# Patient Record
Sex: Female | Born: 1995 | Race: Black or African American | Hispanic: No | Marital: Single | Smoking: Never smoker
Health system: Southern US, Community
[De-identification: ages and names within clinical notes are randomized; demographics above are authoritative.]

## PROBLEM LIST (undated history)

## (undated) ENCOUNTER — Inpatient Hospital Stay (HOSPITAL_COMMUNITY): Payer: Self-pay

## (undated) DIAGNOSIS — J0391 Acute recurrent tonsillitis, unspecified: Secondary | ICD-10-CM

## (undated) DIAGNOSIS — Z309 Encounter for contraceptive management, unspecified: Secondary | ICD-10-CM

## (undated) DIAGNOSIS — O039 Complete or unspecified spontaneous abortion without complication: Secondary | ICD-10-CM

## (undated) DIAGNOSIS — Z3490 Encounter for supervision of normal pregnancy, unspecified, unspecified trimester: Principal | ICD-10-CM

## (undated) DIAGNOSIS — Z3491 Encounter for supervision of normal pregnancy, unspecified, first trimester: Principal | ICD-10-CM

## (undated) DIAGNOSIS — J40 Bronchitis, not specified as acute or chronic: Secondary | ICD-10-CM

## (undated) DIAGNOSIS — R569 Unspecified convulsions: Secondary | ICD-10-CM

## (undated) HISTORY — DX: Encounter for supervision of normal pregnancy, unspecified, first trimester: Z34.91

## (undated) HISTORY — DX: Encounter for contraceptive management, unspecified: Z30.9

## (undated) HISTORY — DX: Encounter for supervision of normal pregnancy, unspecified, unspecified trimester: Z34.90

## (undated) HISTORY — PX: HERNIA REPAIR: SHX51

---
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
## 2013-01-13 ENCOUNTER — Emergency Department (HOSPITAL_COMMUNITY)
Admission: EM | Admit: 2013-01-13 | Discharge: 2013-01-13 | Disposition: A | Discharge status: Home / Self Care | Payer: Medicaid Other | Attending: Emergency Medicine | Admitting: Emergency Medicine

## 2013-01-13 ENCOUNTER — Encounter (HOSPITAL_COMMUNITY): Payer: Self-pay | Admitting: *Deleted

## 2013-01-13 DIAGNOSIS — R000 Tachycardia, unspecified: Secondary | ICD-10-CM | POA: Insufficient documentation

## 2013-01-13 DIAGNOSIS — J039 Acute tonsillitis, unspecified: Secondary | ICD-10-CM | POA: Insufficient documentation

## 2013-01-13 DIAGNOSIS — R509 Fever, unspecified: Secondary | ICD-10-CM | POA: Insufficient documentation

## 2013-01-13 DIAGNOSIS — R599 Enlarged lymph nodes, unspecified: Secondary | ICD-10-CM | POA: Insufficient documentation

## 2013-01-13 LAB — RAPID STREP SCREEN (MED CTR MEBANE ONLY): Streptococcus, Group A Screen (Direct): NEGATIVE

## 2013-01-13 MED ORDER — PENICILLIN V POTASSIUM 250 MG/5ML PO SOLR: 500.0000 mg | Freq: Three times a day (TID) | ORAL | Status: AC

## 2013-01-13 NOTE — ED Notes (Signed)
Sore throat for 2-3 days. Alert.

## 2013-01-13 NOTE — ED Provider Notes (Signed)
History     CSN: [id number]  Arrival date & time 01/13/13  1734   First MD Initiated Contact with Patient 01/13/13 1920      Chief Complaint  Patient presents with  . Sore Throat    (Consider location/radiation/quality/duration/timing/severity/associated sxs/prior treatment) Patient is a 17 y.o. female presenting with pharyngitis. The history is provided by the patient.  Sore Throat This is a new problem. The current episode started in the past 7 days. The problem occurs constantly. The problem has been gradually worsening. Associated symptoms include chills, a fever, a sore throat and swollen glands. Pertinent negatives include no abdominal pain, anorexia, change in bowel habit, chest pain, congestion, coughing, headaches, nausea, neck pain, rash or vomiting. The symptoms are aggravated by eating.    History reviewed. No pertinent past medical history.  Past Surgical History  Procedure Laterality Date  . Hernia repair      History reviewed. No pertinent family history.  History  Substance Use Topics  . Smoking status: Never Smoker   . Smokeless tobacco: Not on file  . Alcohol Use: No    OB History   Grav Para Term Preterm Abortions TAB SAB Ect Mult Living                  Review of Systems  Constitutional: Positive for fever and chills.  HENT: Positive for sore throat. Negative for congestion, neck pain and neck stiffness.   Respiratory: Negative for cough.   Cardiovascular: Negative for chest pain.  Gastrointestinal: Negative for nausea, vomiting, abdominal pain, anorexia and change in bowel habit.  Genitourinary: Negative for urgency and frequency.  Musculoskeletal: Negative for back pain.  Skin: Negative for rash.  Neurological: Negative for headaches.  Psychiatric/Behavioral: The patient is not nervous/anxious.     Allergies  Review of patient's allergies indicates no known allergies.  Home Medications  No current outpatient prescriptions on file.  BP  122/73  Pulse 129  Temp(Src) 100.7 F (38.2 C) (Oral)  Resp 20  Ht 5\' 5"  (1.651 m)  Wt 155 lb (70.308 kg)  BMI 25.79 kg/m2  SpO2 100%  LMP 12/27/2012  Physical Exam  Nursing note and vitals reviewed. Constitutional: She is oriented to person, place, and time. She appears well-developed and well-nourished. No distress.  HENT:  Head: Normocephalic and atraumatic.  Right Ear: Tympanic membrane normal.  Left Ear: Tympanic membrane normal.  Nose: Nose normal.  Mouth/Throat: Uvula is midline and mucous membranes are normal. Oropharyngeal exudate and posterior oropharyngeal erythema present.  Tonsils enlarged with exudate  Eyes: EOM are normal.  Neck: Neck supple.  Cardiovascular: Tachycardia present.   Pulmonary/Chest: Effort normal and breath sounds normal.  Abdominal: Soft. There is no tenderness.  Musculoskeletal: Normal range of motion.  Neurological: She is alert and oriented to person, place, and time. No cranial nerve deficit.  Skin: Skin is warm and dry.  Psychiatric: She has a normal mood and affect. Her behavior is normal.    ED Course  Procedures (including critical care time) Results for orders placed during the hospital encounter of 01/13/13 (from the past 24 hour(s))  RAPID STREP SCREEN     Status: None   Collection Time    01/13/13  5:57 PM      Result Value Range   Streptococcus, Group A Screen (Direct) NEGATIVE  NEGATIVE     MDM  17 y.o. female with sore throat x 2 days with fever. I have reviewed this patient's vital signs, nurses notes, labs and  discussed findings with the patient and her mother. Discussed with the patient plan of care and all questioned fully answered. I discussed that the rapid strep was negative but it would be sent for culture. Will start antibiotics since patient has exudate and fever. I discussed with the patient that if symptoms persist she may need to be tested for Mono.  She will return if any problems arise.  She request liquid  antibiotics. She will take liquid motrin for pain and fever.   Medication List    TAKE these medications       penicillin v potassium 250 MG/5ML solution  Commonly known as:  VEETID  Take 10 mLs (500 mg total) by mouth 3 (three) times daily.            [facility] Orlene Och, [place] 01/13/13 1941

## 2013-01-14 NOTE — ED Provider Notes (Signed)
Medical screening examination/treatment/procedure(s) were performed by non-physician practitioner and as supervising physician I was immediately available for consultation/collaboration.  Donnetta Hutching, MD 01/14/13 [phone]

## 2013-01-16 LAB — CULTURE, GROUP A STREP

## 2013-01-17 NOTE — ED Notes (Signed)
Post ED Visit - Positive Culture Follow-up  Culture report reviewed by antimicrobial stewardship pharmacist: []  Wes Dulaney, Pharm.D., BCPS []  Celedonio Miyamoto, Pharm.D., BCPS [x]  Georgina Pillion, Pharm.D., BCPS []  [place], [address].D., BCPS, AAHIVP []  Estella Husk, Pharm.D., BCPS, AAHIVP  Positive Group A strep culture Treated with PCN V Potassium organism sensitive to the same and no further patient follow-up is required at this time.  Larena Sox 01/17/2013, 1:44 PM

## 2013-09-21 ENCOUNTER — Emergency Department (HOSPITAL_COMMUNITY)
Admission: EM | Admit: 2013-09-21 | Discharge: 2013-09-21 | Disposition: A | Discharge status: Home / Self Care | Payer: Medicaid Other | Attending: Emergency Medicine | Admitting: Emergency Medicine

## 2013-09-21 ENCOUNTER — Encounter (HOSPITAL_COMMUNITY): Payer: Self-pay | Admitting: Emergency Medicine

## 2013-09-21 DIAGNOSIS — J039 Acute tonsillitis, unspecified: Secondary | ICD-10-CM

## 2013-09-21 MED ORDER — AMOXICILLIN 250 MG/5ML PO SUSR
500.0000 mg | Freq: Once | ORAL | Status: AC
  Administered 2013-09-21: 500 mg via ORAL
  Filled 2013-09-21: qty 10

## 2013-09-21 MED ORDER — AMOXICILLIN 250 MG/5ML PO SUSR: 500.0000 mg | Freq: Three times a day (TID) | ORAL | Status: AC

## 2013-09-21 NOTE — ED Notes (Signed)
Pt c/o sore throat that started a few days ago, denies any fever,

## 2013-09-21 NOTE — Discharge Instructions (Signed)
Tonsillitis °Tonsillitis is an infection of the throat. This infection causes the tonsils to become red, tender, and puffy (swollen). Tonsils are groups of tissue at the back of your throat. If bacteria caused your infection, antibiotic medicine will be given to you. Sometimes symptoms of tonsillitis can be relieved with the use of steroid medicine. If your tonsillitis is severe and happens often, you may need to get your tonsils removed (tonsillectomy). °HOME CARE  °· Rest and sleep often. °· Drink enough fluids to keep your pee (urine) clear or pale yellow. °· While your throat is sore, eat soft or liquid foods like: °· Soup. °· Ice cream. °· Instant breakfast drinks. °· Eat frozen ice pops. °· Gargle with a warm or cold liquid to help soothe the throat. Gargle with a water and salt mix. Mix 1/4 teaspoon of salt and 1/4 teaspoon of baking soda in 1 cup of water. °· Only take medicines as told by your doctor. °· If you are given medicines (antibiotics), take them as told. Finish them even if you start to feel better. °GET HELP RIGHT AWAY IF:  °· You throw up (vomit). °· You have a very bad headache. °· You have a stiff neck. °· You have chest pain. °· You have trouble breathing or swallowing. °· You have bad throat pain, drooling, or your voice changes. °· You have bad pain not helped by medicine. °· You cannot fully open your mouth. °· You have redness, puffiness, or bad pain in the neck. °· You have a fever. °· You have a rash. °· You cough up green, yellow-brown, or bloody fluid. °· You cannot swallow liquids or food for 24 hours. °· You notice that only one of your tonsils is swollen. °MAKE SURE YOU:  °· Understand these instructions. °· Will watch your condition. °· Will get help right away if you are not doing well or get worse. °Document Released: 01/17/2008 Document Revised: 04/02/2013 Document Reviewed: 01/17/2013 °Exit[employer]® Patient Information ©2014 [employer]. ° °

## 2013-09-21 NOTE — ED Provider Notes (Signed)
CSN: [id number]     Arrival date & time 09/21/13  1613 History  This chart was scribed for non-physician practitioner, Evalee Jefferson, PA-C,working with Ezequiel Essex, MD, by Marlowe Kays, ED Scribe.  This patient was seen in room APFT22/APFT[facility] and the patient's care was started at 4:49 PM.  Chief Complaint  Patient presents with  . Sore Throat   The history is provided by the patient. No language interpreter was used.   HPI Comments:  Joan Andrews is a 18 y.o. female who presents to the Emergency Department complaining of scratchy sore throat that start approximately two days ago. Pt denies any sick contacts. Pt states she has taken some liquid OTC Tylenol with moderate relief. Pt states she has been eating and drinking normally. She denies coughing,shortness of breath, chest pain, headache, neck pain or stiffness,  fever, ear pain, or dental pain. She denies any chronic medical issues and is generally healthy.   History reviewed. No pertinent past medical history. Past Surgical History  Procedure Laterality Date  . Hernia repair     No family history on file. History  Substance Use Topics  . Smoking status: Never Smoker   . Smokeless tobacco: Not on file  . Alcohol Use: No   OB History   Grav Para Term Preterm Abortions TAB SAB Ect Mult Living                 Review of Systems  Constitutional: Negative for fever and appetite change.  HENT: Positive for sore throat. Negative for congestion.   Eyes: Negative.   Respiratory: Negative for chest tightness and shortness of breath.   Cardiovascular: Negative for chest pain.  Gastrointestinal: Negative for nausea and abdominal pain.  Genitourinary: Negative.   Musculoskeletal: Negative for arthralgias, joint swelling and neck pain.  Skin: Negative.  Negative for rash and wound.  Neurological: Negative for dizziness, weakness, light-headedness, numbness and headaches.  Psychiatric/Behavioral: Negative.   All other systems  reviewed and are negative.    Allergies  Review of patient's allergies indicates no known allergies.  Home Medications   Current Outpatient Rx  Name  Route  Sig  Dispense  Refill  . amoxicillin (AMOXIL) 250 MG/5ML suspension   Oral   Take 10 mLs (500 mg total) by mouth 3 (three) times daily.   300 mL   0    Triage Vitals: BP 135/93  Pulse 110  Temp(Src) 98.1 F (36.7 C) (Oral)  Resp 20  Ht 5\' 5"  (1.651 m)  Wt 167 lb (75.751 kg)  BMI 27.79 kg/m2  SpO2 99%  LMP 09/15/2013 Physical Exam  Nursing note and vitals reviewed. Constitutional: She appears well-developed and well-nourished.  HENT:  Head: Normocephalic and atraumatic.  Right Ear: Hearing, tympanic membrane, external ear and ear canal normal.  Left Ear: Hearing, tympanic membrane, external ear and ear canal normal.  Nose: Nose normal. No mucosal edema or rhinorrhea.  Mouth/Throat: Uvula is midline and mucous membranes are normal. Oropharyngeal exudate and posterior oropharyngeal erythema present. No posterior oropharyngeal edema or tonsillar abscesses.  Tonsils equally edematous and erythematous bilaterally with exudate. Uvula midline, no peritonsillar abscess appreciated.    Eyes: Conjunctivae are normal.  Neck: Normal range of motion.  Cardiovascular: Normal rate, regular rhythm, normal heart sounds and intact distal pulses.   Pulmonary/Chest: Effort normal and breath sounds normal. She has no wheezes.  Abdominal: Soft. Bowel sounds are normal. There is no tenderness.  Musculoskeletal: Normal range of motion.  Lymphadenopathy:  Head (right side): Tonsillar adenopathy present.       Head (left side): Tonsillar adenopathy present.  Neurological: She is alert.  Skin: Skin is warm and dry.  Psychiatric: She has a normal mood and affect.    ED Course  Procedures (including critical care time) DIAGNOSTIC STUDIES: Oxygen Saturation is 99% on RA, normal by my interpretation.   COORDINATION OF CARE: 4:56  PM- Will prescribe Amoxicillin. Pt verbalizes understanding and agrees to plan.  Medications  amoxicillin (AMOXIL) 250 MG/5ML suspension 500 mg (500 mg Oral Given 09/21/13 1700)    Labs Review Labs Reviewed - No data to display Imaging Review No results found.  EKG Interpretation   None       MDM   1. Tonsillitis with exudate    Pt reports unable to tolerate swallowing pills (chronically).  She was given liquid amoxil , prescription for same.  Encouraged rest, increased fluid intake,  Tylenol or motrin for pain reduction.  Recheck for any worsened sx.  Pt understands plan as do family at bedside.  I personally performed the services described in this documentation, which was scribed in my presence. The recorded information has been reviewed and is accurate.    Evalee Jefferson, PA-C 09/23/13 1500

## 2013-09-24 NOTE — ED Provider Notes (Signed)
Medical screening examination/treatment/procedure(s) were performed by non-physician practitioner and as supervising physician I was immediately available for consultation/collaboration.  EKG Interpretation   None        Ezequiel Essex, MD 09/24/13 1028

## 2013-12-08 ENCOUNTER — Encounter: Payer: Self-pay | Admitting: Adult Health

## 2013-12-26 ENCOUNTER — Encounter: Payer: Self-pay | Admitting: Adult Health

## 2013-12-26 ENCOUNTER — Ambulatory Visit (INDEPENDENT_AMBULATORY_CARE_PROVIDER_SITE_OTHER): Payer: Medicaid Other | Admitting: Adult Health

## 2013-12-26 ENCOUNTER — Other Ambulatory Visit (HOSPITAL_COMMUNITY)
Admission: RE | Admit: 2013-12-26 | Discharge: 2013-12-26 | Disposition: A | Discharge status: Home / Self Care | Payer: Medicaid Other | Source: Ambulatory Visit | Attending: Adult Health | Admitting: Adult Health

## 2013-12-26 VITALS — BP 112/70 | HR 82 | Ht 64.5 in | Wt 159.5 lb

## 2013-12-26 DIAGNOSIS — Z3049 Encounter for surveillance of other contraceptives: Secondary | ICD-10-CM

## 2013-12-26 DIAGNOSIS — Z1151 Encounter for screening for human papillomavirus (HPV): Secondary | ICD-10-CM | POA: Insufficient documentation

## 2013-12-26 DIAGNOSIS — R87810 Cervical high risk human papillomavirus (HPV) DNA test positive: Secondary | ICD-10-CM | POA: Insufficient documentation

## 2013-12-26 DIAGNOSIS — Z01419 Encounter for gynecological examination (general) (routine) without abnormal findings: Secondary | ICD-10-CM

## 2013-12-26 DIAGNOSIS — Z124 Encounter for screening for malignant neoplasm of cervix: Secondary | ICD-10-CM | POA: Insufficient documentation

## 2013-12-26 DIAGNOSIS — Z309 Encounter for contraceptive management, unspecified: Secondary | ICD-10-CM

## 2013-12-26 DIAGNOSIS — Z113 Encounter for screening for infections with a predominantly sexual mode of transmission: Secondary | ICD-10-CM | POA: Insufficient documentation

## 2013-12-26 HISTORY — DX: Encounter for contraceptive management, unspecified: Z30.9

## 2013-12-26 MED ORDER — NORELGESTROMIN-ETH ESTRADIOL 150-35 MCG/24HR TD PTWK: 1.0000 | MEDICATED_PATCH | TRANSDERMAL | Status: DC

## 2013-12-26 NOTE — Patient Instructions (Signed)

## 2013-12-26 NOTE — Progress Notes (Signed)
Patient ID: Joan Andrews, female   DOB: 29-Jan-1996, 18 y.o.   MRN: [id number] History of Present Illness:  Joan Andrews is a 18 year old black female, single in for a pap and physical with family planning medicaid and she wants to get on the patch for birth control.She is new to this practice.  Current Medications, Allergies, Past Medical History, Past Surgical History, Family History and Social History were reviewed in [employer] record.     Review of Systems: Patient denies any headaches, blurred vision, shortness of breath, chest pain, abdominal pain, problems with bowel movements, urination, or intercourse. No joint pain or mood swings.    Physical Exam:BP 112/70  Pulse 82  Ht 5' 4.5" (1.638 m)  Wt 159 lb 8 oz (72.349 kg)  BMI 26.97 kg/m2  LMP 12/05/2013 General:  Well developed, well nourished, no acute distress Skin:  Warm and dry Neck:  Midline trachea, normal thyroid Lungs; Clear to auscultation bilaterally Breast:  No dominant palpable mass, retraction, or nipple discharge Cardiovascular: Regular rate and rhythm Abdomen:  Soft, non tender, no hepatosplenomegaly, has scar at navel where had hernia repair. Pelvic:  External genitalia is normal in appearance.  The vagina is normal in appearance. The cervix is smooth and tiny, pap with G/CHL performed.Marland Kitchen  Uterus is felt to be normal size, shape, and contour.  No  adnexal masses or tenderness noted. Extremities:  No swelling or varicosities noted Psych:  No mood changes, alert and cooperative, seems happy Discussed the patch and other Birth control options and she wants the patch, she is aware of risk and benefits.   Impression: Yearly gyn exam family planning medicaid Contraceptive mangement STD testing    Plan: Check HIV,RPR,HSV 2 Rx ortho evra patch x 1 year Follow up in 3 months Physical in 1 year Review handout on patch

## 2013-12-27 LAB — HIV ANTIBODY (ROUTINE TESTING W REFLEX): HIV 1&2 Ab, 4th Generation: NONREACTIVE

## 2013-12-27 LAB — RPR

## 2013-12-29 LAB — HSV 2 ANTIBODY, IGG

## 2013-12-31 ENCOUNTER — Telehealth: Payer: Self-pay

## 2013-12-31 NOTE — Telephone Encounter (Signed)
Spoke with pt. All labs were negative. Pt wants to come by and get a copy for work. Advised can get that at the front desk. [place]

## 2014-01-06 ENCOUNTER — Telehealth: Payer: Self-pay | Admitting: Adult Health

## 2014-01-06 NOTE — Telephone Encounter (Signed)
Left message to call about.

## 2014-01-12 ENCOUNTER — Telehealth: Payer: Self-pay

## 2014-01-12 NOTE — Telephone Encounter (Signed)
Pt states started new patch for birth control 12/26/2013 now having vaginal bleeding. Informed pt can have breakthrough bleeding when starting a new birth control continue to monitor if no improvement call our office back. Pt verbalized understanding.

## 2014-01-19 ENCOUNTER — Telehealth: Payer: Self-pay | Admitting: *Deleted

## 2014-01-19 NOTE — Telephone Encounter (Signed)
Pt states that this is her first time on [facility] and she is on the patch. Pt states that she has been bleeding for a little over 2 weeks. Pt saw JAG. PT advised that I would speak to [place] and then call her back. Pt verblaized understanding.

## 2014-01-19 NOTE — Telephone Encounter (Signed)
LMOM x1 AMT

## 2014-01-19 NOTE — Telephone Encounter (Signed)
I spoke with JAG and she advised that the pt should change the patch every week for 3 weeks and ten have no patch for a week, place the new patch on at the end of her period and then repeat the process. JAG also advised that the pt should use the patches for at least 3 months and then we would make a decision. I will call and inform the pt of this.   Pt advised of above and she verbalized understanding.

## 2014-03-31 ENCOUNTER — Ambulatory Visit: Payer: Medicaid Other | Admitting: Adult Health

## 2014-04-14 ENCOUNTER — Emergency Department (HOSPITAL_COMMUNITY)
Admission: EM | Admit: 2014-04-14 | Discharge: 2014-04-14 | Disposition: A | Discharge status: Home / Self Care | Payer: Medicaid Other | Attending: Emergency Medicine | Admitting: Emergency Medicine

## 2014-04-14 ENCOUNTER — Encounter (HOSPITAL_COMMUNITY): Payer: Self-pay | Admitting: Emergency Medicine

## 2014-04-14 DIAGNOSIS — J029 Acute pharyngitis, unspecified: Secondary | ICD-10-CM | POA: Insufficient documentation

## 2014-04-14 DIAGNOSIS — J039 Acute tonsillitis, unspecified: Secondary | ICD-10-CM

## 2014-04-14 LAB — CBC WITH DIFFERENTIAL/PLATELET
Basophils Absolute: 0 10*3/uL (ref 0.0–0.1)
Basophils Relative: 0 % (ref 0–1)
Eosinophils Absolute: 0 10*3/uL (ref 0.0–0.7)
Eosinophils Relative: 0 % (ref 0–5)
HEMATOCRIT: 31.5 % — AB (ref 36.0–46.0)
HEMOGLOBIN: 10.7 g/dL — AB (ref 12.0–15.0)
LYMPHS ABS: 1.4 10*3/uL (ref 0.7–4.0)
LYMPHS PCT: 11 % — AB (ref 12–46)
MCH: 27.6 pg (ref 26.0–34.0)
MCHC: 34 g/dL (ref 30.0–36.0)
MCV: 81.2 fL (ref 78.0–100.0)
MONO ABS: 1.1 10*3/uL — AB (ref 0.1–1.0)
MONOS PCT: 8 % (ref 3–12)
NEUTROS ABS: 10.5 10*3/uL — AB (ref 1.7–7.7)
NEUTROS PCT: 81 % — AB (ref 43–77)
Platelets: 189 10*3/uL (ref 150–400)
RBC: 3.88 MIL/uL (ref 3.87–5.11)
RDW: 13.7 % (ref 11.5–15.5)
WBC: 13 10*3/uL — AB (ref 4.0–10.5)

## 2014-04-14 LAB — RAPID STREP SCREEN (MED CTR MEBANE ONLY): Streptococcus, Group A Screen (Direct): NEGATIVE

## 2014-04-14 LAB — MONONUCLEOSIS SCREEN: Mono Screen: NEGATIVE

## 2014-04-14 MED ORDER — CEPHALEXIN 500 MG PO CAPS: 500.0000 mg | ORAL_CAPSULE | Freq: Four times a day (QID) | ORAL | Status: DC

## 2014-04-14 MED ORDER — ACETAMINOPHEN 160 MG/5ML PO SOLN
650.0000 mg | Freq: Once | ORAL | Status: AC
  Administered 2014-04-14: 650 mg via ORAL
  Filled 2014-04-14: qty 20.3

## 2014-04-14 MED ORDER — PREDNISONE 10 MG PO TABS: 20.0000 mg | ORAL_TABLET | Freq: Two times a day (BID) | ORAL | Status: DC

## 2014-04-14 NOTE — ED Notes (Signed)
sorethroat for 3 days, seen @ [facility] yesterday and dx/d with pharyngitis, now has yellow spots on throat

## 2014-04-14 NOTE — ED Notes (Signed)
Vital signs remove put in under wrong patient.

## 2014-04-14 NOTE — Discharge Instructions (Signed)
Keflex as prescribed. Prednisone as prescribed.  Ibuprofen 600 mg every 6 hours as needed for pain.  Return to the emergency department if you develop severe pain, difficulty breathing, or an inability to swallow.   Tonsillitis Tonsillitis is an infection of the throat that causes the tonsils to become red, tender, and swollen. Tonsils are collections of lymphoid tissue at the back of the throat. Each tonsil has crevices (crypts). Tonsils help fight nose and throat infections and keep infection from spreading to other parts of the body for the first 18 months of life.  CAUSES Sudden (acute) tonsillitis is usually caused by infection with streptococcal bacteria. Long-lasting (chronic) tonsillitis occurs when the crypts of the tonsils become filled with pieces of food and bacteria, which makes it easy for the tonsils to become repeatedly infected. SYMPTOMS  Symptoms of tonsillitis include:  A sore throat, with possible difficulty swallowing.  White patches on the tonsils.  Fever.  Tiredness.  New episodes of snoring during sleep, when you did not snore before.  Small, foul-smelling, yellowish-white pieces of material (tonsilloliths) that you occasionally cough up or spit out. The tonsilloliths can also cause you to have bad breath. DIAGNOSIS Tonsillitis can be diagnosed through a physical exam. Diagnosis can be confirmed with the results of lab tests, including a throat culture. TREATMENT  The goals of tonsillitis treatment include the reduction of the severity and duration of symptoms and prevention of associated conditions. Symptoms of tonsillitis can be improved with the use of steroids to reduce the swelling. Tonsillitis caused by bacteria can be treated with antibiotic medicines. Usually, treatment with antibiotic medicines is started before the cause of the tonsillitis is known. However, if it is determined that the cause is not bacterial, antibiotic medicines will not treat the  tonsillitis. If attacks of tonsillitis are severe and frequent, your health care provider may recommend surgery to remove the tonsils (tonsillectomy). HOME CARE INSTRUCTIONS   Rest as much as possible and get plenty of sleep.  Drink plenty of fluids. While the throat is very sore, eat soft foods or liquids, such as sherbet, soups, or instant breakfast drinks.  Eat frozen ice pops.  Gargle with a warm or cold liquid to help soothe the throat. Mix 1/4 teaspoon of salt and 1/4 teaspoon of baking soda in 8 oz of water. SEEK MEDICAL CARE IF:   Large, tender lumps develop in your neck.  A rash develops.  A green, yellow-brown, or bloody substance is coughed up.  You are unable to swallow liquids or food for 24 hours.  You notice that only one of the tonsils is swollen. SEEK IMMEDIATE MEDICAL CARE IF:   You develop any new symptoms such as vomiting, severe headache, stiff neck, chest pain, or trouble breathing or swallowing.  You have severe throat pain along with drooling or voice changes.  You have severe pain, unrelieved with recommended medications.  You are unable to fully open the mouth.  You develop redness, swelling, or severe pain anywhere in the neck.  You have a fever. MAKE SURE YOU:   Understand these instructions.  Will watch your condition.  Will get help right away if you are not doing well or get worse. Document Released: 05/10/2005 Document Revised: 12/15/2013 Document Reviewed: 01/17/2013 [facility] Patient Information 2015 [place], [place]. This information is not intended to replace advice given to you by your health care provider. Make sure you discuss any questions you have with your health care provider.

## 2014-04-14 NOTE — ED Provider Notes (Signed)
CSN: [id number]     Arrival date & time 04/14/14  0444 History   First MD Initiated Contact with Patient 04/14/14 [phone]     Chief Complaint  Patient presents with  . Sore Throat     (Consider location/radiation/quality/duration/timing/severity/associated sxs/prior Treatment) HPI Comments: ST for the past 3 days.  Was seen at [facility] yesterday and had a negative strep test.  Since then, she is now having white spots on the tonsils.  Patient is a 18 y.o. female presenting with pharyngitis. The history is provided by the patient.  Sore Throat This is a new problem. Episode onset: 3 days ago. The problem occurs constantly. The problem has been gradually worsening. Pertinent negatives include no chest pain and no abdominal pain. The symptoms are aggravated by swallowing, drinking and eating. Nothing relieves the symptoms. She has tried nothing for the symptoms. The treatment provided no relief.    Past Medical History  Diagnosis Date  . Contraceptive management 12/26/2013   Past Surgical History  Procedure Laterality Date  . Hernia repair     Family History  Problem Relation Age of Onset  . Hypertension Father   . Alzheimer's disease Maternal Grandmother    History  Substance Use Topics  . Smoking status: Never Smoker   . Smokeless tobacco: Never Used  . Alcohol Use: No   OB History   Grav Para Term Preterm Abortions TAB SAB Ect Mult Living   0              Review of Systems  Cardiovascular: Negative for chest pain.  Gastrointestinal: Negative for abdominal pain.  All other systems reviewed and are negative.     Allergies  Review of patient's allergies indicates no known allergies.  Home Medications   Prior to Admission medications   Medication Sig Start Date End Date Taking? Authorizing Provider  norelgestromin-ethinyl estradiol (ORTHO EVRA) 150-35 MCG/24HR transdermal patch Place 1 patch onto the skin once a week. 12/26/13   Estill Dooms, NP   BP 171/105   Pulse 103  Temp(Src) 98.4 F (36.9 C) (Oral)  Resp 20  Ht 5\' 9"  (1.753 m)  Wt 139 lb (63.05 kg)  BMI 20.52 kg/m2  SpO2 93%  LMP 04/09/2014 Physical Exam  Nursing note and vitals reviewed. Constitutional: She is oriented to person, place, and time. She appears well-developed and well-nourished. No distress.  HENT:  Head: Normocephalic and atraumatic.  The PO is erythematous with exudates present to both tonsils.  Neck: Normal range of motion. Neck supple.  Cardiovascular: Normal rate and regular rhythm.  Exam reveals no gallop and no friction rub.   No murmur heard. Pulmonary/Chest: Effort normal and breath sounds normal. No respiratory distress. She has no wheezes.  Abdominal: Soft. Bowel sounds are normal. She exhibits no distension. There is no tenderness.  Musculoskeletal: Normal range of motion.  Lymphadenopathy:    She has cervical adenopathy.  Neurological: She is alert and oriented to person, place, and time.  Skin: Skin is warm and dry. She is not diaphoretic.    ED Course  Procedures (including critical care time) Labs Review Labs Reviewed  RAPID STREP SCREEN  CBC WITH DIFFERENTIAL  MONONUCLEOSIS SCREEN    Imaging Review No results found.   EKG Interpretation None      MDM   Final diagnoses:  None    Patient presents with complaints of sore throat. She was seen at [facility] yesterday and strep test was negative. She returns today with exudates in  her throat. Repeat strep test was negative and mono test was negative as well. She appears to have tonsillitis, likely viral but possibly bacterial. She will be treated with Keflex and when necessary return.    Veryl Speak, MD 04/14/14 [phone]

## 2014-04-16 LAB — CULTURE, GROUP A STREP

## 2014-07-14 ENCOUNTER — Ambulatory Visit (INDEPENDENT_AMBULATORY_CARE_PROVIDER_SITE_OTHER): Payer: Self-pay | Admitting: Adult Health

## 2014-07-14 ENCOUNTER — Encounter: Payer: Self-pay | Admitting: Adult Health

## 2014-07-14 VITALS — BP 110/60 | Ht 65.5 in | Wt 160.0 lb

## 2014-07-14 DIAGNOSIS — Z3490 Encounter for supervision of normal pregnancy, unspecified, unspecified trimester: Secondary | ICD-10-CM

## 2014-07-14 DIAGNOSIS — Z3201 Encounter for pregnancy test, result positive: Secondary | ICD-10-CM

## 2014-07-14 LAB — POCT URINE PREGNANCY: PREG TEST UR: POSITIVE

## 2014-07-14 NOTE — Progress Notes (Signed)
Subjective:     Patient ID: Joan Andrews, female   DOB: 04-04-1996, 18 y.o.   MRN: [id number]  HPI Joan Andrews is a 18 year old black female, engaged in for UPT.  Review of Systems See HPI Reviewed past medical,surgical, social and family history. Reviewed medications and allergies.     Objective:   Physical Exam BP 110/60 mmHg  Ht 5' 5.5" (1.664 m)  Wt 160 lb (72.576 kg)  BMI 26.21 kg/m2  LMP 06/07/2014   UPT+, about 5+2 weeks by LMP with EDD 03/15/15, medicaid form given, no complaints  Assessment:     Pregnant +UPT    Plan:     Take Flintstones complete with iron 1 bid Follow up in 2 weeks for dating [place] Get pregnancy medicaid Review handout on first trimester

## 2014-07-14 NOTE — Patient Instructions (Signed)
First Trimester of Pregnancy The first trimester of pregnancy is from week 1 until the end of week 12 (months 1 through 3). A week after a sperm fertilizes an egg, the egg will implant on the wall of the uterus. This embryo will begin to develop into a baby. Genes from you and your partner are forming the baby. The female genes determine whether the baby is a boy or a girl. At 6-8 weeks, the eyes and face are formed, and the heartbeat can be seen on ultrasound. At the end of 12 weeks, all the baby's organs are formed.  Now that you are pregnant, you will want to do everything you can to have a healthy baby. Two of the most important things are to get good prenatal care and to follow your health care provider's instructions. Prenatal care is all the medical care you receive before the baby's birth. This care will help prevent, find, and treat any problems during the pregnancy and childbirth. BODY CHANGES Your body goes through many changes during pregnancy. The changes vary from woman to woman.   You may gain or lose a couple of pounds at first.  You may feel sick to your stomach (nauseous) and throw up (vomit). If the vomiting is uncontrollable, call your health care provider.  You may tire easily.  You may develop headaches that can be relieved by medicines approved by your health care provider.  You may urinate more often. Painful urination may mean you have a bladder infection.  You may develop heartburn as a result of your pregnancy.  You may develop constipation because certain hormones are causing the muscles that push waste through your intestines to slow down.  You may develop hemorrhoids or swollen, bulging veins (varicose veins).  Your breasts may begin to grow larger and become tender. Your nipples may stick out more, and the tissue that surrounds them (areola) may become darker.  Your gums may bleed and may be sensitive to brushing and flossing.  Dark spots or blotches (chloasma,  mask of pregnancy) may develop on your face. This will likely fade after the baby is born.  Your menstrual periods will stop.  You may have a loss of appetite.  You may develop cravings for certain kinds of food.  You may have changes in your emotions from day to day, such as being excited to be pregnant or being concerned that something may go wrong with the pregnancy and baby.  You may have more vivid and strange dreams.  You may have changes in your hair. These can include thickening of your hair, rapid growth, and changes in texture. Some women also have hair loss during or after pregnancy, or hair that feels dry or thin. Your hair will most likely return to normal after your baby is born. WHAT TO EXPECT AT YOUR PRENATAL VISITS During a routine prenatal visit:  You will be weighed to make sure you and the baby are growing normally.  Your blood pressure will be taken.  Your abdomen will be measured to track your baby's growth.  The fetal heartbeat will be listened to starting around week 10 or 12 of your pregnancy.  Test results from any previous visits will be discussed. Your health care provider may ask you:  How you are feeling.  If you are feeling the baby move.  If you have had any abnormal symptoms, such as leaking fluid, bleeding, severe headaches, or abdominal cramping.  If you have any questions. Other tests   that may be performed during your first trimester include:  Blood tests to find your blood type and to check for the presence of any previous infections. They will also be used to check for low iron levels (anemia) and Rh antibodies. Later in the pregnancy, blood tests for diabetes will be done along with other tests if problems develop.  Urine tests to check for infections, diabetes, or protein in the urine.  An ultrasound to confirm the proper growth and development of the baby.  An amniocentesis to check for possible genetic problems.  Fetal screens for  spina bifida and Down syndrome.  You may need other tests to make sure you and the baby are doing well. HOME CARE INSTRUCTIONS  Medicines  Follow your health care provider's instructions regarding medicine use. Specific medicines may be either safe or unsafe to take during pregnancy.  Take your prenatal vitamins as directed.  If you develop constipation, try taking a stool softener if your health care provider approves. Diet  Eat regular, well-balanced meals. Choose a variety of foods, such as meat or vegetable-based protein, fish, milk and low-fat dairy products, vegetables, fruits, and whole grain breads and cereals. Your health care provider will help you determine the amount of weight gain that is right for you.  Avoid raw meat and uncooked cheese. These carry germs that can cause birth defects in the baby.  Eating four or five small meals rather than three large meals a day may help relieve nausea and vomiting. If you start to feel nauseous, eating a few soda crackers can be helpful. Drinking liquids between meals instead of during meals also seems to help nausea and vomiting.  If you develop constipation, eat more high-fiber foods, such as fresh vegetables or fruit and whole grains. Drink enough fluids to keep your urine clear or pale yellow. Activity and Exercise  Exercise only as directed by your health care provider. Exercising will help you:  Control your weight.  Stay in shape.  Be prepared for labor and delivery.  Experiencing pain or cramping in the lower abdomen or low back is a good sign that you should stop exercising. Check with your health care provider before continuing normal exercises.  Try to avoid standing for long periods of time. Move your legs often if you must stand in one place for a long time.  Avoid heavy lifting.  Wear low-heeled shoes, and practice good posture.  You may continue to have sex unless your health care provider directs you  otherwise. Relief of Pain or Discomfort  Wear a good support bra for breast tenderness.   Take warm sitz baths to soothe any pain or discomfort caused by hemorrhoids. Use hemorrhoid cream if your health care provider approves.   Rest with your legs elevated if you have leg cramps or low back pain.  If you develop varicose veins in your legs, wear support hose. Elevate your feet for 15 minutes, 3-4 times a day. Limit salt in your diet. Prenatal Care  Schedule your prenatal visits by the twelfth week of pregnancy. They are usually scheduled monthly at first, then more often in the last 2 months before delivery.  Write down your questions. Take them to your prenatal visits.  Keep all your prenatal visits as directed by your health care provider. Safety  Wear your seat belt at all times when driving.  Make a list of emergency phone numbers, including numbers for family, friends, the hospital, and police and fire departments. General Tips    Ask your health care provider for a referral to a local prenatal education class. Begin classes no later than at the beginning of month 6 of your pregnancy.  Ask for help if you have counseling or nutritional needs during pregnancy. Your health care provider can offer advice or refer you to specialists for help with various needs.  Do not use hot tubs, steam rooms, or saunas.  Do not douche or use tampons or scented sanitary pads.  Do not cross your legs for long periods of time.  Avoid cat litter boxes and soil used by cats. These carry germs that can cause birth defects in the baby and possibly loss of the fetus by miscarriage or stillbirth.  Avoid all smoking, herbs, alcohol, and medicines not prescribed by your health care provider. Chemicals in these affect the formation and growth of the baby.  Schedule a dentist appointment. At home, brush your teeth with a soft toothbrush and be gentle when you floss. SEEK MEDICAL CARE IF:   You have  dizziness.  You have mild pelvic cramps, pelvic pressure, or nagging pain in the abdominal area.  You have persistent nausea, vomiting, or diarrhea.  You have a bad smelling vaginal discharge.  You have pain with urination.  You notice increased swelling in your face, hands, legs, or ankles. SEEK IMMEDIATE MEDICAL CARE IF:   You have a fever.  You are leaking fluid from your vagina.  You have spotting or bleeding from your vagina.  You have severe abdominal cramping or pain.  You have rapid weight gain or loss.  You vomit blood or material that looks like coffee grounds.  You are exposed to German measles and have never had them.  You are exposed to fifth disease or chickenpox.  You develop a severe headache.  You have shortness of breath.  You have any kind of trauma, such as from a fall or a car accident. Document Released: 07/25/2001 Document Revised: 12/15/2013 Document Reviewed: 06/10/2013 Exit[employer] Patient Information 2015 [employer]. This information is not intended to replace advice given to you by your health care provider. Make sure you discuss any questions you have with your health care provider. Return in 2 weeks for dating US 

## 2014-07-20 ENCOUNTER — Encounter: Payer: Self-pay | Admitting: *Deleted

## 2014-07-20 ENCOUNTER — Telehealth: Payer: Self-pay | Admitting: Women's Health

## 2014-07-20 NOTE — Telephone Encounter (Signed)
Note written and printed, pt in our office to pick up note now.

## 2014-07-21 ENCOUNTER — Encounter (HOSPITAL_COMMUNITY): Payer: Self-pay | Admitting: Emergency Medicine

## 2014-07-21 ENCOUNTER — Emergency Department (HOSPITAL_COMMUNITY)
Admission: EM | Admit: 2014-07-21 | Discharge: 2014-07-21 | Disposition: A | Discharge status: Home / Self Care | Payer: Medicaid Other | Attending: Emergency Medicine | Admitting: Emergency Medicine

## 2014-07-21 DIAGNOSIS — J029 Acute pharyngitis, unspecified: Secondary | ICD-10-CM | POA: Insufficient documentation

## 2014-07-21 LAB — RAPID STREP SCREEN (MED CTR MEBANE ONLY): Streptococcus, Group A Screen (Direct): NEGATIVE

## 2014-07-21 MED ORDER — DEXAMETHASONE 1 MG/ML PO CONC: 10.0000 mg | Freq: Once | ORAL | Status: DC

## 2014-07-21 MED ORDER — CHLORHEXIDINE GLUCONATE 0.12 % MT SOLN: 15.0000 mL | Freq: Two times a day (BID) | OROMUCOSAL | Status: DC

## 2014-07-21 MED ORDER — ACETAMINOPHEN 160 MG/5ML PO SOLN
650.0000 mg | Freq: Once | ORAL | Status: AC
  Administered 2014-07-21: 650 mg via ORAL
  Filled 2014-07-21: qty 20.3

## 2014-07-21 MED ORDER — ACETAMINOPHEN 325 MG PO TABS: 650.0000 mg | ORAL_TABLET | Freq: Once | ORAL | Status: DC

## 2014-07-21 MED ORDER — DEXAMETHASONE 10 MG/ML FOR PEDIATRIC ORAL USE
10.0000 mg | Freq: Once | INTRAMUSCULAR | Status: AC
  Administered 2014-07-21: 10 mg via ORAL
  Filled 2014-07-21: qty 1

## 2014-07-21 NOTE — ED Provider Notes (Signed)
CSN: [id number]     Arrival date & time 07/21/14  5277 History  This chart was scribed for Carmin Muskrat, MD by Tula Nakayama, ED Scribe. This patient was seen in room APA05/APA05 and the patient's care was started at 10:21 AM.    Chief Complaint  Patient presents with  . Sore Throat   The history is provided by the patient. No language interpreter was used.   HPI Comments: Joan Andrews is a 18 y.o. female who is currently [redacted] weeks pregnant and presents to the Emergency Department complaining of constant fever and sore throat that started last night. Pt notes exudate, mild swelling, throat discomfort, and difficulty swallowing as associated symptoms. She has tried Tylenol with no relief to symptoms. Pt has a history of tonsillitis and notes that last re-occurence was 3 months ago, but reports that she has not followed up with ENT. She denies chest pain, abdominal pain and difficulty breathing as associated symptoms. No pregnancy concerns, this is her first pregnancy. She is scheduled to see obstetrics in one week.   Past Medical History  Diagnosis Date  . Contraceptive management 12/26/2013  . Pregnant 07/14/2014   Past Surgical History  Procedure Laterality Date  . Hernia repair     Family History  Problem Relation Age of Onset  . Hypertension Father   . Alzheimer's disease Maternal Grandmother    History  Substance Use Topics  . Smoking status: Never Smoker   . Smokeless tobacco: Never Used  . Alcohol Use: No   OB History    Gravida Para Term Preterm AB TAB SAB Ectopic Multiple Living   1              Review of Systems  Constitutional: Positive for fever.       Per HPI, otherwise negative  HENT: Positive for sore throat.        Per HPI, otherwise negative  Respiratory: Negative for shortness of breath.        Per HPI, otherwise negative  Cardiovascular:       Per HPI, otherwise negative  Gastrointestinal: Negative for vomiting and abdominal pain.   Endocrine:       Negative aside from HPI  Genitourinary:       Neg aside from HPI   Musculoskeletal:       Per HPI, otherwise negative  Skin: Negative.   Neurological: Negative for syncope.   Allergies  Review of patient's allergies indicates no known allergies.  Home Medications   Prior to Admission medications   Not on File   Pulse 150  Temp(Src) 102.5 F (39.2 C) (Oral)  Ht 5\' 4"  (1.626 m)  Wt 160 lb (72.576 kg)  BMI 27.45 kg/m2  SpO2 100%  LMP 06/07/2014 Physical Exam  Constitutional: She is oriented to person, place, and time. She appears well-developed and well-nourished. No distress.  HENT:  Head: Normocephalic and atraumatic.  Mouth/Throat: Oropharyngeal exudate present.  Tonsillar exudate; no asymmetry; no uvular swelling  Eyes: Conjunctivae and EOM are normal.  Neck:  Mild lymphadenopathy  Cardiovascular: Regular rhythm.   Pulmonary/Chest: Effort normal and breath sounds normal. No stridor. No respiratory distress.  Abdominal: She exhibits no distension.  Musculoskeletal: She exhibits no edema.  Lymphadenopathy:    She has no cervical adenopathy.  Neurological: She is alert and oriented to person, place, and time. No cranial nerve deficit.  Skin: Skin is warm and dry.  Psychiatric: She has a normal mood and affect.  Nursing note and  vitals reviewed.   ED Course  Procedures (including critical care time) DIAGNOSTIC STUDIES: Oxygen Saturation is 100% on RA, normal by my interpretation.    COORDINATION OF CARE: 10:23 AM Discussed treatment plan with pt at bedside and pt agreed to plan.  Labs Review Labs Reviewed  RAPID STREP SCREEN  CULTURE, GROUP A STREP   culture sent   On repeat exam the patient is well-appearing, sitting upright, states that she feels substantially better. We discussed her recurrent pharyngitis, the need to follow-up with primary care. Given the patient's discomfort, she will receive Decadron. She will also follow up with  obstetrics, and has a appointment in one week.   MDM   Patient presents with sore throat, white discharge on the posterior oropharynx, but no evidence for abscess, impending respiratory compromise. Patient improved substantially here with fluids, Decadron, Tylenol. Initial test was negative, but culture will be sent for strep pharyngitis. She was discharged in stable condition with oral medication, primary care and obstetrics follow-up.   I personally performed the services described in this documentation, which was scribed in my presence. The recorded information has been reviewed and is accurate.      Carmin Muskrat, MD 07/21/14 1308

## 2014-07-21 NOTE — Care Management Note (Signed)
ED/CM noted patient did not have health insurance and/or PCP listed in the computer. Pt sees Dr Glo Herring for her pregnancy.  Patient was given the [facility] with information on the clinics, food pantries, and the handout for new health insurance sign-up. Patient expressed appreciation for information received.

## 2014-07-21 NOTE — ED Notes (Signed)
Pt c/o sore throat and fever since yesterday.  Denies any other symptoms.  Reports took tylenol for fever last night some time before midnight.

## 2014-07-21 NOTE — ED Notes (Signed)
Notice white patches to back of throat 2 days ago. Notice foul oral smell.  Took tylenol on last night.  Pt is [redacted] weeks pregnant.

## 2014-07-21 NOTE — Discharge Instructions (Signed)
As discussed, today's evaluation has been largely reassuring.  A secondary test is performed on your throat culture, to confirm the absence of strep throat.  As discussed, it is very important that you follow up with our specialist to ascertain why you have recurrent sore throat.  In addition, it is important to follow up with her obstetrics team, as scheduled.  Return here for any concerning changes in your condition.

## 2014-07-22 ENCOUNTER — Telehealth: Payer: Self-pay | Admitting: *Deleted

## 2014-07-22 LAB — US OB COMP LESS 14 WKS

## 2014-07-22 NOTE — Telephone Encounter (Signed)
Pt c/o of cough, sore throat, runny nose, no fever, [redacted] weeks pregnant. Per Mylo Red, CNM pt can use nasal spray and gargle with warm salt water. Pt verbalized understanding.

## 2014-07-23 LAB — CULTURE, GROUP A STREP

## 2014-07-29 ENCOUNTER — Other Ambulatory Visit: Payer: Self-pay

## 2014-07-29 ENCOUNTER — Other Ambulatory Visit: Payer: Self-pay | Admitting: Adult Health

## 2014-07-29 ENCOUNTER — Telehealth: Payer: Self-pay | Admitting: Advanced Practice Midwife

## 2014-07-29 ENCOUNTER — Ambulatory Visit (INDEPENDENT_AMBULATORY_CARE_PROVIDER_SITE_OTHER): Payer: Medicaid Other

## 2014-07-29 DIAGNOSIS — O200 Threatened abortion: Secondary | ICD-10-CM

## 2014-07-29 DIAGNOSIS — Z3401 Encounter for supervision of normal first pregnancy, first trimester: Secondary | ICD-10-CM

## 2014-07-29 DIAGNOSIS — O209 Hemorrhage in early pregnancy, unspecified: Secondary | ICD-10-CM

## 2014-07-29 DIAGNOSIS — O4691 Antepartum hemorrhage, unspecified, first trimester: Secondary | ICD-10-CM

## 2014-07-29 DIAGNOSIS — Z3490 Encounter for supervision of normal pregnancy, unspecified, unspecified trimester: Secondary | ICD-10-CM

## 2014-07-29 NOTE — Addendum Note (Signed)
Addended by: Traci Sermon A on: 07/29/2014 11:17 AM   Modules accepted: Orders

## 2014-07-29 NOTE — Telephone Encounter (Signed)
Pt informed that too early to see anything on ultrasound, we are waiting for blood work to come back but to keep taking PNV.  Pt verbalized understanding.

## 2014-07-29 NOTE — Progress Notes (Signed)
U/S-No gestational Sac noted on trans-abdominal or trans-vaginal ultrasound performed, Pt states Vaginal Bleeding X 1 week which stopped yesterday, Pt had went TO [facility] when bleeding began (not sure of date)-had an ultrasound and labs performed, no results given to patient by ED per PT, We have requested the records from [place], [place] with [place] pertaining this patient, [facility] ordered [place] and ABO/RH on patient and will call patient with the results. Today's ultrasound revealed-thickened endometrium=10.90mm with debris noted within the cavity, No GS noted, cx appears closed, bilateral adnexa appears WNL, no free fluid or adnexal masses noted within the pelvis

## 2014-07-30 ENCOUNTER — Telehealth: Payer: Self-pay | Admitting: Adult Health

## 2014-07-30 LAB — ABO AND RH: RH TYPE: POSITIVE

## 2014-07-30 LAB — HCG, QUANTITATIVE, PREGNANCY: hCG, Beta Chain, Quant, S: 126 m[IU]/mL

## 2014-07-30 NOTE — Telephone Encounter (Signed)
Pt aware that [facility] has dropped from 14,197 on 12/9 to 126 yesterday and blood type O+, make appt for 2 weeks to check [facility], use condoms,she is aware this was a miscarriage

## 2014-08-13 ENCOUNTER — Other Ambulatory Visit: Payer: Medicaid Other

## 2014-11-23 ENCOUNTER — Encounter (HOSPITAL_COMMUNITY): Payer: Self-pay | Admitting: Cardiology

## 2014-11-23 ENCOUNTER — Emergency Department (HOSPITAL_COMMUNITY)
Admission: EM | Admit: 2014-11-23 | Discharge: 2014-11-23 | Disposition: A | Discharge status: Home / Self Care | Payer: Medicaid Other | Attending: Emergency Medicine | Admitting: Emergency Medicine

## 2014-11-23 DIAGNOSIS — J039 Acute tonsillitis, unspecified: Secondary | ICD-10-CM | POA: Insufficient documentation

## 2014-11-23 LAB — RAPID STREP SCREEN (MED CTR MEBANE ONLY): Streptococcus, Group A Screen (Direct): NEGATIVE

## 2014-11-23 MED ORDER — AMOXICILLIN 400 MG/5ML PO SUSR: 500.0000 mg | Freq: Two times a day (BID) | ORAL | Status: AC

## 2014-11-23 NOTE — Discharge Instructions (Signed)

## 2014-11-23 NOTE — ED Provider Notes (Signed)
CSN: [id number]     Arrival date & time 11/23/14  5852 History   First MD Initiated Contact with Patient 11/23/14 [phone]     Chief Complaint  Patient presents with  . Sore Throat     (Consider location/radiation/quality/duration/timing/severity/associated sxs/prior Treatment) HPI  This is a 19 year old female who presents with sore throat and "yellow spots on my tonsils." Patient reports recurrent tonsillitis. She states that she has had congestion and chills over the last several days. No documented fevers at home. She developed sore throat and noted spots on her tonsils. She denies any cough, shortness of breath, or chest pain. She denies any vomiting or diarrhea. She denies any difficulty swallowing or neck pain.  Past Medical History  Diagnosis Date  . Contraceptive management 12/26/2013  . Pregnant 07/14/2014   Past Surgical History  Procedure Laterality Date  . Hernia repair     Family History  Problem Relation Age of Onset  . Hypertension Father   . Alzheimer's disease Maternal Grandmother    History  Substance Use Topics  . Smoking status: Never Smoker   . Smokeless tobacco: Never Used  . Alcohol Use: No   OB History    Gravida Para Term Preterm AB TAB SAB Ectopic Multiple Living   1              Review of Systems  Constitutional: Positive for chills. Negative for fever.  HENT: Positive for sore throat. Negative for trouble swallowing and voice change.   Respiratory: Negative for cough, chest tightness and shortness of breath.   Cardiovascular: Negative for chest pain.  Gastrointestinal: Negative for nausea and vomiting.  Genitourinary: Negative for dysuria.  Neurological: Negative for headaches.  All other systems reviewed and are negative.     Allergies  Review of patient's allergies indicates no known allergies.  Home Medications   Prior to Admission medications   Medication Sig Start Date End Date Taking? Authorizing Provider  amoxicillin (AMOXIL)  400 MG/5ML suspension Take 6.3 mLs (500 mg total) by mouth 2 (two) times daily. 11/23/14 11/30/14  Merryl Hacker, MD  chlorhexidine (PERIDEX) 0.12 % solution Use as directed 15 mLs in the mouth or throat 2 (two) times daily. Patient not taking: Reported on 11/23/2014 07/21/14   Carmin Muskrat, MD   BP 110/76 mmHg  Pulse 102  Temp(Src) 98.6 F (37 C) (Oral)  Resp 18  Ht 5\' 4"  (1.626 m)  Wt 167 lb (75.751 kg)  BMI 28.65 kg/m2  SpO2 99%  LMP 11/05/2014  Breastfeeding? Unknown Physical Exam  Constitutional: She is oriented to person, place, and time. She appears well-developed and well-nourished. No distress.  HENT:  Head: Normocephalic and atraumatic.  Tonsillar exudate noted on the right tonsil, no asymmetric tonsillar enlargement, uvula midline  Eyes: Pupils are equal, round, and reactive to light.  Cardiovascular: Normal rate, regular rhythm and normal heart sounds.   No murmur heard. Pulmonary/Chest: Effort normal and breath sounds normal. No respiratory distress. She has no wheezes.  Abdominal: Soft. Bowel sounds are normal. There is no tenderness. There is no rebound.  Lymphadenopathy:    She has cervical adenopathy.  Neurological: She is alert and oriented to person, place, and time.  Skin: Skin is warm and dry.  Psychiatric: She has a normal mood and affect.  Nursing note and vitals reviewed.   ED Course  Procedures (including critical care time) Labs Review Labs Reviewed  RAPID STREP SCREEN  CULTURE, GROUP A STREP    Imaging Review  No results found.   EKG Interpretation None      MDM   Final diagnoses:  Tonsillitis    Patient presents with sore throat and tonsillar exudate. Subjective fevers and adenopathy.  Nontoxic on exam. No evidence of deep space infection. Will send a strep screen. Will elect to treat given she is Centor criteria 3/4 (no documented fevers).  Patient would like oral medication and can only tolerate liquid. Patient will be given 500  mg amoxicillin twice a day. Strep screen negative. Culture pending.  After history, exam, and medical workup I feel the patient has been appropriately medically screened and is safe for discharge home. Pertinent diagnoses were discussed with the patient. Patient was given return precautions.     Merryl Hacker, MD 11/23/14 1016

## 2014-11-23 NOTE — ED Notes (Signed)
Noticing yellow spots on her tonsils.  States she has not felt good for a couple of days.

## 2014-11-25 LAB — CULTURE, GROUP A STREP: STREP A CULTURE: NEGATIVE

## 2014-12-10 ENCOUNTER — Other Ambulatory Visit (HOSPITAL_COMMUNITY): Payer: Self-pay | Admitting: Otolaryngology

## 2014-12-22 ENCOUNTER — Encounter (HOSPITAL_COMMUNITY): Payer: Self-pay

## 2014-12-22 ENCOUNTER — Encounter (HOSPITAL_COMMUNITY)
Admission: RE | Admit: 2014-12-22 | Discharge: 2014-12-22 | Disposition: A | Discharge status: Home / Self Care | Payer: Self-pay | Source: Ambulatory Visit | Attending: Otolaryngology | Admitting: Otolaryngology

## 2014-12-22 DIAGNOSIS — Z01812 Encounter for preprocedural laboratory examination: Secondary | ICD-10-CM | POA: Insufficient documentation

## 2014-12-22 DIAGNOSIS — Z539 Procedure and treatment not carried out, unspecified reason: Secondary | ICD-10-CM | POA: Insufficient documentation

## 2014-12-22 HISTORY — DX: Complete or unspecified spontaneous abortion without complication: O03.9

## 2014-12-22 HISTORY — DX: Bronchitis, not specified as acute or chronic: J40

## 2014-12-22 HISTORY — DX: Acute recurrent tonsillitis, unspecified: J03.91

## 2014-12-22 LAB — CBC
HCT: 36.2 % (ref 36.0–46.0)
Hemoglobin: 11.9 g/dL — ABNORMAL LOW (ref 12.0–15.0)
MCH: 27 pg (ref 26.0–34.0)
MCHC: 32.9 g/dL (ref 30.0–36.0)
MCV: 82.1 fL (ref 78.0–100.0)
PLATELETS: 214 10*3/uL (ref 150–400)
RBC: 4.41 MIL/uL (ref 3.87–5.11)
RDW: 13.9 % (ref 11.5–15.5)
WBC: 7 10*3/uL (ref 4.0–10.5)

## 2014-12-22 LAB — HCG, SERUM, QUALITATIVE: Preg, Serum: NEGATIVE

## 2014-12-22 NOTE — Pre-Procedure Instructions (Signed)
Oria K Malesky  12/22/2014   Your procedure is scheduled on:  Friday, Dec 25, 2014  Report to [facility] Admitting at 9:00 AM.  Call this number if you have problems the morning of surgery: [phone]   Remember:   Do not eat food or drink liquids after midnight Thursday, Dec 24, 2014   Take these medicines the morning of surgery with A SIP OF WATER: None  Stop taking Aspirin, vitamins and herbal medications. Do not take any NSAIDs ie: Ibuprofen, Advil, Naproxen or any medication containing Aspirin; stop now.   Do not wear jewelry, make-up or nail polish.  Do not wear lotions, powders, or perfumes. You may not  wear deodorant.  Do not shave 48 hours prior to surgery.   Do not bring valuables to the hospital.  [facility] is not responsible for any belongings or valuables.               Contacts, dentures or bridgework may not be worn into surgery.  Leave suitcase in the car. After surgery it may be brought to your room.  For patients admitted to the hospital, discharge time is determined by your treatment team.               Patients discharged the day of surgery will not be allowed to drive home.  Name and phone number of your driver:   Special Instructions:  Special Instructions:Special Instructions: [facility] - Preparing for Surgery  Before surgery, you can play an important role.  Because skin is not sterile, your skin needs to be as free of germs as possible.  You can reduce the number of germs on you skin by washing with CHG (chlorahexidine gluconate) soap before surgery.  CHG is an antiseptic cleaner which kills germs and bonds with the skin to continue killing germs even after washing.  Please DO NOT use if you have an allergy to CHG or antibacterial soaps.  If your skin becomes reddened/irritated stop using the CHG and inform your nurse when you arrive at Short Stay.  Do not shave (including legs and underarms) for at least 48 hours prior to the first  CHG shower.  You may shave your face.  Please follow these instructions carefully:   1.  Shower with CHG Soap the night before surgery and the morning of Surgery.  2.  If you choose to wash your hair, wash your hair first as usual with your normal shampoo.  3.  After you shampoo, rinse your hair and body thoroughly to remove the Shampoo.  4.  Use CHG as you would any other liquid soap.  You can apply chg directly  to the skin and wash gently with scrungie or a clean washcloth.  5.  Apply the CHG Soap to your body ONLY FROM THE NECK DOWN.  Do not use on open wounds or open sores.  Avoid contact with your eyes, ears, mouth and genitals (private parts).  Wash genitals (private parts) with your normal soap.  6.  Wash thoroughly, paying special attention to the area where your surgery will be performed.  7.  Thoroughly rinse your body with warm water from the neck down.  8.  DO NOT shower/wash with your normal soap after using and rinsing off the CHG Soap.  9.  Pat yourself dry with a clean towel.            10.  Wear clean pajamas.  11.  Place clean sheets on your bed the night of your first shower and do not sleep with pets.  Day of Surgery  Do not apply any lotions/deodorants the morning of surgery.  Please wear clean clothes to the hospital/surgery center.   Please read over the following fact sheets that you were given: Pain Booklet, Coughing and Deep Breathing and Surgical Site Infection Prevention

## 2014-12-22 NOTE — Progress Notes (Signed)
Pt denies SOB, chest pain, and being under the care of a cardiologist. Pt denies having an EKG and chest x ray within the last year. Pt denies having a stress test, echo and cardiac cath.

## 2014-12-25 ENCOUNTER — Encounter (HOSPITAL_COMMUNITY): Admission: RE | Payer: Self-pay | Source: Ambulatory Visit

## 2014-12-25 ENCOUNTER — Encounter (HOSPITAL_COMMUNITY): Payer: Self-pay | Admitting: Certified Registered Nurse Anesthetist

## 2014-12-25 ENCOUNTER — Ambulatory Visit (HOSPITAL_COMMUNITY): Admission: RE | Admit: 2014-12-25 | Payer: Self-pay | Source: Ambulatory Visit | Admitting: Otolaryngology

## 2014-12-25 SURGERY — TONSILLECTOMY
Anesthesia: General | Laterality: Bilateral

## 2014-12-25 MED ORDER — PROPOFOL 10 MG/ML IV BOLUS
INTRAVENOUS | Status: AC
  Filled 2014-12-25: qty 20

## 2014-12-25 MED ORDER — ROCURONIUM BROMIDE 50 MG/5ML IV SOLN
INTRAVENOUS | Status: AC
  Filled 2014-12-25: qty 1

## 2014-12-25 MED ORDER — NEOSTIGMINE METHYLSULFATE 10 MG/10ML IV SOLN
INTRAVENOUS | Status: AC
  Filled 2014-12-25: qty 1

## 2014-12-25 MED ORDER — FENTANYL CITRATE (PF) 250 MCG/5ML IJ SOLN
INTRAMUSCULAR | Status: AC
  Filled 2014-12-25: qty 5

## 2014-12-25 MED ORDER — MIDAZOLAM HCL 2 MG/2ML IJ SOLN
INTRAMUSCULAR | Status: AC
  Filled 2014-12-25: qty 2

## 2014-12-25 MED ORDER — GLYCOPYRROLATE 0.2 MG/ML IJ SOLN
INTRAMUSCULAR | Status: AC
  Filled 2014-12-25: qty 3

## 2015-03-03 ENCOUNTER — Emergency Department (HOSPITAL_COMMUNITY)
Admission: EM | Admit: 2015-03-03 | Discharge: 2015-03-03 | Disposition: A | Discharge status: Home / Self Care | Payer: Medicaid Other | Attending: Emergency Medicine | Admitting: Emergency Medicine

## 2015-03-03 ENCOUNTER — Encounter (HOSPITAL_COMMUNITY): Payer: Self-pay | Admitting: Emergency Medicine

## 2015-03-03 DIAGNOSIS — Y998 Other external cause status: Secondary | ICD-10-CM | POA: Insufficient documentation

## 2015-03-03 DIAGNOSIS — S0083XA Contusion of other part of head, initial encounter: Secondary | ICD-10-CM

## 2015-03-03 DIAGNOSIS — Y9367 Activity, basketball: Secondary | ICD-10-CM | POA: Insufficient documentation

## 2015-03-03 DIAGNOSIS — W2105XA Struck by basketball, initial encounter: Secondary | ICD-10-CM | POA: Insufficient documentation

## 2015-03-03 DIAGNOSIS — Y92310 Basketball court as the place of occurrence of the external cause: Secondary | ICD-10-CM | POA: Insufficient documentation

## 2015-03-03 DIAGNOSIS — Z8709 Personal history of other diseases of the respiratory system: Secondary | ICD-10-CM | POA: Insufficient documentation

## 2015-03-03 DIAGNOSIS — S01511A Laceration without foreign body of lip, initial encounter: Secondary | ICD-10-CM | POA: Insufficient documentation

## 2015-03-03 NOTE — Discharge Instructions (Signed)
Facial or Scalp Contusion A facial or scalp contusion is a deep bruise on the face or head. Injuries to the face and head generally cause a lot of swelling, especially around the eyes. Contusions are the result of an injury that caused bleeding under the skin. The contusion may turn blue, purple, or yellow. Minor injuries will give you a painless contusion, but more severe contusions may stay painful and swollen for a few weeks.  CAUSES  A facial or scalp contusion is caused by a blunt injury or trauma to the face or head area.  SIGNS AND SYMPTOMS   Swelling of the injured area.   Discoloration of the injured area.   Tenderness, soreness, or pain in the injured area.  DIAGNOSIS  The diagnosis can be made by taking a medical history and doing a physical exam. An X-ray exam, CT scan, or MRI may be needed to determine if there are any associated injuries, such as broken bones (fractures). TREATMENT  Often, the best treatment for a facial or scalp contusion is applying cold compresses to the injured area. Over-the-counter medicines may also be recommended for pain control.  HOME CARE INSTRUCTIONS   Only take over-the-counter or prescription medicines as directed by your health care provider.   Apply ice to the injured area.   Put ice in a plastic bag.   Place a towel between your skin and the bag.   Leave the ice on for 20 minutes, 2-3 times a day.  SEEK MEDICAL CARE IF:  You have bite problems.   You have pain with chewing.   You are concerned about facial defects. SEEK IMMEDIATE MEDICAL CARE IF:  You have severe pain or a headache that is not relieved by medicine.   You have unusual sleepiness, confusion, or personality changes.   You throw up (vomit).   You have a persistent nosebleed.   You have double vision or blurred vision.   You have fluid drainage from your nose or ear.   You have difficulty walking or using your arms or legs.  MAKE SURE YOU:    Understand these instructions.  Will watch your condition.  Will get help right away if you are not doing well or get worse. Document Released: 09/07/2004 Document Revised: 05/21/2013 Document Reviewed: 03/13/2013 [employer] Patient Information 2015 [employer]. This information is not intended to replace advice given to you by your health care provider. Make sure you discuss any questions you have with your health care provider.  

## 2015-03-03 NOTE — ED Provider Notes (Signed)
CSN: [id number]     Arrival date & time 03/03/15  1832 History   First MD Initiated Contact with Patient 03/03/15 1924     Chief Complaint  Patient presents with  . Facial Injury     (Consider location/radiation/quality/duration/timing/severity/associated sxs/prior Treatment) Patient is a 19 y.o. female presenting with facial injury. The history is provided by the patient.  Facial Injury Associated symptoms: no ear pain    patient states she was hit in the face a couple times playing basketball. Swelling to her left cheek. No loss conscious. No other injury. No vision changes. No numbness weakness. States she also has a small cut to her lower lip.  Past Medical History  Diagnosis Date  . Contraceptive management 12/26/2013  . Pregnant 07/14/2014  . Miscarriage   . Recurrent tonsillitis   . Bronchitis     as a child only   Past Surgical History  Procedure Laterality Date  . Hernia repair     Family History  Problem Relation Age of Onset  . Hypertension Father   . Alzheimer's disease Maternal Grandmother    History  Substance Use Topics  . Smoking status: Never Smoker   . Smokeless tobacco: Never Used  . Alcohol Use: No   OB History    Gravida Para Term Preterm AB TAB SAB Ectopic Multiple Living   1              Review of Systems  Constitutional: Negative for fever.  HENT: Positive for facial swelling. Negative for ear discharge, ear pain and sore throat.   Eyes: Negative for pain.  Respiratory: Negative for cough.   Cardiovascular: Negative for chest pain.  Skin: Positive for wound.      Allergies  Review of patient's allergies indicates no known allergies.  Home Medications   Prior to Admission medications   Medication Sig Start Date End Date Taking? Authorizing Provider  ibuprofen (ADVIL,MOTRIN) 200 MG tablet Take 600 mg by mouth every 6 (six) hours as needed for mild pain or moderate pain.   Yes Historical Provider, MD   BP 111/68 mmHg  Pulse 68   Temp(Src) 98.5 F (36.9 C) (Oral)  Resp 18  Ht 5\' 4"  (1.626 m)  Wt 158 lb (71.668 kg)  BMI 27.11 kg/m2  SpO2 99%  LMP 02/22/2015 Physical Exam  Constitutional: She is oriented to person, place, and time. She appears well-developed.  HENT:  Head: Normocephalic.  Swelling to left superior cheek area. No underlying bony tenderness. Small sip relatively superficial laceration to the mucosal surface of lower lip. Bilateral TMs normal.  Eyes: EOM are normal. Pupils are equal, round, and reactive to light.  Neck: Neck supple.  Neurological: She is alert and oriented to person, place, and time.  Skin: Skin is warm.    ED Course  Procedures (including critical care time) Labs Review Labs Reviewed - No data to display  Imaging Review No results found.   EKG Interpretation None      MDM   Final diagnoses:  Facial contusion, initial encounter    Patient with confusion of face. No underlying bony tenderness. Doubt facial fracture. Small lip laceration does not appear to need suturing. Will discharge home.    Davonna Belling, MD 03/03/15 1944

## 2015-03-03 NOTE — ED Notes (Signed)
Pt states that she was hit in the face with a basketball earlier today.  Left cheek area very swollen.

## 2015-03-24 ENCOUNTER — Emergency Department (HOSPITAL_COMMUNITY)
Admission: EM | Admit: 2015-03-24 | Discharge: 2015-03-24 | Disposition: A | Discharge status: Home / Self Care | Payer: PRIVATE HEALTH INSURANCE | Attending: Emergency Medicine | Admitting: Emergency Medicine

## 2015-03-24 ENCOUNTER — Encounter (HOSPITAL_COMMUNITY): Payer: Self-pay | Admitting: *Deleted

## 2015-03-24 DIAGNOSIS — J039 Acute tonsillitis, unspecified: Secondary | ICD-10-CM | POA: Insufficient documentation

## 2015-03-24 LAB — RAPID STREP SCREEN (MED CTR MEBANE ONLY): STREPTOCOCCUS, GROUP A SCREEN (DIRECT): POSITIVE — AB

## 2015-03-24 MED ORDER — IBUPROFEN 100 MG/5ML PO SUSP
600.0000 mg | Freq: Once | ORAL | Status: AC
  Administered 2015-03-24: 600 mg via ORAL
  Filled 2015-03-24: qty 30

## 2015-03-24 MED ORDER — PENICILLIN V POTASSIUM 500 MG PO TABS: 500.0000 mg | ORAL_TABLET | Freq: Four times a day (QID) | ORAL | Status: AC

## 2015-03-24 MED ORDER — PENICILLIN V POTASSIUM 250 MG PO TABS
500.0000 mg | ORAL_TABLET | Freq: Once | ORAL | Status: AC
  Administered 2015-03-24: 500 mg via ORAL
  Filled 2015-03-24: qty 2

## 2015-03-24 MED ORDER — ACETAMINOPHEN 160 MG/5ML PO SOLN
650.0000 mg | Freq: Once | ORAL | Status: AC
  Administered 2015-03-24: 650 mg via ORAL
  Filled 2015-03-24: qty 20.3

## 2015-03-24 NOTE — ED Provider Notes (Signed)
CSN: [id number]     Arrival date & time 03/24/15  0520 History   First MD Initiated Contact with Patient 03/24/15 [phone]    Chief Complaint  Patient presents with  . Sore Throat     (Consider location/radiation/quality/duration/timing/severity/associated sxs/prior Treatment) HPI  Patient states yesterday she had a subjective fever and sore throat. She denies coughing, rhinorrhea, nausea, vomiting, diarrhea. She states this morning she started spitting up yellow mucus. She states she has pain when she swallows but she is able to swallow. She is not having any difficulty breathing. She has not been around anybody else is ill. She states she gets tonsillitis 3 or 4 times a year, normally during the cold weather. She states her boyfriend's house was cold the last time she visited him. She also states she was supposed to have a tonsillectomy done a few months ago but it needs to be rescheduled.  PCP none ENT Dr Simeon Craft  Past Medical History  Diagnosis Date  . Contraceptive management 12/26/2013  . Pregnant 07/14/2014  . Miscarriage   . Recurrent tonsillitis   . Bronchitis     as a child only   Past Surgical History  Procedure Laterality Date  . Hernia repair     Family History  Problem Relation Age of Onset  . Hypertension Father   . Alzheimer's disease Maternal Grandmother    Social History  Substance Use Topics  . Smoking status: Never Smoker   . Smokeless tobacco: Never Used  . Alcohol Use: No   employed  OB History    Gravida Para Term Preterm AB TAB SAB Ectopic Multiple Living   1              Review of Systems  All other systems reviewed and are negative.     Allergies  Review of patient's allergies indicates no known allergies.  Home Medications   Prior to Admission medications   Medication Sig Start Date End Date Taking? Authorizing Provider  ibuprofen (ADVIL,MOTRIN) 200 MG tablet Take 600 mg by mouth every 6 (six) hours as needed for mild pain or moderate  pain.    Historical Provider, MD  penicillin v potassium (VEETID) 500 MG tablet Take 1 tablet (500 mg total) by mouth 4 (four) times daily. 03/24/15 03/31/15  Rolland Porter, MD   BP 103/70 mmHg  Pulse 107  Temp(Src) 98.5 F (36.9 C) (Oral)  Resp 18  Ht 5\' 4"  (1.626 m)  Wt 166 lb (75.297 kg)  BMI 28.48 kg/m2  SpO2 100%  LMP 03/17/2015  Vital signs normal except for tachycardia  Physical Exam  Constitutional: She is oriented to person, place, and time. She appears well-developed and well-nourished.  Non-toxic appearance. She does not appear ill. No distress.  HENT:  Head: Normocephalic and atraumatic.  Right Ear: External ear normal.  Left Ear: External ear normal.  Nose: Nose normal. No mucosal edema or rhinorrhea.  Mouth/Throat: Oropharynx is clear and moist and mucous membranes are normal. No dental abscesses or uvula swelling.  Patient's tonsils are enlarged bilaterally and diffusely red without exudate. There is no swelling of the soft palate, uvula is normal sized and midline. Her voice is normal. She is not drooling or spitting at this time.  Eyes: Conjunctivae and EOM are normal. Pupils are equal, round, and reactive to light.  Neck: Normal range of motion and full passive range of motion without pain. Neck supple.  Patient has no tenderness to palpation when I palpate her neck or underneath  the tonsillar pillars.  Cardiovascular: Normal rate, regular rhythm and normal heart sounds.  Exam reveals no gallop and no friction rub.   No murmur heard. Pulmonary/Chest: Effort normal and breath sounds normal. No respiratory distress. She has no wheezes. She has no rhonchi. She has no rales. She exhibits no tenderness and no crepitus.  Abdominal: Soft. Normal appearance and bowel sounds are normal. She exhibits no distension. There is no tenderness. There is no rebound and no guarding.  Musculoskeletal: Normal range of motion. She exhibits no edema or tenderness.  Moves all extremities well.    Neurological: She is alert and oriented to person, place, and time. She has normal strength. No cranial nerve deficit.  Skin: Skin is warm, dry and intact. No rash noted. No erythema. No pallor.  Psychiatric: She has a normal mood and affect. Her speech is normal and behavior is normal. Her mood appears not anxious.  Nursing note and vitals reviewed.   ED Course  Procedures (including critical care time)  Medications  penicillin v potassium (VEETID) tablet 500 mg (not administered)  ibuprofen (ADVIL,MOTRIN) 100 MG/5ML suspension 600 mg (600 mg Oral Given 03/24/15 0637)  acetaminophen (TYLENOL) solution 650 mg (650 mg Oral Given 03/24/15 9678)   Patient was given liquid acetaminophen and Motrin for her. We discussed what do if her strep screen was positive and she states she wants pills, she does not want IM Bicillin.   Labs Review Results for orders placed or performed during the hospital encounter of 03/24/15  Rapid strep screen  Result Value Ref Range   Streptococcus, Group A Screen (Direct) POSITIVE (A) NEGATIVE   Laboratory interpretation all normal except positive strep     Imaging Review No results found.   EKG Interpretation None      MDM   Final diagnoses:  Tonsillitis    New Prescriptions   PENICILLIN V POTASSIUM (VEETID) 500 MG TABLET    Take 1 tablet (500 mg total) by mouth 4 (four) times daily.    Plan discharge  Rolland Porter, MD, Barbette Or, MD 03/24/15 [phone]

## 2015-03-24 NOTE — Discharge Instructions (Signed)
Drink plenty of fluids. Take ibuprofen 600 mg plus acetaminophen 1000 mg 4 times a day for pain and fever. Take the anti-biotics until gone. Follow up with Dr. Simeon Craft if you are not improving in the next 48 hours.   Tonsillitis Tonsillitis is an infection of the throat that causes the tonsils to become red, tender, and swollen. Tonsils are collections of lymphoid tissue at the back of the throat. Each tonsil has crevices (crypts). Tonsils help fight nose and throat infections and keep infection from spreading to other parts of the body for the first 18 months of life.  CAUSES Sudden (acute) tonsillitis is usually caused by infection with streptococcal bacteria. Long-lasting (chronic) tonsillitis occurs when the crypts of the tonsils become filled with pieces of food and bacteria, which makes it easy for the tonsils to become repeatedly infected. SYMPTOMS  Symptoms of tonsillitis include:  A sore throat, with possible difficulty swallowing.  White patches on the tonsils.  Fever.  Tiredness.  New episodes of snoring during sleep, when you did not snore before.  Small, foul-smelling, yellowish-white pieces of material (tonsilloliths) that you occasionally cough up or spit out. The tonsilloliths can also cause you to have bad breath. DIAGNOSIS Tonsillitis can be diagnosed through a physical exam. Diagnosis can be confirmed with the results of lab tests, including a throat culture. TREATMENT  The goals of tonsillitis treatment include the reduction of the severity and duration of symptoms and prevention of associated conditions. Symptoms of tonsillitis can be improved with the use of steroids to reduce the swelling. Tonsillitis caused by bacteria can be treated with antibiotic medicines. Usually, treatment with antibiotic medicines is started before the cause of the tonsillitis is known. However, if it is determined that the cause is not bacterial, antibiotic medicines will not treat the  tonsillitis. If attacks of tonsillitis are severe and frequent, your health care provider may recommend surgery to remove the tonsils (tonsillectomy). HOME CARE INSTRUCTIONS   Rest as much as possible and get plenty of sleep.  Drink plenty of fluids. While the throat is very sore, eat soft foods or liquids, such as sherbet, soups, or instant breakfast drinks.  Eat frozen ice pops.  Gargle with a warm or cold liquid to help soothe the throat. Mix 1/4 teaspoon of salt and 1/4 teaspoon of baking soda in 8 oz of water. SEEK MEDICAL CARE IF:   Large, tender lumps develop in your neck.  A rash develops.  A green, yellow-brown, or bloody substance is coughed up.  You are unable to swallow liquids or food for 24 hours.  You notice that only one of the tonsils is swollen. SEEK IMMEDIATE MEDICAL CARE IF:   You develop any new symptoms such as vomiting, severe headache, stiff neck, chest pain, or trouble breathing or swallowing.  You have severe throat pain along with drooling or voice changes.  You have severe pain, unrelieved with recommended medications.  You are unable to fully open the mouth.  You develop redness, swelling, or severe pain anywhere in the neck.  You have a fever. MAKE SURE YOU:   Understand these instructions.  Will watch your condition.  Will get help right away if you are not doing well or get worse. Document Released: 05/10/2005 Document Revised: 12/15/2013 Document Reviewed: 01/17/2013 [facility] Patient Information 2015 [place], [place]. This information is not intended to replace advice given to you by your health care provider. Make sure you discuss any questions you have with your health care provider.

## 2015-03-24 NOTE — ED Notes (Signed)
Pt c/o fever, sore throat, spitting up mucous that started a few days ago,

## 2015-03-24 NOTE — ED Notes (Signed)
Dr Knapp at bedside,  

## 2015-07-16 ENCOUNTER — Other Ambulatory Visit: Payer: Self-pay | Admitting: Obstetrics and Gynecology

## 2015-07-16 DIAGNOSIS — O3680X0 Pregnancy with inconclusive fetal viability, not applicable or unspecified: Secondary | ICD-10-CM

## 2015-07-19 ENCOUNTER — Ambulatory Visit (INDEPENDENT_AMBULATORY_CARE_PROVIDER_SITE_OTHER): Payer: Medicaid Other

## 2015-07-19 DIAGNOSIS — Z3A01 Less than 8 weeks gestation of pregnancy: Secondary | ICD-10-CM

## 2015-07-19 DIAGNOSIS — O3680X0 Pregnancy with inconclusive fetal viability, not applicable or unspecified: Secondary | ICD-10-CM | POA: Diagnosis not present

## 2015-07-19 NOTE — Progress Notes (Signed)
[place] 6+4wks single IUP w/ys, pos FHT 122 bpm,normal ov's bilat,crl 8.28mm

## 2015-07-20 ENCOUNTER — Telehealth: Payer: Self-pay | Admitting: Women's Health

## 2015-07-20 NOTE — Telephone Encounter (Signed)
Letter completed and left at front desk for pick up. 

## 2015-07-27 ENCOUNTER — Encounter: Payer: Self-pay | Admitting: Adult Health

## 2015-07-27 ENCOUNTER — Ambulatory Visit (INDEPENDENT_AMBULATORY_CARE_PROVIDER_SITE_OTHER): Payer: Medicaid Other | Admitting: Adult Health

## 2015-07-27 ENCOUNTER — Other Ambulatory Visit (HOSPITAL_COMMUNITY)
Admission: RE | Admit: 2015-07-27 | Discharge: 2015-07-27 | Disposition: A | Discharge status: Home / Self Care | Payer: PRIVATE HEALTH INSURANCE | Source: Ambulatory Visit | Attending: Adult Health | Admitting: Adult Health

## 2015-07-27 VITALS — BP 124/80 | HR 92 | Wt 175.5 lb

## 2015-07-27 DIAGNOSIS — Z3491 Encounter for supervision of normal pregnancy, unspecified, first trimester: Secondary | ICD-10-CM

## 2015-07-27 DIAGNOSIS — Z3481 Encounter for supervision of other normal pregnancy, first trimester: Secondary | ICD-10-CM

## 2015-07-27 DIAGNOSIS — Z3682 Encounter for antenatal screening for nuchal translucency: Secondary | ICD-10-CM

## 2015-07-27 DIAGNOSIS — Z113 Encounter for screening for infections with a predominantly sexual mode of transmission: Secondary | ICD-10-CM | POA: Insufficient documentation

## 2015-07-27 DIAGNOSIS — Z3490 Encounter for supervision of normal pregnancy, unspecified, unspecified trimester: Secondary | ICD-10-CM | POA: Insufficient documentation

## 2015-07-27 DIAGNOSIS — Z3A08 8 weeks gestation of pregnancy: Secondary | ICD-10-CM

## 2015-07-27 DIAGNOSIS — Z369 Encounter for antenatal screening, unspecified: Secondary | ICD-10-CM

## 2015-07-27 DIAGNOSIS — Z331 Pregnant state, incidental: Secondary | ICD-10-CM

## 2015-07-27 DIAGNOSIS — Z01411 Encounter for gynecological examination (general) (routine) with abnormal findings: Secondary | ICD-10-CM | POA: Insufficient documentation

## 2015-07-27 DIAGNOSIS — Z0283 Encounter for blood-alcohol and blood-drug test: Secondary | ICD-10-CM

## 2015-07-27 DIAGNOSIS — Z1389 Encounter for screening for other disorder: Secondary | ICD-10-CM

## 2015-07-27 DIAGNOSIS — Z8759 Personal history of other complications of pregnancy, childbirth and the puerperium: Secondary | ICD-10-CM

## 2015-07-27 LAB — POCT URINALYSIS DIPSTICK
Blood, UA: NEGATIVE
Glucose, UA: NEGATIVE
Ketones, UA: NEGATIVE
Leukocytes, UA: NEGATIVE
Nitrite, UA: NEGATIVE
PROTEIN UA: NEGATIVE

## 2015-07-27 NOTE — Progress Notes (Signed)
Subjective:  Joan Andrews is a 19 y.o. G25P0010 African American female at [redacted]w[redacted]d by [place] being seen today for her first obstetrical visit.  Her obstetrical history is significant for history of miscarriage last year.  Pregnancy history fully reviewed.  Patient reports no complaints. Denies vb, cramping, uti s/s, abnormal/malodorous vag d/c, or vulvovaginal itching/irritation.  BP 124/80 mmHg  Pulse 92  Wt 175 lb 8 oz (79.606 kg)  LMP 03/17/2015  HISTORY: OB History  Gravida Para Term Preterm AB SAB TAB Ectopic Multiple Living  2    1 1         # Outcome Date GA Lbr Len/2nd Weight Sex Delivery Anes PTL Lv  2 Current           1 SAB              Past Medical History  Diagnosis Date  . Contraceptive management 12/26/2013  . Pregnant 07/14/2014  . Miscarriage   . Recurrent tonsillitis   . Bronchitis     as a child only  . Supervision of normal pregnancy in first trimester 07/27/2015     [place] Initiated Care at   7+5 weeks FOB  Stephanie Acre 23 yo bm Dating By  LMP and [place] Pap  07/27/15 GC/CT Initial:                36+wks: Genetic Screen NT/IT:  CF screen  Anatomic [place]  Flu vaccine  Tdap Recommended ~ 28wks Glucose Screen  2 hr GBS  Feed Preference  Contraception  Circumcision  Childbirth Classes  Pediatrician     Past Surgical History  Procedure Laterality Date  . Hernia repair     Family History  Problem Relation Age of Onset  . Hypertension Father   . Alzheimer's disease Maternal Grandmother   . Hypertension Mother     Exam   System:     General: Well developed & nourished, no acute distress   Skin: Warm & dry, normal coloration and turgor, no rashes   Neurologic: Alert & oriented, normal mood   Cardiovascular: Regular rate & rhythm   Respiratory: Effort & rate normal, LCTAB, acyanotic   Abdomen: Soft, non tender   Extremities: normal strength, tone   Pelvic Exam:    Perineum: Normal perineum   Vulva: Normal, no lesions   Vagina:  Normal mucosa,  normal discharge   Cervix: Normal, bulbous, appears closed   Uterus: Normal size/shape/contour for GA   Thin prep pap smear with GC/CHL performed. FHR: 168 via [place]   Assessment:   Pregnancy: G2P0010 Patient Active Problem List   Diagnosis Date Noted  . Supervision of normal pregnancy in first trimester 07/27/2015  . Pregnant 07/14/2014  . Contraceptive management 12/26/2013    [redacted]w[redacted]d G2P0010 New OB visit     Plan:  Initial labs drawn Continue prenatal vitamins Problem list reviewed and updated Reviewed n/v relief measures and warning s/s to report Reviewed recommended weight gain based on pre-gravid BMI Encouraged well-balanced diet Genetic Screening discussed Integrated Screen: requested Cystic fibrosis screening discussed requested Ultrasound discussed; fetal survey: requested Follow up in 4 weeks for IT/NT and see Maudie Mercury Take 2 flintstones daily   Estill Dooms, NP 07/27/2015 3:01 PM

## 2015-07-27 NOTE — Patient Instructions (Signed)
First Trimester of Pregnancy The first trimester of pregnancy is from week 1 until the end of week 12 (months 1 through 3). A week after a sperm fertilizes an egg, the egg will implant on the wall of the uterus. This embryo will begin to develop into a baby. Genes from you and your partner are forming the baby. The female genes determine whether the baby is a boy or a girl. At 6-8 weeks, the eyes and face are formed, and the heartbeat can be seen on ultrasound. At the end of 12 weeks, all the baby's organs are formed.  Now that you are pregnant, you will want to do everything you can to have a healthy baby. Two of the most important things are to get good prenatal care and to follow your health care provider's instructions. Prenatal care is all the medical care you receive before the baby's birth. This care will help prevent, find, and treat any problems during the pregnancy and childbirth. BODY CHANGES Your body goes through many changes during pregnancy. The changes vary from woman to woman.   You may gain or lose a couple of pounds at first.  You may feel sick to your stomach (nauseous) and throw up (vomit). If the vomiting is uncontrollable, call your health care provider.  You may tire easily.  You may develop headaches that can be relieved by medicines approved by your health care provider.  You may urinate more often. Painful urination may mean you have a bladder infection.  You may develop heartburn as a result of your pregnancy.  You may develop constipation because certain hormones are causing the muscles that push waste through your intestines to slow down.  You may develop hemorrhoids or swollen, bulging veins (varicose veins).  Your breasts may begin to grow larger and become tender. Your nipples may stick out more, and the tissue that surrounds them (areola) may become darker.  Your gums may bleed and may be sensitive to brushing and flossing.  Dark spots or blotches (chloasma,  mask of pregnancy) may develop on your face. This will likely fade after the baby is born.  Your menstrual periods will stop.  You may have a loss of appetite.  You may develop cravings for certain kinds of food.  You may have changes in your emotions from day to day, such as being excited to be pregnant or being concerned that something may go wrong with the pregnancy and baby.  You may have more vivid and strange dreams.  You may have changes in your hair. These can include thickening of your hair, rapid growth, and changes in texture. Some women also have hair loss during or after pregnancy, or hair that feels dry or thin. Your hair will most likely return to normal after your baby is born. WHAT TO EXPECT AT YOUR PRENATAL VISITS During a routine prenatal visit:  You will be weighed to make sure you and the baby are growing normally.  Your blood pressure will be taken.  Your abdomen will be measured to track your baby's growth.  The fetal heartbeat will be listened to starting around week 10 or 12 of your pregnancy.  Test results from any previous visits will be discussed. Your health care provider may ask you:  How you are feeling.  If you are feeling the baby move.  If you have had any abnormal symptoms, such as leaking fluid, bleeding, severe headaches, or abdominal cramping.  If you are using any tobacco products,   including cigarettes, chewing tobacco, and electronic cigarettes.  If you have any questions. Other tests that may be performed during your first trimester include:  Blood tests to find your blood type and to check for the presence of any previous infections. They will also be used to check for low iron levels (anemia) and Rh antibodies. Later in the pregnancy, blood tests for diabetes will be done along with other tests if problems develop.  Urine tests to check for infections, diabetes, or protein in the urine.  An ultrasound to confirm the proper growth  and development of the baby.  An amniocentesis to check for possible genetic problems.  Fetal screens for spina bifida and Down syndrome.  You may need other tests to make sure you and the baby are doing well.  HIV (human immunodeficiency virus) testing. Routine prenatal testing includes screening for HIV, unless you choose not to have this test. HOME CARE INSTRUCTIONS  Medicines  Follow your health care provider's instructions regarding medicine use. Specific medicines may be either safe or unsafe to take during pregnancy.  Take your prenatal vitamins as directed.  If you develop constipation, try taking a stool softener if your health care provider approves. Diet  Eat regular, well-balanced meals. Choose a variety of foods, such as meat or vegetable-based protein, fish, milk and low-fat dairy products, vegetables, fruits, and whole grain breads and cereals. Your health care provider will help you determine the amount of weight gain that is right for you.  Avoid raw meat and uncooked cheese. These carry germs that can cause birth defects in the baby.  Eating four or five small meals rather than three large meals a day may help relieve nausea and vomiting. If you start to feel nauseous, eating a few soda crackers can be helpful. Drinking liquids between meals instead of during meals also seems to help nausea and vomiting.  If you develop constipation, eat more high-fiber foods, such as fresh vegetables or fruit and whole grains. Drink enough fluids to keep your urine clear or pale yellow. Activity and Exercise  Exercise only as directed by your health care provider. Exercising will help you:  Control your weight.  Stay in shape.  Be prepared for labor and delivery.  Experiencing pain or cramping in the lower abdomen or low back is a good sign that you should stop exercising. Check with your health care provider before continuing normal exercises.  Try to avoid standing for long  periods of time. Move your legs often if you must stand in one place for a long time.  Avoid heavy lifting.  Wear low-heeled shoes, and practice good posture.  You may continue to have sex unless your health care provider directs you otherwise. Relief of Pain or Discomfort  Wear a good support bra for breast tenderness.   Take warm sitz baths to soothe any pain or discomfort caused by hemorrhoids. Use hemorrhoid cream if your health care provider approves.   Rest with your legs elevated if you have leg cramps or low back pain.  If you develop varicose veins in your legs, wear support hose. Elevate your feet for 15 minutes, 3-4 times a day. Limit salt in your diet. Prenatal Care  Schedule your prenatal visits by the twelfth week of pregnancy. They are usually scheduled monthly at first, then more often in the last 2 months before delivery.  Write down your questions. Take them to your prenatal visits.  Keep all your prenatal visits as directed by your   health care provider. Safety  Wear your seat belt at all times when driving.  Make a list of emergency phone numbers, including numbers for family, friends, the hospital, and police and fire departments. General Tips  Ask your health care provider for a referral to a local prenatal education class. Begin classes no later than at the beginning of month 6 of your pregnancy.  Ask for help if you have counseling or nutritional needs during pregnancy. Your health care provider can offer advice or refer you to specialists for help with various needs.  Do not use hot tubs, steam rooms, or saunas.  Do not douche or use tampons or scented sanitary pads.  Do not cross your legs for long periods of time.  Avoid cat litter boxes and soil used by cats. These carry germs that can cause birth defects in the baby and possibly loss of the fetus by miscarriage or stillbirth.  Avoid all smoking, herbs, alcohol, and medicines not prescribed by  your health care provider. Chemicals in these affect the formation and growth of the baby.  Do not use any tobacco products, including cigarettes, chewing tobacco, and electronic cigarettes. If you need help quitting, ask your health care provider. You may receive counseling support and other resources to help you quit.  Schedule a dentist appointment. At home, brush your teeth with a soft toothbrush and be gentle when you floss. SEEK MEDICAL CARE IF:   You have dizziness.  You have mild pelvic cramps, pelvic pressure, or nagging pain in the abdominal area.  You have persistent nausea, vomiting, or diarrhea.  You have a bad smelling vaginal discharge.  You have pain with urination.  You notice increased swelling in your face, hands, legs, or ankles. SEEK IMMEDIATE MEDICAL CARE IF:   You have a fever.  You are leaking fluid from your vagina.  You have spotting or bleeding from your vagina.  You have severe abdominal cramping or pain.  You have rapid weight gain or loss.  You vomit blood or material that looks like coffee grounds.  You are exposed to [place] measles and have never had them.  You are exposed to fifth disease or chickenpox.  You develop a severe headache.  You have shortness of breath.  You have any kind of trauma, such as from a fall or a car accident.   This information is not intended to replace advice given to you by your health care provider. Make sure you discuss any questions you have with your health care provider.   Document Released: 07/25/2001 Document Revised: 08/21/2014 Document Reviewed: 06/10/2013 Elsevier Interactive Patient Education [employer]. Return in 4 weeks for IT/NT and see Maudie Mercury

## 2015-07-28 LAB — CYTOLOGY - PAP

## 2015-07-29 LAB — URINE CULTURE: Organism ID, Bacteria: NO GROWTH

## 2015-08-03 LAB — PMP SCREEN PROFILE (10S), URINE
AMPHETAMINE SCRN UR: NEGATIVE ng/mL
BARBITURATE SCRN UR: NEGATIVE ng/mL
Benzodiazepine Screen, Urine: NEGATIVE ng/mL
CREATININE(CRT), U: 270 mg/dL (ref 20.0–300.0)
Cannabinoids Ur Ql Scn: NEGATIVE ng/mL
Cocaine(Metab.)Screen, Urine: NEGATIVE ng/mL
METHADONE SCREEN, URINE: NEGATIVE ng/mL
Opiate Scrn, Ur: NEGATIVE ng/mL
Oxycodone+Oxymorphone Ur Ql Scn: NEGATIVE ng/mL
PCP SCRN UR: NEGATIVE ng/mL
Ph of Urine: 5.8 (ref 4.5–8.9)
Propoxyphene, Screen: NEGATIVE ng/mL

## 2015-08-03 LAB — CYSTIC FIBROSIS MUTATION 97: Interpretation: NOT DETECTED

## 2015-08-03 LAB — URINALYSIS, ROUTINE W REFLEX MICROSCOPIC
BILIRUBIN UA: NEGATIVE
Glucose, UA: NEGATIVE
KETONES UA: NEGATIVE
Leukocytes, UA: NEGATIVE
Nitrite, UA: NEGATIVE
RBC UA: NEGATIVE
Urobilinogen, Ur: 1 mg/dL (ref 0.2–1.0)
pH, UA: 6 (ref 5.0–7.5)

## 2015-08-03 LAB — RUBELLA SCREEN: Rubella Antibodies, IGG: 2.04 index (ref >0.99)

## 2015-08-03 LAB — CBC
HEMOGLOBIN: 11.1 g/dL (ref 11.1–15.9)
Hematocrit: 34.4 % (ref 34.0–46.6)
MCH: 26.1 pg — ABNORMAL LOW (ref 26.6–33.0)
MCHC: 32.3 g/dL (ref 31.5–35.7)
MCV: 81 fL (ref 79–97)
Platelets: 276 10*3/uL (ref 150–379)
RBC: 4.25 x10E6/uL (ref 3.77–5.28)
RDW: 14.4 % (ref 12.3–15.4)
WBC: 7.8 10*3/uL (ref 3.4–10.8)

## 2015-08-03 LAB — RPR: RPR: NONREACTIVE

## 2015-08-03 LAB — ANTIBODY SCREEN: Antibody Screen: NEGATIVE

## 2015-08-03 LAB — ABO/RH: Rh Factor: POSITIVE

## 2015-08-03 LAB — HIV ANTIBODY (ROUTINE TESTING W REFLEX): HIV Screen 4th Generation wRfx: NONREACTIVE

## 2015-08-03 LAB — SICKLE CELL SCREEN: SICKLE CELL SCREEN: NEGATIVE

## 2015-08-03 LAB — VARICELLA ZOSTER ANTIBODY, IGG

## 2015-08-03 LAB — HEPATITIS B SURFACE ANTIGEN: HEP B S AG: NEGATIVE

## 2015-08-11 ENCOUNTER — Telehealth: Payer: Self-pay | Admitting: Women's Health

## 2015-08-11 NOTE — Telephone Encounter (Signed)
Pt states she is about [redacted] weeks pregnant and yesterday had some nausea and felt a little dizzy.  Advised pt to eat small frequent meals, push water and can try lemon in water or sucking on lemon candy and see if that helps.  If nothing helping call us back, pt verbalized understanding.

## 2015-08-15 NOTE — L&D Delivery Note (Signed)
Delivery Note Pt pushed approx 15 mins and at 9:03 PM a viable female was delivered via Vaginal, Spontaneous Delivery (Presentation: Left Occiput Anterior).  APGAR: 9, 9; weight: pending .  Infant dried and placed on pt's abd; cord clamped and cut by FOB. Hospital cord blood sample collected.  Placenta status: Intact, Spontaneous.  Cord: 3 vessels with the following complications: None.    Anesthesia: Local, 1% lidocaine Episiotomy:  none Lacerations:  2nd degree vaginal Suture Repair: 3.0 monocryl Est. Blood Loss (mL):    Mom to postpartum.  Baby to Couplet care / Skin to Skin.  Serita Grammes CNM 03/01/2016, 9:30 PM

## 2015-08-24 ENCOUNTER — Ambulatory Visit (INDEPENDENT_AMBULATORY_CARE_PROVIDER_SITE_OTHER): Payer: Self-pay | Admitting: Women's Health

## 2015-08-24 ENCOUNTER — Ambulatory Visit (INDEPENDENT_AMBULATORY_CARE_PROVIDER_SITE_OTHER): Payer: Medicaid Other

## 2015-08-24 ENCOUNTER — Encounter: Payer: Self-pay | Admitting: Women's Health

## 2015-08-24 VITALS — BP 112/58 | HR 64 | Wt 178.0 lb

## 2015-08-24 DIAGNOSIS — Z3682 Encounter for antenatal screening for nuchal translucency: Secondary | ICD-10-CM

## 2015-08-24 DIAGNOSIS — Z36 Encounter for antenatal screening of mother: Secondary | ICD-10-CM | POA: Diagnosis not present

## 2015-08-24 DIAGNOSIS — Z3481 Encounter for supervision of other normal pregnancy, first trimester: Secondary | ICD-10-CM

## 2015-08-24 DIAGNOSIS — Z331 Pregnant state, incidental: Secondary | ICD-10-CM

## 2015-08-24 DIAGNOSIS — Z1389 Encounter for screening for other disorder: Secondary | ICD-10-CM

## 2015-08-24 DIAGNOSIS — Z3491 Encounter for supervision of normal pregnancy, unspecified, first trimester: Secondary | ICD-10-CM

## 2015-08-24 DIAGNOSIS — Z3A12 12 weeks gestation of pregnancy: Secondary | ICD-10-CM

## 2015-08-24 LAB — POCT URINALYSIS DIPSTICK
Blood, UA: NEGATIVE
Glucose, UA: NEGATIVE
KETONES UA: NEGATIVE
Leukocytes, UA: NEGATIVE
Nitrite, UA: NEGATIVE
Protein, UA: NEGATIVE

## 2015-08-24 NOTE — Progress Notes (Signed)
[place] 11+5wks,measurements c/w dates,crl 55.92mm,NB present,NT 1.37mm,normal ov bilat,fhr 167 bpm,ant pl gr 0

## 2015-08-24 NOTE — Progress Notes (Signed)
Low-risk OB appointment [id number] [redacted]w[redacted]d Estimated Date of Delivery: 03/09/16 BP 112/58 mmHg  Pulse 64  Wt 178 lb (80.74 kg)  LMP 03/17/2015  BP, weight, and urine reviewed.  Refer to obstetrical flow sheet for FH & FHR.  No fm yet. Denies cramping, lof, vb, or uti s/s. No complaints. Reviewed today's normal nt u/s, warning s/s to report. Plan:  Continue routine obstetrical care  F/U in 4wks for OB appointment and 2nd IT 1st IT/NT today

## 2015-08-24 NOTE — Patient Instructions (Signed)
First Trimester of Pregnancy The first trimester of pregnancy is from week 1 until the end of week 12 (months 1 through 3). A week after a sperm fertilizes an egg, the egg will implant on the wall of the uterus. This embryo will begin to develop into a baby. Genes from you and your partner are forming the baby. The female genes determine whether the baby is a boy or a girl. At 6-8 weeks, the eyes and face are formed, and the heartbeat can be seen on ultrasound. At the end of 12 weeks, all the baby's organs are formed.  Now that you are pregnant, you will want to do everything you can to have a healthy baby. Two of the most important things are to get good prenatal care and to follow your health care provider's instructions. Prenatal care is all the medical care you receive before the baby's birth. This care will help prevent, find, and treat any problems during the pregnancy and childbirth. BODY CHANGES Your body goes through many changes during pregnancy. The changes vary from woman to woman.   You may gain or lose a couple of pounds at first.  You may feel sick to your stomach (nauseous) and throw up (vomit). If the vomiting is uncontrollable, call your health care provider.  You may tire easily.  You may develop headaches that can be relieved by medicines approved by your health care provider.  You may urinate more often. Painful urination may mean you have a bladder infection.  You may develop heartburn as a result of your pregnancy.  You may develop constipation because certain hormones are causing the muscles that push waste through your intestines to slow down.  You may develop hemorrhoids or swollen, bulging veins (varicose veins).  Your breasts may begin to grow larger and become tender. Your nipples may stick out more, and the tissue that surrounds them (areola) may become darker.  Your gums may bleed and may be sensitive to brushing and flossing.  Dark spots or blotches (chloasma,  mask of pregnancy) may develop on your face. This will likely fade after the baby is born.  Your menstrual periods will stop.  You may have a loss of appetite.  You may develop cravings for certain kinds of food.  You may have changes in your emotions from day to day, such as being excited to be pregnant or being concerned that something may go wrong with the pregnancy and baby.  You may have more vivid and strange dreams.  You may have changes in your hair. These can include thickening of your hair, rapid growth, and changes in texture. Some women also have hair loss during or after pregnancy, or hair that feels dry or thin. Your hair will most likely return to normal after your baby is born. WHAT TO EXPECT AT YOUR PRENATAL VISITS During a routine prenatal visit:  You will be weighed to make sure you and the baby are growing normally.  Your blood pressure will be taken.  Your abdomen will be measured to track your baby's growth.  The fetal heartbeat will be listened to starting around week 10 or 12 of your pregnancy.  Test results from any previous visits will be discussed. Your health care provider may ask you:  How you are feeling.  If you are feeling the baby move.  If you have had any abnormal symptoms, such as leaking fluid, bleeding, severe headaches, or abdominal cramping.  If you are using any tobacco products,   including cigarettes, chewing tobacco, and electronic cigarettes.  If you have any questions. Other tests that may be performed during your first trimester include:  Blood tests to find your blood type and to check for the presence of any previous infections. They will also be used to check for low iron levels (anemia) and Rh antibodies. Later in the pregnancy, blood tests for diabetes will be done along with other tests if problems develop.  Urine tests to check for infections, diabetes, or protein in the urine.  An ultrasound to confirm the proper growth  and development of the baby.  An amniocentesis to check for possible genetic problems.  Fetal screens for spina bifida and Down syndrome.  You may need other tests to make sure you and the baby are doing well.  HIV (human immunodeficiency virus) testing. Routine prenatal testing includes screening for HIV, unless you choose not to have this test. HOME CARE INSTRUCTIONS  Medicines  Follow your health care provider's instructions regarding medicine use. Specific medicines may be either safe or unsafe to take during pregnancy.  Take your prenatal vitamins as directed.  If you develop constipation, try taking a stool softener if your health care provider approves. Diet  Eat regular, well-balanced meals. Choose a variety of foods, such as meat or vegetable-based protein, fish, milk and low-fat dairy products, vegetables, fruits, and whole grain breads and cereals. Your health care provider will help you determine the amount of weight gain that is right for you.  Avoid raw meat and uncooked cheese. These carry germs that can cause birth defects in the baby.  Eating four or five small meals rather than three large meals a day may help relieve nausea and vomiting. If you start to feel nauseous, eating a few soda crackers can be helpful. Drinking liquids between meals instead of during meals also seems to help nausea and vomiting.  If you develop constipation, eat more high-fiber foods, such as fresh vegetables or fruit and whole grains. Drink enough fluids to keep your urine clear or pale yellow. Activity and Exercise  Exercise only as directed by your health care provider. Exercising will help you:  Control your weight.  Stay in shape.  Be prepared for labor and delivery.  Experiencing pain or cramping in the lower abdomen or low back is a good sign that you should stop exercising. Check with your health care provider before continuing normal exercises.  Try to avoid standing for long  periods of time. Move your legs often if you must stand in one place for a long time.  Avoid heavy lifting.  Wear low-heeled shoes, and practice good posture.  You may continue to have sex unless your health care provider directs you otherwise. Relief of Pain or Discomfort  Wear a good support bra for breast tenderness.   Take warm sitz baths to soothe any pain or discomfort caused by hemorrhoids. Use hemorrhoid cream if your health care provider approves.   Rest with your legs elevated if you have leg cramps or low back pain.  If you develop varicose veins in your legs, wear support hose. Elevate your feet for 15 minutes, 3-4 times a day. Limit salt in your diet. Prenatal Care  Schedule your prenatal visits by the twelfth week of pregnancy. They are usually scheduled monthly at first, then more often in the last 2 months before delivery.  Write down your questions. Take them to your prenatal visits.  Keep all your prenatal visits as directed by your   health care provider. Safety  Wear your seat belt at all times when driving.  Make a list of emergency phone numbers, including numbers for family, friends, the hospital, and police and fire departments. General Tips  Ask your health care provider for a referral to a local prenatal education class. Begin classes no later than at the beginning of month 6 of your pregnancy.  Ask for help if you have counseling or nutritional needs during pregnancy. Your health care provider can offer advice or refer you to specialists for help with various needs.  Do not use hot tubs, steam rooms, or saunas.  Do not douche or use tampons or scented sanitary pads.  Do not cross your legs for long periods of time.  Avoid cat litter boxes and soil used by cats. These carry germs that can cause birth defects in the baby and possibly loss of the fetus by miscarriage or stillbirth.  Avoid all smoking, herbs, alcohol, and medicines not prescribed by  your health care provider. Chemicals in these affect the formation and growth of the baby.  Do not use any tobacco products, including cigarettes, chewing tobacco, and electronic cigarettes. If you need help quitting, ask your health care provider. You may receive counseling support and other resources to help you quit.  Schedule a dentist appointment. At home, brush your teeth with a soft toothbrush and be gentle when you floss. SEEK MEDICAL CARE IF:   You have dizziness.  You have mild pelvic cramps, pelvic pressure, or nagging pain in the abdominal area.  You have persistent nausea, vomiting, or diarrhea.  You have a bad smelling vaginal discharge.  You have pain with urination.  You notice increased swelling in your face, hands, legs, or ankles. SEEK IMMEDIATE MEDICAL CARE IF:   You have a fever.  You are leaking fluid from your vagina.  You have spotting or bleeding from your vagina.  You have severe abdominal cramping or pain.  You have rapid weight gain or loss.  You vomit blood or material that looks like coffee grounds.  You are exposed to [place] measles and have never had them.  You are exposed to fifth disease or chickenpox.  You develop a severe headache.  You have shortness of breath.  You have any kind of trauma, such as from a fall or a car accident.   This information is not intended to replace advice given to you by your health care provider. Make sure you discuss any questions you have with your health care provider.   Document Released: 07/25/2001 Document Revised: 08/21/2014 Document Reviewed: 06/10/2013 Elsevier Interactive Patient Education 2016 [employer]  

## 2015-08-26 LAB — MATERNAL SCREEN, INTEGRATED #1
CROWN RUMP LENGTH MAT SCREEN: 55.6 mm
Gest. Age on Collection Date: 12.1 weeks
Maternal Age at EDD: 21.6 years
Nuchal Translucency (NT): 1.4 mm
Number of Fetuses: 1
PAPP-A Value: 1079.3 ng/mL
Weight: 178 [lb_av]

## 2015-09-21 ENCOUNTER — Encounter: Payer: Self-pay | Admitting: Women's Health

## 2015-09-21 ENCOUNTER — Ambulatory Visit (INDEPENDENT_AMBULATORY_CARE_PROVIDER_SITE_OTHER): Payer: Self-pay | Admitting: Women's Health

## 2015-09-21 VITALS — BP 102/70 | HR 68 | Wt 183.0 lb

## 2015-09-21 DIAGNOSIS — Z1389 Encounter for screening for other disorder: Secondary | ICD-10-CM

## 2015-09-21 DIAGNOSIS — Z331 Pregnant state, incidental: Secondary | ICD-10-CM

## 2015-09-21 DIAGNOSIS — Z3682 Encounter for antenatal screening for nuchal translucency: Secondary | ICD-10-CM

## 2015-09-21 DIAGNOSIS — Z3492 Encounter for supervision of normal pregnancy, unspecified, second trimester: Secondary | ICD-10-CM

## 2015-09-21 DIAGNOSIS — Z363 Encounter for antenatal screening for malformations: Secondary | ICD-10-CM

## 2015-09-21 LAB — POCT URINALYSIS DIPSTICK
GLUCOSE UA: NEGATIVE
LEUKOCYTES UA: NEGATIVE
Nitrite, UA: NEGATIVE
Protein, UA: NEGATIVE
RBC UA: NEGATIVE

## 2015-09-21 NOTE — Patient Instructions (Signed)

## 2015-09-21 NOTE — Progress Notes (Signed)
Low-risk OB appointment [id number] [redacted]w[redacted]d Estimated Date of Delivery: 03/09/16 BP 102/70 mmHg  Pulse 68  Wt 183 lb (83.008 kg)  LMP 03/17/2015  BP, weight, and urine reviewed.  Refer to obstetrical flow sheet for FH & FHR.  Feeling some fm. Denies cramping, lof, vb, or uti s/s. No complaints. Reviewed warning s/s to report. Plan:  Continue routine obstetrical care  F/U in 4wks for OB appointment and anatomy u/s 2nd IT today

## 2015-09-23 LAB — MATERNAL SCREEN, INTEGRATED #2
ADSF: 0.81
AFP MOM: 1.46
Alpha-Fetoprotein: 43.9 ng/mL
CROWN RUMP LENGTH: 55.6 mm
DIA MOM: 0.89
DIA Value: 139.7 pg/mL
Estriol, Unconjugated: 0.68 ng/mL
Gest. Age on Collection Date: 12.1 weeks
Gestational Age: 16.1 weeks
Maternal Age at EDD: 21.6 years
NUCHAL TRANSLUCENCY MOM: 0.96
NUMBER OF FETUSES: 1
Nuchal Translucency (NT): 1.4 mm
PAPP-A MoM: 1.57
PAPP-A Value: 1079.3 ng/mL
Test Results:: NEGATIVE
Weight: 178 [lb_av]
Weight: 178 [lb_av]
hCG MoM: 2.22
hCG Value: 68.9 IU/mL

## 2015-10-19 ENCOUNTER — Encounter: Payer: Self-pay | Admitting: Women's Health

## 2015-10-19 ENCOUNTER — Ambulatory Visit (INDEPENDENT_AMBULATORY_CARE_PROVIDER_SITE_OTHER): Payer: Medicaid Other | Admitting: Women's Health

## 2015-10-19 ENCOUNTER — Ambulatory Visit (INDEPENDENT_AMBULATORY_CARE_PROVIDER_SITE_OTHER): Payer: Medicaid Other

## 2015-10-19 VITALS — BP 100/80 | HR 74 | Wt 188.0 lb

## 2015-10-19 DIAGNOSIS — Z36 Encounter for antenatal screening of mother: Secondary | ICD-10-CM | POA: Diagnosis not present

## 2015-10-19 DIAGNOSIS — Z363 Encounter for antenatal screening for malformations: Secondary | ICD-10-CM

## 2015-10-19 DIAGNOSIS — Z3492 Encounter for supervision of normal pregnancy, unspecified, second trimester: Secondary | ICD-10-CM

## 2015-10-19 DIAGNOSIS — Z3A20 20 weeks gestation of pregnancy: Secondary | ICD-10-CM

## 2015-10-19 DIAGNOSIS — Z1389 Encounter for screening for other disorder: Secondary | ICD-10-CM

## 2015-10-19 DIAGNOSIS — Z3482 Encounter for supervision of other normal pregnancy, second trimester: Secondary | ICD-10-CM

## 2015-10-19 DIAGNOSIS — Z331 Pregnant state, incidental: Secondary | ICD-10-CM

## 2015-10-19 LAB — POCT URINALYSIS DIPSTICK
Blood, UA: NEGATIVE
Glucose, UA: NEGATIVE
Ketones, UA: NEGATIVE
Leukocytes, UA: NEGATIVE
Nitrite, UA: NEGATIVE
PROTEIN UA: NEGATIVE

## 2015-10-19 NOTE — Progress Notes (Signed)
Low-risk OB appointment [id number] [redacted]w[redacted]d Estimated Date of Delivery: 03/09/16 BP 100/80 mmHg  Pulse 74  Wt 188 lb (85.276 kg)  LMP 03/17/2015  BP, weight, and urine reviewed.  Refer to obstetrical flow sheet for FH & FHR.  Reports good fm.  Denies regular uc's, lof, vb, or uti s/s. Cold x 2d, gave printed info on relief measures, reasons to seek care.  Reviewed today's normal anatomy u/s. Plan:  Continue routine obstetrical care  F/U in 4wks for OB appointment  Declined flu shot

## 2015-10-19 NOTE — Progress Notes (Signed)
[place] 19+5 wks,ant pl gr 0,bilat adnexa wnl,cephalic,svp of fluid 3.8 cm,cx 3.3 cm,fhr 149 bpm,efw 325 g ,anatomy complete, no obvious abnormalities seen

## 2015-10-19 NOTE — Patient Instructions (Addendum)
You have a viral infection that will resolve on its own over time.  Symptoms typically last 3-7 days but can stretch out to 2-3 weeks.  Unfortunately, antibiotics are not helpful for viral infections.  Humidifier and saline nasal spray for nasal congestion  Regular robitussin, cough drops for cough  Warm salt water gargles for sore throat  Mucinex with lots of water to help you cough up the mucous in your chest if needed  Drink plenty of fluids and stay hydrated!  Wash your hands frequently.  Call if you are not improving by 7-10 days.    Second Trimester of Pregnancy The second trimester is from week 13 through week 28, months 4 through 6. The second trimester is often a time when you feel your best. Your body has also adjusted to being pregnant, and you begin to feel better physically. Usually, morning sickness has lessened or quit completely, you may have more energy, and you may have an increase in appetite. The second trimester is also a time when the fetus is growing rapidly. At the end of the sixth month, the fetus is about 9 inches long and weighs about 1 pounds. You will likely begin to feel the baby move (quickening) between 18 and 20 weeks of the pregnancy. BODY CHANGES Your body goes through many changes during pregnancy. The changes vary from woman to woman.  8. Your weight will continue to increase. You will notice your lower abdomen bulging out. 9. You may begin to get stretch marks on your hips, abdomen, and breasts. 10. You may develop headaches that can be relieved by medicines approved by your health care provider. 11. You may urinate more often because the fetus is pressing on your bladder. 12. You may develop or continue to have heartburn as a result of your pregnancy. 13. You may develop constipation because certain hormones are causing the muscles that push waste through your intestines to slow down. 14. You may develop hemorrhoids or swollen, bulging veins (varicose  veins). 15. You may have back pain because of the weight gain and pregnancy hormones relaxing your joints between the bones in your pelvis and as a result of a shift in weight and the muscles that support your balance. 16. Your breasts will continue to grow and be tender. 17. Your gums may bleed and may be sensitive to brushing and flossing. 18. Dark spots or blotches (chloasma, mask of pregnancy) may develop on your face. This will likely fade after the baby is born. 19. A dark line from your belly button to the pubic area (linea nigra) may appear. This will likely fade after the baby is born. 20. You may have changes in your hair. These can include thickening of your hair, rapid growth, and changes in texture. Some women also have hair loss during or after pregnancy, or hair that feels dry or thin. Your hair will most likely return to normal after your baby is born. WHAT TO EXPECT AT YOUR PRENATAL VISITS During a routine prenatal visit:  You will be weighed to make sure you and the fetus are growing normally.  Your blood pressure will be taken.  Your abdomen will be measured to track your baby's growth.  The fetal heartbeat will be listened to.  Any test results from the previous visit will be discussed. Your health care provider may ask you:  How you are feeling.  If you are feeling the baby move.  If you have had any abnormal symptoms, such as  leaking fluid, bleeding, severe headaches, or abdominal cramping.  If you are using any tobacco products, including cigarettes, chewing tobacco, and electronic cigarettes.  If you have any questions. Other tests that may be performed during your second trimester include:  Blood tests that check for:  Low iron levels (anemia).  Gestational diabetes (between 24 and 28 weeks).  Rh antibodies.  Urine tests to check for infections, diabetes, or protein in the urine.  An ultrasound to confirm the proper growth and development of the  baby.  An amniocentesis to check for possible genetic problems.  Fetal screens for spina bifida and Down syndrome.  HIV (human immunodeficiency virus) testing. Routine prenatal testing includes screening for HIV, unless you choose not to have this test. HOME CARE INSTRUCTIONS   Avoid all smoking, herbs, alcohol, and unprescribed drugs. These chemicals affect the formation and growth of the baby.  Do not use any tobacco products, including cigarettes, chewing tobacco, and electronic cigarettes. If you need help quitting, ask your health care provider. You may receive counseling support and other resources to help you quit.  Follow your health care provider's instructions regarding medicine use. There are medicines that are either safe or unsafe to take during pregnancy.  Exercise only as directed by your health care provider. Experiencing uterine cramps is a good sign to stop exercising.  Continue to eat regular, healthy meals.  Wear a good support bra for breast tenderness.  Do not use hot tubs, steam rooms, or saunas.  Wear your seat belt at all times when driving.  Avoid raw meat, uncooked cheese, cat litter boxes, and soil used by cats. These carry germs that can cause birth defects in the baby.  Take your prenatal vitamins.  Take 1500-2000 mg of calcium daily starting at the 20th week of pregnancy until you deliver your baby.  Try taking a stool softener (if your health care provider approves) if you develop constipation. Eat more high-fiber foods, such as fresh vegetables or fruit and whole grains. Drink plenty of fluids to keep your urine clear or pale yellow.  Take warm sitz baths to soothe any pain or discomfort caused by hemorrhoids. Use hemorrhoid cream if your health care provider approves.  If you develop varicose veins, wear support hose. Elevate your feet for 15 minutes, 3-4 times a day. Limit salt in your diet.  Avoid heavy lifting, wear low heel shoes, and  practice good posture.  Rest with your legs elevated if you have leg cramps or low back pain.  Visit your dentist if you have not gone yet during your pregnancy. Use a soft toothbrush to brush your teeth and be gentle when you floss.  A sexual relationship may be continued unless your health care provider directs you otherwise.  Continue to go to all your prenatal visits as directed by your health care provider. SEEK MEDICAL CARE IF:   You have dizziness.  You have mild pelvic cramps, pelvic pressure, or nagging pain in the abdominal area.  You have persistent nausea, vomiting, or diarrhea.  You have a bad smelling vaginal discharge.  You have pain with urination. SEEK IMMEDIATE MEDICAL CARE IF:   You have a fever.  You are leaking fluid from your vagina.  You have spotting or bleeding from your vagina.  You have severe abdominal cramping or pain.  You have rapid weight gain or loss.  You have shortness of breath with chest pain.  You notice sudden or extreme swelling of your face, hands, ankles,  feet, or legs.  You have not felt your baby move in over an hour.  You have severe headaches that do not go away with medicine.  You have vision changes.   This information is not intended to replace advice given to you by your health care provider. Make sure you discuss any questions you have with your health care provider.   Document Released: 07/25/2001 Document Revised: 08/21/2014 Document Reviewed: 10/01/2012 Elsevier Interactive Patient Education [employer].

## 2015-11-16 ENCOUNTER — Ambulatory Visit (INDEPENDENT_AMBULATORY_CARE_PROVIDER_SITE_OTHER): Payer: Medicaid Other | Admitting: Advanced Practice Midwife

## 2015-11-16 VITALS — BP 118/62 | HR 98 | Wt 196.0 lb

## 2015-11-16 DIAGNOSIS — Z331 Pregnant state, incidental: Secondary | ICD-10-CM

## 2015-11-16 DIAGNOSIS — Z3492 Encounter for supervision of normal pregnancy, unspecified, second trimester: Secondary | ICD-10-CM

## 2015-11-16 DIAGNOSIS — Z1389 Encounter for screening for other disorder: Secondary | ICD-10-CM

## 2015-11-16 LAB — POCT URINALYSIS DIPSTICK
Blood, UA: NEGATIVE
GLUCOSE UA: NEGATIVE
LEUKOCYTES UA: NEGATIVE
Nitrite, UA: NEGATIVE

## 2015-11-16 NOTE — Patient Instructions (Signed)

## 2015-11-16 NOTE — Progress Notes (Signed)
G2P0010 [redacted]w[redacted]d Estimated Date of Delivery: 03/09/16  Blood pressure 118/62, pulse 98, weight 196 lb (88.905 kg), last menstrual period 03/17/2015, unknown if currently breastfeeding.   BP weight and urine results all reviewed and noted.  Please refer to the obstetrical flow sheet for the fundal height and fetal heart rate documentation:  Patient reports good fetal movement, denies any bleeding and no rupture of membranes symptoms or regular contractions. Patient is without complaints. All questions were answered.  Orders Placed This Encounter  Procedures  . POCT urinalysis dipstick    Plan:  Continued routine obstetrical care,   Return in about 4 weeks (around 12/14/2015) for PN2/LROB.

## 2015-11-22 ENCOUNTER — Telehealth: Payer: Self-pay | Admitting: *Deleted

## 2015-11-22 NOTE — Telephone Encounter (Signed)
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 Pt verbalized understanding.

## 2015-12-14 ENCOUNTER — Encounter: Payer: Self-pay | Admitting: Women's Health

## 2015-12-14 ENCOUNTER — Ambulatory Visit (INDEPENDENT_AMBULATORY_CARE_PROVIDER_SITE_OTHER): Payer: Medicaid Other | Admitting: Women's Health

## 2015-12-14 ENCOUNTER — Other Ambulatory Visit: Payer: Medicaid Other

## 2015-12-14 VITALS — BP 100/60 | HR 64 | Wt 195.0 lb

## 2015-12-14 DIAGNOSIS — Z131 Encounter for screening for diabetes mellitus: Secondary | ICD-10-CM

## 2015-12-14 DIAGNOSIS — Z369 Encounter for antenatal screening, unspecified: Secondary | ICD-10-CM

## 2015-12-14 DIAGNOSIS — Z1389 Encounter for screening for other disorder: Secondary | ICD-10-CM

## 2015-12-14 DIAGNOSIS — Z3492 Encounter for supervision of normal pregnancy, unspecified, second trimester: Secondary | ICD-10-CM

## 2015-12-14 DIAGNOSIS — Z331 Pregnant state, incidental: Secondary | ICD-10-CM

## 2015-12-14 LAB — POCT URINALYSIS DIPSTICK
Blood, UA: NEGATIVE
Glucose, UA: NEGATIVE
Ketones, UA: NEGATIVE
LEUKOCYTES UA: NEGATIVE
NITRITE UA: NEGATIVE
Protein, UA: NEGATIVE

## 2015-12-14 NOTE — Progress Notes (Signed)
Low-risk OB appointment G2P0010 [redacted]w[redacted]d Estimated Date of Delivery: 03/09/16 BP 100/60 mmHg  Pulse 64  Wt 195 lb (88.451 kg)  LMP 03/17/2015  BP, weight, and urine reviewed.  Refer to obstetrical flow sheet for FH & FHR.  Reports good fm.  Denies regular uc's, lof, vb, or uti s/s. No complaints. Reviewed ptl s/s, fkc. Recommended Tdap at HD/PCP per CDC guidelines.  Plan:  Continue routine obstetrical care  F/U in 4wks for OB appointment  PN2 today

## 2015-12-14 NOTE — Patient Instructions (Addendum)
Call the office [phone]) or go to [facility] if:  You begin to have strong, frequent contractions  Your water breaks.  Sometimes it is a big gush of fluid, sometimes it is just a trickle that keeps getting your panties wet or running down your legs  You have vaginal bleeding.  It is normal to have a small amount of spotting if your cervix was checked.   You don't feel your baby moving like normal.  If you don't, get you something to eat and drink and lay down and focus on feeling your baby move.  You should feel at least 10 movements in 2 hours.  If you don't, you should call the office or go to [facility].    Tdap Vaccine  It is recommended that you get the Tdap vaccine during the third trimester of EACH pregnancy to help protect your baby from getting pertussis (whooping cough)  27-36 weeks is the BEST time to do this so that you can pass the protection on to your baby. During pregnancy is better than after pregnancy, but if you are unable to get it during pregnancy it will be offered at the hospital.   You can get this vaccine at the health department or your family doctor  Everyone who will be around your baby should also be up-to-date on their vaccines. Adults (who are not pregnant) only need 1 dose of Tdap during adulthood.   [place] Pediatricians/Family Doctors:  [facility] [place] [phone]                 [place] [phone] (usually not accepting new patients unless you have family there already, you are always welcome to call and ask)            [facility] ([place]) [phone]   [facility] Pediatricians/Family Doctors:   [facility]: [phone]  [facility]: [phone]    Third Trimester of Pregnancy The third trimester is from week 29 through week 42, months 7 through 9. The third trimester is a time when the  fetus is growing rapidly. At the end of the ninth month, the fetus is about 20 inches in length and weighs 6-10 pounds.  BODY CHANGES Your body goes through many changes during pregnancy. The changes vary from woman to woman.  9. Your weight will continue to increase. You can expect to gain 25-35 pounds (11-16 kg) by the end of the pregnancy. 10. You may begin to get stretch marks on your hips, abdomen, and breasts. 11. You may urinate more often because the fetus is moving lower into your pelvis and pressing on your bladder. 12. You may develop or continue to have heartburn as a result of your pregnancy. 13. You may develop constipation because certain hormones are causing the muscles that push waste through your intestines to slow down. 14. You may develop hemorrhoids or swollen, bulging veins (varicose veins). 15. You may have pelvic pain because of the weight gain and pregnancy hormones relaxing your joints between the bones in your pelvis. Backaches may result from overexertion of the muscles supporting your posture. 16. You may have changes in your hair. These can include thickening of your hair, rapid growth, and changes in texture. Some women also have hair loss during or after pregnancy, or hair that feels dry or thin. Your hair will most likely return to normal after  your baby is born. 17. Your breasts will continue to grow and be tender. A yellow discharge may leak from your breasts called colostrum. 18. Your belly button may stick out. 53. You may feel short of breath because of your expanding uterus. 75. You may notice the fetus "dropping," or moving lower in your abdomen. 21. You may have a bloody mucus discharge. This usually occurs a few days to a week before labor begins. 6. Your cervix becomes thin and soft (effaced) near your due date. WHAT TO EXPECT AT YOUR PRENATAL EXAMS  You will have prenatal exams every 2 weeks until week 36. Then, you will have weekly prenatal exams.  During a routine prenatal visit: 3. You will be weighed to make sure you and the fetus are growing normally. 4. Your blood pressure is taken. 5. Your abdomen will be measured to track your baby's growth. 6. The fetal heartbeat will be listened to. 7. Any test results from the previous visit will be discussed. 8. You may have a cervical check near your due date to see if you have effaced. At around 36 weeks, your caregiver will check your cervix. At the same time, your caregiver will also perform a test on the secretions of the vaginal tissue. This test is to determine if a type of bacteria, Group B streptococcus, is present. Your caregiver will explain this further. Your caregiver may ask you: 2. What your birth plan is. 3. How you are feeling. 4. If you are feeling the baby move. 5. If you have had any abnormal symptoms, such as leaking fluid, bleeding, severe headaches, or abdominal cramping. 6. If you have any questions. Other tests or screenings that may be performed during your third trimester include: 2. Blood tests that check for low iron levels (anemia). 3. Fetal testing to check the health, activity level, and growth of the fetus. Testing is done if you have certain medical conditions or if there are problems during the pregnancy. FALSE LABOR You may feel small, irregular contractions that eventually go away. These are called Braxton Hicks contractions, or false labor. Contractions may last for hours, days, or even weeks before true labor sets in. If contractions come at regular intervals, intensify, or become painful, it is best to be seen by your caregiver.  SIGNS OF LABOR  3. Menstrual-like cramps. 4. Contractions that are 5 minutes apart or less. 5. Contractions that start on the top of the uterus and spread down to the lower abdomen and back. 6. A sense of increased pelvic pressure or back pain. 7. A watery or bloody mucus discharge that comes from the vagina. If you have any  of these signs before the 37th week of pregnancy, call your caregiver right away. You need to go to the hospital to get checked immediately. HOME CARE INSTRUCTIONS   Avoid all smoking, herbs, alcohol, and unprescribed drugs. These chemicals affect the formation and growth of the baby.  Follow your caregiver's instructions regarding medicine use. There are medicines that are either safe or unsafe to take during pregnancy.  Exercise only as directed by your caregiver. Experiencing uterine cramps is a good sign to stop exercising.  Continue to eat regular, healthy meals.  Wear a good support bra for breast tenderness.  Do not use hot tubs, steam rooms, or saunas.  Wear your seat belt at all times when driving.  Avoid raw meat, uncooked cheese, cat litter boxes, and soil used by cats. These carry germs that can cause birth  defects in the baby.  Take your prenatal vitamins.  Try taking a stool softener (if your caregiver approves) if you develop constipation. Eat more high-fiber foods, such as fresh vegetables or fruit and whole grains. Drink plenty of fluids to keep your urine clear or pale yellow.  Take warm sitz baths to soothe any pain or discomfort caused by hemorrhoids. Use hemorrhoid cream if your caregiver approves.  If you develop varicose veins, wear support hose. Elevate your feet for 15 minutes, 3-4 times a day. Limit salt in your diet.  Avoid heavy lifting, wear low heal shoes, and practice good posture.  Rest a lot with your legs elevated if you have leg cramps or low back pain.  Visit your dentist if you have not gone during your pregnancy. Use a soft toothbrush to brush your teeth and be gentle when you floss.  A sexual relationship may be continued unless your caregiver directs you otherwise.  Do not travel far distances unless it is absolutely necessary and only with the approval of your caregiver.  Take prenatal classes to understand, practice, and ask questions  about the labor and delivery.  Make a trial run to the hospital.  Pack your hospital bag.  Prepare the baby's nursery.  Continue to go to all your prenatal visits as directed by your caregiver. SEEK MEDICAL CARE IF:  You are unsure if you are in labor or if your water has broken.  You have dizziness.  You have mild pelvic cramps, pelvic pressure, or nagging pain in your abdominal area.  You have persistent nausea, vomiting, or diarrhea.  You have a bad smelling vaginal discharge.  You have pain with urination. SEEK IMMEDIATE MEDICAL CARE IF:   You have a fever.  You are leaking fluid from your vagina.  You have spotting or bleeding from your vagina.  You have severe abdominal cramping or pain.  You have rapid weight loss or gain.  You have shortness of breath with chest pain.  You notice sudden or extreme swelling of your face, hands, ankles, feet, or legs.  You have not felt your baby move in over an hour.  You have severe headaches that do not go away with medicine.  You have vision changes. Document Released: 07/25/2001 Document Revised: 08/05/2013 Document Reviewed: 10/01/2012 [facility] Patient Information 2015 [place], [place]. This information is not intended to replace advice given to you by your health care provider. Make sure you discuss any questions you have with your health care provider.

## 2015-12-15 LAB — CBC

## 2015-12-15 LAB — ANTIBODY SCREEN

## 2015-12-16 LAB — GLUCOSE TOLERANCE, 2 HOURS W/ 1HR
GLUCOSE, 1 HOUR: 118 mg/dL (ref 65–179)
Glucose, 2 hour: 111 mg/dL (ref 65–152)
Glucose, Fasting: 78 mg/dL (ref 65–91)

## 2015-12-16 LAB — HIV ANTIBODY (ROUTINE TESTING W REFLEX): HIV SCREEN 4TH GENERATION: NONREACTIVE

## 2015-12-16 LAB — RPR: RPR: NONREACTIVE

## 2016-01-11 ENCOUNTER — Encounter: Payer: Self-pay | Admitting: Advanced Practice Midwife

## 2016-01-11 ENCOUNTER — Ambulatory Visit (INDEPENDENT_AMBULATORY_CARE_PROVIDER_SITE_OTHER): Payer: Medicaid Other | Admitting: Advanced Practice Midwife

## 2016-01-11 VITALS — BP 120/80 | HR 84 | Wt 202.0 lb

## 2016-01-11 DIAGNOSIS — Z3493 Encounter for supervision of normal pregnancy, unspecified, third trimester: Secondary | ICD-10-CM

## 2016-01-11 DIAGNOSIS — Z1389 Encounter for screening for other disorder: Secondary | ICD-10-CM

## 2016-01-11 DIAGNOSIS — Z369 Encounter for antenatal screening, unspecified: Secondary | ICD-10-CM

## 2016-01-11 DIAGNOSIS — Z3483 Encounter for supervision of other normal pregnancy, third trimester: Secondary | ICD-10-CM

## 2016-01-11 DIAGNOSIS — Z3A32 32 weeks gestation of pregnancy: Secondary | ICD-10-CM

## 2016-01-11 DIAGNOSIS — Z331 Pregnant state, incidental: Secondary | ICD-10-CM

## 2016-01-11 LAB — POCT URINALYSIS DIPSTICK
Blood, UA: NEGATIVE
Glucose, UA: NEGATIVE
Ketones, UA: NEGATIVE
LEUKOCYTES UA: NEGATIVE
Nitrite, UA: NEGATIVE

## 2016-01-11 NOTE — Progress Notes (Signed)
G2P0010 [redacted]w[redacted]d Estimated Date of Delivery: 03/09/16  Blood pressure 120/80, pulse 84, weight 202 lb (91.627 kg), last menstrual period 03/17/2015, unknown if currently breastfeeding.   BP weight and urine results all reviewed and noted.  Please refer to the obstetrical flow sheet for the fundal height and fetal heart rate documentation:  Patient reports good fetal movement, denies any bleeding and no rupture of membranes symptoms or regular contractions. Patient is without complaints. Birth Control discussed.  All questions were answered.  Orders Placed This Encounter  Procedures  . CBC  . POCT urinalysis dipstick  . Antibody screen    Plan:  Continued routine obstetrical care,   Return in about 2 weeks (around 01/25/2016) for [place].

## 2016-01-11 NOTE — Progress Notes (Signed)
Pt denies any problems or concerns at this time.  

## 2016-01-12 LAB — CBC
Hematocrit: 30.9 % — ABNORMAL LOW (ref 34.0–46.6)
Hemoglobin: 10.4 g/dL — ABNORMAL LOW (ref 11.1–15.9)
MCH: 27.7 pg (ref 26.6–33.0)
MCHC: 33.7 g/dL (ref 31.5–35.7)
MCV: 82 fL (ref 79–97)
Platelets: 246 10*3/uL (ref 150–379)
RBC: 3.75 x10E6/uL — ABNORMAL LOW (ref 3.77–5.28)
RDW: 13.5 % (ref 12.3–15.4)
WBC: 8.6 10*3/uL (ref 3.4–10.8)

## 2016-01-12 LAB — ANTIBODY SCREEN: ANTIBODY SCREEN: NEGATIVE

## 2016-01-25 ENCOUNTER — Encounter: Payer: Self-pay | Admitting: Women's Health

## 2016-01-25 ENCOUNTER — Ambulatory Visit (INDEPENDENT_AMBULATORY_CARE_PROVIDER_SITE_OTHER): Payer: Medicaid Other | Admitting: Women's Health

## 2016-01-25 VITALS — BP 108/58 | HR 80 | Wt 203.0 lb

## 2016-01-25 DIAGNOSIS — Z331 Pregnant state, incidental: Secondary | ICD-10-CM

## 2016-01-25 DIAGNOSIS — Z1389 Encounter for screening for other disorder: Secondary | ICD-10-CM

## 2016-01-25 DIAGNOSIS — Z3493 Encounter for supervision of normal pregnancy, unspecified, third trimester: Secondary | ICD-10-CM

## 2016-01-25 DIAGNOSIS — Z3A34 34 weeks gestation of pregnancy: Secondary | ICD-10-CM

## 2016-01-25 DIAGNOSIS — Z3483 Encounter for supervision of other normal pregnancy, third trimester: Secondary | ICD-10-CM

## 2016-01-25 LAB — POCT URINALYSIS DIPSTICK
GLUCOSE UA: NEGATIVE
Ketones, UA: NEGATIVE
Leukocytes, UA: NEGATIVE
NITRITE UA: NEGATIVE
RBC UA: NEGATIVE

## 2016-01-25 NOTE — Patient Instructions (Signed)
Call the office ([phone]) or go to [facility] if:  You begin to have strong, frequent contractions  Your water breaks.  Sometimes it is a big gush of fluid, sometimes it is just a trickle that keeps getting your panties wet or running down your legs  You have vaginal bleeding.  It is normal to have a small amount of spotting if your cervix was checked.   You don't feel your baby moving like normal.  If you don't, get you something to eat and drink and lay down and focus on feeling your baby move.  You should feel at least 10 movements in 2 hours.  If you don't, you should call the office or go to [facility].    Tdap Vaccine  It is recommended that you get the Tdap vaccine during the third trimester of EACH pregnancy to help protect your baby from getting pertussis (whooping cough)  27-36 weeks is the BEST time to do this so that you can pass the protection on to your baby. During pregnancy is better than after pregnancy, but if you are unable to get it during pregnancy it will be offered at the hospital.   You can get this vaccine at the health department or your family doctor  Everyone who will be around your baby should also be up-to-date on their vaccines. Adults (who are not pregnant) only need 1 dose of Tdap during adulthood.      Preterm Labor Information Preterm labor is when labor starts at less than 37 weeks of pregnancy. The normal length of a pregnancy is 39 to 41 weeks. CAUSES Often, there is no identifiable underlying cause as to why a woman goes into preterm labor. One of the most common known causes of preterm labor is infection. Infections of the uterus, cervix, vagina, amniotic sac, bladder, kidney, or even the lungs (pneumonia) can cause labor to start. Other suspected causes of preterm labor include:  8. Urogenital infections, such as yeast infections and bacterial vaginosis.  9. Uterine abnormalities (uterine shape, uterine septum, fibroids, or bleeding  from the placenta).  10. A cervix that has been operated on (it may fail to stay closed).  11. Malformations in the fetus.  12. Multiple gestations (twins, triplets, and so on).  13. Breakage of the amniotic sac.  RISK FACTORS 2. Having a previous history of preterm labor.  3. Having premature rupture of membranes (PROM).  4. Having a placenta that covers the opening of the cervix (placenta previa).  5. Having a placenta that separates from the uterus (placental abruption).  6. Having a cervix that is too weak to hold the fetus in the uterus (incompetent cervix).  7. Having too much fluid in the amniotic sac (polyhydramnios).  8. Taking illegal drugs or smoking while pregnant.  9. Not gaining enough weight while pregnant.  10. Being younger than 18 and older than 22 years old.  11. Having a low socioeconomic status.  12. Being African American. SYMPTOMS Signs and symptoms of preterm labor include:   Menstrual-like cramps, abdominal pain, or back pain.  Uterine contractions that are regular, as frequent as six in an hour, regardless of their intensity (may be mild or painful).  Contractions that start on the top of the uterus and spread down to the lower abdomen and back.   A sense of increased pelvic pressure.   A watery or bloody mucus discharge that comes from the vagina.  TREATMENT Depending on the length of the pregnancy and   other circumstances, your health care provider may suggest bed rest. If necessary, there are medicines that can be given to stop contractions and to mature the fetal lungs. If labor happens before 34 weeks of pregnancy, a prolonged hospital stay may be recommended. Treatment depends on the condition of both you and the fetus.  WHAT SHOULD YOU DO IF YOU THINK YOU ARE IN PRETERM LABOR? Call your health care provider right away. You will need to go to the hospital to get checked immediately. HOW CAN YOU PREVENT PRETERM LABOR IN FUTURE  PREGNANCIES? You should:   Stop smoking if you smoke.  Maintain healthy weight gain and avoid chemicals and drugs that are not necessary.  Be watchful for any type of infection.  Inform your health care provider if you have a known history of preterm labor.   This information is not intended to replace advice given to you by your health care provider. Make sure you discuss any questions you have with your health care provider.   Document Released: 10/21/2003 Document Revised: 04/02/2013 Document Reviewed: 09/02/2012 Elsevier Interactive Patient Education 2016 [employer]  

## 2016-01-25 NOTE — Progress Notes (Signed)
Low-risk OB appointment G2P0010 [redacted]w[redacted]d Estimated Date of Delivery: 03/09/16 BP 108/58 mmHg  Pulse 80  Wt 203 lb (92.08 kg)  LMP 03/17/2015  BP, weight, and urine reviewed.  Refer to obstetrical flow sheet for FH & FHR.  Reports good fm.  Denies regular uc's, lof, vb, or uti s/s. No complaints. Reviewed ptl s/s, fkc. Recommended Tdap at HD/PCP per CDC guidelines.  Plan:  Continue routine obstetrical care  F/U in 2wks for OB appointment

## 2016-02-07 ENCOUNTER — Encounter: Payer: Self-pay | Admitting: Women's Health

## 2016-02-07 ENCOUNTER — Ambulatory Visit (INDEPENDENT_AMBULATORY_CARE_PROVIDER_SITE_OTHER): Payer: Medicaid Other | Admitting: Women's Health

## 2016-02-07 VITALS — BP 108/70 | HR 68 | Wt 208.0 lb

## 2016-02-07 DIAGNOSIS — Z1389 Encounter for screening for other disorder: Secondary | ICD-10-CM

## 2016-02-07 DIAGNOSIS — Z331 Pregnant state, incidental: Secondary | ICD-10-CM

## 2016-02-07 DIAGNOSIS — Z3493 Encounter for supervision of normal pregnancy, unspecified, third trimester: Secondary | ICD-10-CM

## 2016-02-07 LAB — POCT URINALYSIS DIPSTICK
GLUCOSE UA: NEGATIVE
KETONES UA: NEGATIVE
NITRITE UA: NEGATIVE
Protein, UA: NEGATIVE
RBC UA: NEGATIVE

## 2016-02-07 NOTE — Progress Notes (Signed)
Low-risk OB appointment G2P0010 [redacted]w[redacted]d Estimated Date of Delivery: 03/09/16 BP 108/70 mmHg  Pulse 68  Wt 208 lb (94.348 kg)  LMP 03/17/2015  BP, weight, and urine reviewed.  Refer to obstetrical flow sheet for FH & FHR.  Reports good fm.  Denies regular uc's, lof, vb, or uti s/s. No complaints. Reviewed ptl s/s, fkc. Plan:  Continue routine obstetrical care  F/U in 1wk for OB appointment and gbs

## 2016-02-07 NOTE — Patient Instructions (Signed)
Call the office ([phone]) or go to [facility] if:  You begin to have strong, frequent contractions  Your water breaks.  Sometimes it is a big gush of fluid, sometimes it is just a trickle that keeps getting your panties wet or running down your legs  You have vaginal bleeding.  It is normal to have a small amount of spotting if your cervix was checked.   You don't feel your baby moving like normal.  If you don't, get you something to eat and drink and lay down and focus on feeling your baby move.  You should feel at least 10 movements in 2 hours.  If you don't, you should call the office or go to [facility].    Preterm Labor Information Preterm labor is when labor starts at less than 37 weeks of pregnancy. The normal length of a pregnancy is 39 to 41 weeks. CAUSES Often, there is no identifiable underlying cause as to why a woman goes into preterm labor. One of the most common known causes of preterm labor is infection. Infections of the uterus, cervix, vagina, amniotic sac, bladder, kidney, or even the lungs (pneumonia) can cause labor to start. Other suspected causes of preterm labor include:   Urogenital infections, such as yeast infections and bacterial vaginosis.   Uterine abnormalities (uterine shape, uterine septum, fibroids, or bleeding from the placenta).   A cervix that has been operated on (it may fail to stay closed).   Malformations in the fetus.   Multiple gestations (twins, triplets, and so on).   Breakage of the amniotic sac.  RISK FACTORS  Having a previous history of preterm labor.   Having premature rupture of membranes (PROM).   Having a placenta that covers the opening of the cervix (placenta previa).   Having a placenta that separates from the uterus (placental abruption).   Having a cervix that is too weak to hold the fetus in the uterus (incompetent cervix).   Having too much fluid in the amniotic sac (polyhydramnios).   Taking  illegal drugs or smoking while pregnant.   Not gaining enough weight while pregnant.   Being younger than 18 and older than 22 years old.   Having a low socioeconomic status.   Being African American. SYMPTOMS Signs and symptoms of preterm labor include:   Menstrual-like cramps, abdominal pain, or back pain.  Uterine contractions that are regular, as frequent as six in an hour, regardless of their intensity (may be mild or painful).  Contractions that start on the top of the uterus and spread down to the lower abdomen and back.   A sense of increased pelvic pressure.   A watery or bloody mucus discharge that comes from the vagina.  TREATMENT Depending on the length of the pregnancy and other circumstances, your health care provider may suggest bed rest. If necessary, there are medicines that can be given to stop contractions and to mature the fetal lungs. If labor happens before 34 weeks of pregnancy, a prolonged hospital stay may be recommended. Treatment depends on the condition of both you and the fetus.  WHAT SHOULD YOU DO IF YOU THINK YOU ARE IN PRETERM LABOR? Call your health care provider right away. You will need to go to the hospital to get checked immediately. HOW CAN YOU PREVENT PRETERM LABOR IN FUTURE PREGNANCIES? You should:   Stop smoking if you smoke.  Maintain healthy weight gain and avoid chemicals and drugs that are not necessary.  Be watchful for   any type of infection.  Inform your health care provider if you have a known history of preterm labor.   This information is not intended to replace advice given to you by your health care provider. Make sure you discuss any questions you have with your health care provider.   Document Released: 10/21/2003 Document Revised: 04/02/2013 Document Reviewed: 09/02/2012 Elsevier Interactive Patient Education 2016 Elsevier Inc.  

## 2016-02-14 ENCOUNTER — Ambulatory Visit (INDEPENDENT_AMBULATORY_CARE_PROVIDER_SITE_OTHER): Payer: Medicaid Other | Admitting: Women's Health

## 2016-02-14 ENCOUNTER — Encounter: Payer: Self-pay | Admitting: Women's Health

## 2016-02-14 VITALS — BP 108/80 | HR 80 | Wt 204.0 lb

## 2016-02-14 DIAGNOSIS — Z3A37 37 weeks gestation of pregnancy: Secondary | ICD-10-CM

## 2016-02-14 DIAGNOSIS — Z1159 Encounter for screening for other viral diseases: Secondary | ICD-10-CM

## 2016-02-14 DIAGNOSIS — Z3685 Encounter for antenatal screening for Streptococcus B: Secondary | ICD-10-CM

## 2016-02-14 DIAGNOSIS — Z1389 Encounter for screening for other disorder: Secondary | ICD-10-CM

## 2016-02-14 DIAGNOSIS — Z331 Pregnant state, incidental: Secondary | ICD-10-CM

## 2016-02-14 DIAGNOSIS — Z3483 Encounter for supervision of other normal pregnancy, third trimester: Secondary | ICD-10-CM

## 2016-02-14 DIAGNOSIS — Z118 Encounter for screening for other infectious and parasitic diseases: Secondary | ICD-10-CM

## 2016-02-14 DIAGNOSIS — Z3493 Encounter for supervision of normal pregnancy, unspecified, third trimester: Secondary | ICD-10-CM

## 2016-02-14 LAB — POCT URINALYSIS DIPSTICK
GLUCOSE UA: NEGATIVE
KETONES UA: NEGATIVE
NITRITE UA: NEGATIVE
RBC UA: NEGATIVE

## 2016-02-14 NOTE — Progress Notes (Signed)
Low-risk OB appointment G2P0010 [redacted]w[redacted]d Estimated Date of Delivery: 03/09/16 BP 108/80 mmHg  Pulse 80  Wt 204 lb (92.534 kg)  LMP 03/17/2015  BP, weight, and urine reviewed.  Refer to obstetrical flow sheet for FH & FHR.  Reports good fm.  Denies regular uc's, lof, vb, or uti s/s. No complaints. GBS, gc/ct collected SVe per request: 1/70/-2, vtx Reviewed labor s/s, fkc. Plan:  Continue routine obstetrical care  F/U in 1wk for OB appointment

## 2016-02-14 NOTE — Patient Instructions (Signed)
Call the office ([phone]) or go to [facility] if:  You begin to have strong, frequent contractions  Your water breaks.  Sometimes it is a big gush of fluid, sometimes it is just a trickle that keeps getting your panties wet or running down your legs  You have vaginal bleeding.  It is normal to have a small amount of spotting if your cervix was checked.   You don't feel your baby moving like normal.  If you don't, get you something to eat and drink and lay down and focus on feeling your baby move.  You should feel at least 10 movements in 2 hours.  If you don't, you should call the office or go to [facility].    Braxton Hicks Contractions Contractions of the uterus can occur throughout pregnancy. Contractions are not always a sign that you are in labor.  WHAT ARE BRAXTON HICKS CONTRACTIONS?  Contractions that occur before labor are called Braxton Hicks contractions, or false labor. Toward the end of pregnancy (32-34 weeks), these contractions can develop more often and may become more forceful. This is not true labor because these contractions do not result in opening (dilatation) and thinning of the cervix. They are sometimes difficult to tell apart from true labor because these contractions can be forceful and people have different pain tolerances. You should not feel embarrassed if you go to the hospital with false labor. Sometimes, the only way to tell if you are in true labor is for your health care provider to look for changes in the cervix. If there are no prenatal problems or other health problems associated with the pregnancy, it is completely safe to be sent home with false labor and await the onset of true labor. HOW CAN YOU TELL THE DIFFERENCE BETWEEN TRUE AND FALSE LABOR? False Labor  The contractions of false labor are usually shorter and not as hard as those of true labor.   The contractions are usually irregular.   The contractions are often felt in the front of  the lower abdomen and in the groin.   The contractions may go away when you walk around or change positions while lying down.   The contractions get weaker and are shorter lasting as time goes on.   The contractions do not usually become progressively stronger, regular, and closer together as with true labor.  True Labor  Contractions in true labor last 30-70 seconds, become very regular, usually become more intense, and increase in frequency.   The contractions do not go away with walking.   The discomfort is usually felt in the top of the uterus and spreads to the lower abdomen and low back.   True labor can be determined by your health care provider with an exam. This will show that the cervix is dilating and getting thinner.  WHAT TO REMEMBER  Keep up with your usual exercises and follow other instructions given by your health care provider.   Take medicines as directed by your health care provider.   Keep your regular prenatal appointments.   Eat and drink lightly if you think you are going into labor.   If Braxton Hicks contractions are making you uncomfortable:   Change your position from lying down or resting to walking, or from walking to resting.   Sit and rest in a tub of warm water.   Drink 2-3 glasses of water. Dehydration may cause these contractions.   Do slow and deep breathing several times an hour.    WHEN SHOULD I SEEK IMMEDIATE MEDICAL CARE? Seek immediate medical care if:  Your contractions become stronger, more regular, and closer together.   You have fluid leaking or gushing from your vagina.   You have a fever.   You pass blood-tinged mucus.   You have vaginal bleeding.   You have continuous abdominal pain.   You have low back pain that you never had before.   You feel your baby's head pushing down and causing pelvic pressure.   Your baby is not moving as much as it used to.    This information is not intended to  replace advice given to you by your health care provider. Make sure you discuss any questions you have with your health care provider.   Document Released: 07/31/2005 Document Revised: 08/05/2013 Document Reviewed: 05/12/2013 Elsevier Interactive Patient Education [employer].

## 2016-02-16 ENCOUNTER — Telehealth: Payer: Self-pay | Admitting: Women's Health

## 2016-02-16 DIAGNOSIS — Z8619 Personal history of other infectious and parasitic diseases: Secondary | ICD-10-CM | POA: Insufficient documentation

## 2016-02-16 DIAGNOSIS — O98813 Other maternal infectious and parasitic diseases complicating pregnancy, third trimester: Principal | ICD-10-CM

## 2016-02-16 DIAGNOSIS — A749 Chlamydial infection, unspecified: Secondary | ICD-10-CM

## 2016-02-16 LAB — GC/CHLAMYDIA PROBE AMP
Chlamydia trachomatis, NAA: POSITIVE — AB
NEISSERIA GONORRHOEAE BY PCR: NEGATIVE

## 2016-02-16 LAB — STREP GP B NAA: STREP GROUP B AG: NEGATIVE

## 2016-02-16 MED ORDER — AZITHROMYCIN 500 MG PO TABS
1000.0000 mg | ORAL_TABLET | Freq: Once | ORAL | Status: DC
Start: 2016-02-16 — End: 2016-02-21

## 2016-02-16 NOTE — Telephone Encounter (Signed)
Called and notified pt of +CT, rx for azithromycin 1gm po x 1 sent to [facility] in [place] for her and parter Chaney Malling DOB [birth year] NKDA. No sex x at least 7d from both taking med. POC for pt in 3-4wks.  Roma Schanz, CNM, WHNP-BC 02/16/2016 1:26 PM

## 2016-02-21 ENCOUNTER — Encounter: Payer: Self-pay | Admitting: Women's Health

## 2016-02-21 ENCOUNTER — Ambulatory Visit (INDEPENDENT_AMBULATORY_CARE_PROVIDER_SITE_OTHER): Payer: Medicaid Other | Admitting: Women's Health

## 2016-02-21 VITALS — BP 110/70 | HR 80 | Wt 207.0 lb

## 2016-02-21 DIAGNOSIS — Z331 Pregnant state, incidental: Secondary | ICD-10-CM

## 2016-02-21 DIAGNOSIS — Z3493 Encounter for supervision of normal pregnancy, unspecified, third trimester: Secondary | ICD-10-CM

## 2016-02-21 DIAGNOSIS — Z3483 Encounter for supervision of other normal pregnancy, third trimester: Secondary | ICD-10-CM

## 2016-02-21 DIAGNOSIS — Z3A38 38 weeks gestation of pregnancy: Secondary | ICD-10-CM

## 2016-02-21 DIAGNOSIS — Z1389 Encounter for screening for other disorder: Secondary | ICD-10-CM

## 2016-02-21 LAB — POCT URINALYSIS DIPSTICK
Glucose, UA: NEGATIVE
KETONES UA: NEGATIVE
Leukocytes, UA: NEGATIVE
Nitrite, UA: NEGATIVE
RBC UA: NEGATIVE

## 2016-02-21 NOTE — Progress Notes (Signed)
Low-risk OB appointment G2P0010 [redacted]w[redacted]d Estimated Date of Delivery: 03/09/16 BP 110/70 mmHg  Pulse 80  Wt 207 lb (93.895 kg)  LMP 03/17/2015  BP, weight, and urine reviewed.  Refer to obstetrical flow sheet for FH & FHR.  Reports good fm.  Denies regular uc's, lof, vb, or uti s/s. No complaints. Both her and partner took meds as rx'd for +CT on 7/5. Advised no sex for at least 7d from time both took meds. Will do poc on pt in 3-4wks.  Declines SVE, vtx by [facility] Reviewed labor s/s, fkc, gbs-. Plan:  Continue routine obstetrical care  F/U in 1wk for OB appointment

## 2016-02-21 NOTE — Patient Instructions (Signed)
Call the office ([phone]) or go to [facility] if:  You begin to have strong, frequent contractions  Your water breaks.  Sometimes it is a big gush of fluid, sometimes it is just a trickle that keeps getting your panties wet or running down your legs  You have vaginal bleeding.  It is normal to have a small amount of spotting if your cervix was checked.   You don't feel your baby moving like normal.  If you don't, get you something to eat and drink and lay down and focus on feeling your baby move.  You should feel at least 10 movements in 2 hours.  If you don't, you should call the office or go to [facility].    Braxton Hicks Contractions Contractions of the uterus can occur throughout pregnancy. Contractions are not always a sign that you are in labor.  WHAT ARE BRAXTON HICKS CONTRACTIONS?  Contractions that occur before labor are called Braxton Hicks contractions, or false labor. Toward the end of pregnancy (32-34 weeks), these contractions can develop more often and may become more forceful. This is not true labor because these contractions do not result in opening (dilatation) and thinning of the cervix. They are sometimes difficult to tell apart from true labor because these contractions can be forceful and people have different pain tolerances. You should not feel embarrassed if you go to the hospital with false labor. Sometimes, the only way to tell if you are in true labor is for your health care provider to look for changes in the cervix. If there are no prenatal problems or other health problems associated with the pregnancy, it is completely safe to be sent home with false labor and await the onset of true labor. HOW CAN YOU TELL THE DIFFERENCE BETWEEN TRUE AND FALSE LABOR? False Labor  The contractions of false labor are usually shorter and not as hard as those of true labor.   The contractions are usually irregular.   The contractions are often felt in the front of  the lower abdomen and in the groin.   The contractions may go away when you walk around or change positions while lying down.   The contractions get weaker and are shorter lasting as time goes on.   The contractions do not usually become progressively stronger, regular, and closer together as with true labor.  True Labor  Contractions in true labor last 30-70 seconds, become very regular, usually become more intense, and increase in frequency.   The contractions do not go away with walking.   The discomfort is usually felt in the top of the uterus and spreads to the lower abdomen and low back.   True labor can be determined by your health care provider with an exam. This will show that the cervix is dilating and getting thinner.  WHAT TO REMEMBER  Keep up with your usual exercises and follow other instructions given by your health care provider.   Take medicines as directed by your health care provider.   Keep your regular prenatal appointments.   Eat and drink lightly if you think you are going into labor.   If Braxton Hicks contractions are making you uncomfortable:   Change your position from lying down or resting to walking, or from walking to resting.   Sit and rest in a tub of warm water.   Drink 2-3 glasses of water. Dehydration may cause these contractions.   Do slow and deep breathing several times an hour.    WHEN SHOULD I SEEK IMMEDIATE MEDICAL CARE? Seek immediate medical care if:  Your contractions become stronger, more regular, and closer together.   You have fluid leaking or gushing from your vagina.   You have a fever.   You pass blood-tinged mucus.   You have vaginal bleeding.   You have continuous abdominal pain.   You have low back pain that you never had before.   You feel your baby's head pushing down and causing pelvic pressure.   Your baby is not moving as much as it used to.    This information is not intended to  replace advice given to you by your health care provider. Make sure you discuss any questions you have with your health care provider.   Document Released: 07/31/2005 Document Revised: 08/05/2013 Document Reviewed: 05/12/2013 Elsevier Interactive Patient Education [employer].

## 2016-02-23 ENCOUNTER — Telehealth: Payer: Self-pay | Admitting: Family Medicine

## 2016-02-23 NOTE — Telephone Encounter (Signed)
To front office staff.

## 2016-02-23 NOTE — Telephone Encounter (Signed)
Okay to accept.

## 2016-02-23 NOTE — Telephone Encounter (Signed)
PATIENT IS HAVING BABY IN A WEEK, HAS MEDICAID, CAN WE TAKE HIM AS PATIENT  [phone]

## 2016-02-23 NOTE — Telephone Encounter (Signed)
To MD

## 2016-02-28 ENCOUNTER — Ambulatory Visit (INDEPENDENT_AMBULATORY_CARE_PROVIDER_SITE_OTHER): Payer: Medicaid Other | Admitting: Obstetrics & Gynecology

## 2016-02-28 ENCOUNTER — Encounter: Payer: Self-pay | Admitting: Obstetrics & Gynecology

## 2016-02-28 VITALS — BP 110/80 | HR 74 | Wt 206.0 lb

## 2016-02-28 DIAGNOSIS — Z3483 Encounter for supervision of other normal pregnancy, third trimester: Secondary | ICD-10-CM

## 2016-02-28 DIAGNOSIS — Z3493 Encounter for supervision of normal pregnancy, unspecified, third trimester: Secondary | ICD-10-CM

## 2016-02-28 DIAGNOSIS — Z331 Pregnant state, incidental: Secondary | ICD-10-CM

## 2016-02-28 DIAGNOSIS — Z3A39 39 weeks gestation of pregnancy: Secondary | ICD-10-CM | POA: Diagnosis not present

## 2016-02-28 DIAGNOSIS — Z1389 Encounter for screening for other disorder: Secondary | ICD-10-CM

## 2016-02-28 LAB — POCT URINALYSIS DIPSTICK
Glucose, UA: NEGATIVE
Ketones, UA: NEGATIVE
Leukocytes, UA: NEGATIVE
NITRITE UA: NEGATIVE
PROTEIN UA: NEGATIVE
RBC UA: NEGATIVE

## 2016-02-28 NOTE — Progress Notes (Signed)
G2P0010 [redacted]w[redacted]d Estimated Date of Delivery: 03/09/16  Blood pressure 110/80, pulse 74, weight 206 lb (93.441 kg), last menstrual period 03/17/2015, unknown if currently breastfeeding.   BP weight and urine results all reviewed and noted.  Please refer to the obstetrical flow sheet for the fundal height and fetal heart rate documentation:  Patient reports good fetal movement, denies any bleeding and no rupture of membranes symptoms or regular contractions. Patient is without complaints. All questions were answered.  Orders Placed This Encounter  Procedures  . POCT urinalysis dipstick    Plan:  Continued routine obstetrical care, dropped lower  No Follow-up on file.

## 2016-02-29 ENCOUNTER — Telehealth: Payer: Self-pay | Admitting: Women's Health

## 2016-02-29 NOTE — Telephone Encounter (Signed)
Pt states loss mucus plug, notice spotting just when she wipes since last night, +FM, no gush of fluids, no cramping. Pt informed to continue to monitor, if cramping, pain, vaginal bleeding, gush of fluids, decrease FM, contractions 5-10 min apart to go to [facility]. Pt verbalized understanding.

## 2016-03-01 ENCOUNTER — Inpatient Hospital Stay (HOSPITAL_COMMUNITY)
Admission: AD | Admit: 2016-03-01 | Discharge: 2016-03-03 | DRG: 775 | Disposition: A | Discharge status: Home / Self Care | Payer: Medicaid Other | Source: Ambulatory Visit | Attending: Obstetrics & Gynecology | Admitting: Obstetrics & Gynecology

## 2016-03-01 ENCOUNTER — Encounter (HOSPITAL_COMMUNITY): Payer: Self-pay | Admitting: *Deleted

## 2016-03-01 DIAGNOSIS — Z8249 Family history of ischemic heart disease and other diseases of the circulatory system: Secondary | ICD-10-CM | POA: Diagnosis not present

## 2016-03-01 DIAGNOSIS — Z3A38 38 weeks gestation of pregnancy: Secondary | ICD-10-CM

## 2016-03-01 DIAGNOSIS — Z820 Family history of epilepsy and other diseases of the nervous system: Secondary | ICD-10-CM | POA: Diagnosis not present

## 2016-03-01 DIAGNOSIS — Z3493 Encounter for supervision of normal pregnancy, unspecified, third trimester: Secondary | ICD-10-CM

## 2016-03-01 DIAGNOSIS — Z3403 Encounter for supervision of normal first pregnancy, third trimester: Secondary | ICD-10-CM | POA: Diagnosis present

## 2016-03-01 DIAGNOSIS — IMO0001 Reserved for inherently not codable concepts without codable children: Secondary | ICD-10-CM

## 2016-03-01 LAB — TYPE AND SCREEN
ABO/RH(D): O POS
Antibody Screen: NEGATIVE

## 2016-03-01 LAB — CBC
HEMATOCRIT: 33.7 % — AB (ref 36.0–46.0)
HEMOGLOBIN: 11.4 g/dL — AB (ref 12.0–15.0)
MCH: 27.6 pg (ref 26.0–34.0)
MCHC: 33.8 g/dL (ref 30.0–36.0)
MCV: 81.6 fL (ref 78.0–100.0)
Platelets: 151 10*3/uL (ref 150–400)
RBC: 4.13 MIL/uL (ref 3.87–5.11)
RDW: 13.6 % (ref 11.5–15.5)
WBC: 10.2 10*3/uL (ref 4.0–10.5)

## 2016-03-01 LAB — URINALYSIS, ROUTINE W REFLEX MICROSCOPIC
BILIRUBIN URINE: NEGATIVE
Glucose, UA: NEGATIVE mg/dL
HGB URINE DIPSTICK: NEGATIVE
Ketones, ur: 40 mg/dL — AB
Leukocytes, UA: NEGATIVE
Nitrite: NEGATIVE
PH: 7 (ref 5.0–8.0)
Protein, ur: NEGATIVE mg/dL
SPECIFIC GRAVITY, URINE: 1.01 (ref 1.005–1.030)

## 2016-03-01 LAB — ABO/RH: ABO/RH(D): O POS

## 2016-03-01 MED ORDER — LIDOCAINE HCL (PF) 1 % IJ SOLN
30.0000 mL | INTRAMUSCULAR | Status: DC | PRN
  Administered 2016-03-01: 30 mL via SUBCUTANEOUS
  Filled 2016-03-01: qty 30

## 2016-03-01 MED ORDER — FLEET ENEMA 7-19 GM/118ML RE ENEM: 1.0000 | ENEMA | RECTAL | Status: DC | PRN

## 2016-03-01 MED ORDER — OXYTOCIN 40 UNITS IN LACTATED RINGERS INFUSION - SIMPLE MED
2.5000 [IU]/h | INTRAVENOUS | Status: DC
  Filled 2016-03-01: qty 1000

## 2016-03-01 MED ORDER — OXYCODONE-ACETAMINOPHEN 5-325 MG PO TABS: 1.0000 | ORAL_TABLET | ORAL | Status: DC | PRN

## 2016-03-01 MED ORDER — ONDANSETRON HCL 4 MG/2ML IJ SOLN: 4.0000 mg | Freq: Four times a day (QID) | INTRAMUSCULAR | Status: DC | PRN

## 2016-03-01 MED ORDER — LACTATED RINGERS IV SOLN
INTRAVENOUS | Status: DC
  Administered 2016-03-01: 17:00:00 via INTRAVENOUS

## 2016-03-01 MED ORDER — OXYCODONE-ACETAMINOPHEN 5-325 MG PO TABS: 2.0000 | ORAL_TABLET | ORAL | Status: DC | PRN

## 2016-03-01 MED ORDER — OXYTOCIN BOLUS FROM INFUSION
500.0000 mL | INTRAVENOUS | Status: DC
  Administered 2016-03-01: 500 mL via INTRAVENOUS

## 2016-03-01 MED ORDER — LACTATED RINGERS IV SOLN: 500.0000 mL | INTRAVENOUS | Status: DC | PRN

## 2016-03-01 MED ORDER — FENTANYL CITRATE (PF) 100 MCG/2ML IJ SOLN
100.0000 ug | INTRAMUSCULAR | Status: DC | PRN
  Administered 2016-03-01 (×2): 100 ug via INTRAVENOUS
  Filled 2016-03-01 (×2): qty 2

## 2016-03-01 MED ORDER — SOD CITRATE-CITRIC ACID 500-334 MG/5ML PO SOLN: 30.0000 mL | ORAL | Status: DC | PRN

## 2016-03-01 MED ORDER — ACETAMINOPHEN 325 MG PO TABS: 650.0000 mg | ORAL_TABLET | ORAL | Status: DC | PRN

## 2016-03-01 NOTE — Anesthesia Pain Management Evaluation Note (Signed)
  CRNA Pain Management Visit Note  Patient: Joan Andrews, 20 y.o., female  "Hello I am a member of the anesthesia team at [facility]. We have an anesthesia team available at all times to provide care throughout the hospital, including epidural management and anesthesia for C-section. I don't know your plan for the delivery whether it a natural birth, water birth, IV sedation, nitrous supplementation, doula or epidural, but we want to meet your pain goals."   1.Was your pain managed to your expectations on prior hospitalizations?   Yes   2.What is your expectation for pain management during this hospitalization?     Epidural and IV pain meds  3.How can we help you reach that goal? IV pain medications, possibly Epidural.  Record the patient's initial score and the patient's pain goal.   Pain: 9 (IV pain meds being administered by L&D RN)  Pain Goal: 8 [facility] wants you to be able to say your pain was always managed very well.  Demarr Kluever L 03/01/2016

## 2016-03-01 NOTE — H&P (Signed)
Joan Andrews is a G2P0010  20 y.o. female at [redacted]w[redacted]d presenting in active labor expecting normal vaginal delivery.   Maternal Medical History:  Reason for admission: Contractions.  Nausea.   Pt was diagnosed and began treatment for chlamydia on 02/16/16 w/ azithromycin 500mg  bid for 5 days.  OB History    Gravida Para Term Preterm AB TAB SAB Ectopic Multiple Living   2    1  1         Past Medical History  Diagnosis Date  . Contraceptive management 12/26/2013  . Pregnant 07/14/2014  . Miscarriage   . Recurrent tonsillitis   . Bronchitis     as a child only  . Supervision of normal pregnancy in first trimester 07/27/2015     [place] Initiated Care at   7+5 weeks FOB  Joan Andrews 20 yo bm Dating By  LMP and [place] Pap  07/27/15 GC/CT Initial:                36+wks: Genetic Screen NT/IT:  CF screen  Anatomic [place]  Flu vaccine  Tdap Recommended ~ 28wks Glucose Screen  2 hr GBS  Feed Preference  Contraception  Circumcision  Childbirth Classes  Pediatrician     Past Surgical History  Procedure Laterality Date  . Hernia repair     Family History: family history includes Alzheimer's disease in her maternal grandmother; Hypertension in her father and mother. Social History:  reports that she has never smoked. She has never used smokeless tobacco. She reports that she does not drink alcohol or use illicit drugs.   Prenatal Transfer Tool  Maternal Diabetes: No Genetic Screening: Normal Maternal Ultrasounds/Referrals: Normal Fetal Ultrasounds or other Referrals:  None Maternal Substance Abuse:  No Significant Maternal Medications: multi-vitamin Significant Maternal Lab Results:  Lab values include: Group B Strep negative Other Comments:  None  Review of Systems  Constitutional: Negative for fever and chills.  Eyes: Negative for blurred vision.  Respiratory: Negative for cough.   Cardiovascular: Negative for chest pain and orthopnea.  Gastrointestinal: Negative for  heartburn and nausea.  Neurological: Negative for headaches.    Dilation: 7 Effacement (%): 100 Station: -1 Exam by:: L.Crezendo RNC Blood pressure 124/66, pulse 74, temperature 97.6 F (36.4 C), temperature source Oral, resp. rate 20, height 5\' 4"  (1.626 m), weight 206 lb (93.441 kg), last menstrual period 03/17/2015, unknown if currently breastfeeding. Maternal Exam:  Uterine Assessment: Contraction strength is moderate.  Contraction frequency is regular.   Pelvis: adequate for delivery.      Physical Exam  Constitutional: She appears well-developed and well-nourished.  Cardiovascular: Normal rate and regular rhythm.   Respiratory: Effort normal and breath sounds normal.  GI: There is tenderness.    Prenatal labs: ABO, Rh: --/--/O POS (07/19 1715) Antib ody: NEG (07/19 1715) Rubella: 2.04 (12/13 1519) RPR: Non Reactive (05/02 0945)  HBsAg: Negative (12/13 1519)  HIV: Non Reactive (05/02 0945)  GBS: Negative (07/03 1345)    Assessment/Plan: Joan Andrews is a G2P0010  20 y.o. female at [redacted]w[redacted]d presenting in active labor.  Labor: membranes intact at time of exam Fetal Wellbeing: Category 1 reassuring Pain Control: fentanyl 164mcg/hr Anticipated MOD: Vaginal, routine intrapartum care ID: GBS neg; recent treatment for Chlamydia,test for cure in 4 weeks    Casimer Leek, MD PGY-1, MPH 03/01/2016, 7:24 PM

## 2016-03-01 NOTE — Progress Notes (Signed)
Dr. Helane Rima notified of a G2P0 at [redacted]w[redacted]d.  Notified that pt is 6.5cm, -2, 100%, and vertex.  Notified that GBS is negative.  Provider states to put in admission orders.

## 2016-03-01 NOTE — MAU Note (Signed)
Pt states she is having contractions that are 8-10 minutes apart.  Pt states the contractions started two days ago.  Pt states she is feeling the baby move.  Pt states she is having mucus discharge but no leaking of fluid.  Pt states she is having some vaginal bleeding.  Pt states she is feeling the baby move.

## 2016-03-02 ENCOUNTER — Telehealth: Payer: Self-pay | Admitting: Family Medicine

## 2016-03-02 DIAGNOSIS — Z3A38 38 weeks gestation of pregnancy: Secondary | ICD-10-CM

## 2016-03-02 LAB — RPR: RPR: NONREACTIVE

## 2016-03-02 MED ORDER — SENNOSIDES-DOCUSATE SODIUM 8.6-50 MG PO TABS
2.0000 | ORAL_TABLET | ORAL | Status: DC
  Filled 2016-03-02 (×2): qty 2

## 2016-03-02 MED ORDER — PRENATAL MULTIVITAMIN CH: 1.0000 | ORAL_TABLET | Freq: Every day | ORAL | Status: DC

## 2016-03-02 MED ORDER — ZOLPIDEM TARTRATE 5 MG PO TABS: 5.0000 mg | ORAL_TABLET | Freq: Every evening | ORAL | Status: DC | PRN

## 2016-03-02 MED ORDER — WITCH HAZEL-GLYCERIN EX PADS: 1.0000 "application " | MEDICATED_PAD | CUTANEOUS | Status: DC | PRN

## 2016-03-02 MED ORDER — DIPHENHYDRAMINE HCL 25 MG PO CAPS: 25.0000 mg | ORAL_CAPSULE | Freq: Four times a day (QID) | ORAL | Status: DC | PRN

## 2016-03-02 MED ORDER — ONDANSETRON HCL 4 MG PO TABS: 4.0000 mg | ORAL_TABLET | ORAL | Status: DC | PRN

## 2016-03-02 MED ORDER — COMPLETENATE 29-1 MG PO CHEW
1.0000 | CHEWABLE_TABLET | Freq: Every day | ORAL | Status: DC
  Administered 2016-03-02 – 2016-03-03 (×2): 1 via ORAL
  Filled 2016-03-02 (×3): qty 1

## 2016-03-02 MED ORDER — ONDANSETRON HCL 4 MG/2ML IJ SOLN: 4.0000 mg | INTRAMUSCULAR | Status: DC | PRN

## 2016-03-02 MED ORDER — IBUPROFEN 600 MG PO TABS
600.0000 mg | ORAL_TABLET | Freq: Four times a day (QID) | ORAL | Status: DC
  Administered 2016-03-02: 600 mg via ORAL
  Filled 2016-03-02 (×3): qty 1

## 2016-03-02 MED ORDER — ACETAMINOPHEN 325 MG PO TABS: 650.0000 mg | ORAL_TABLET | ORAL | Status: DC | PRN

## 2016-03-02 MED ORDER — IBUPROFEN 100 MG/5ML PO SUSP
600.0000 mg | Freq: Four times a day (QID) | ORAL | Status: DC
  Administered 2016-03-02 – 2016-03-03 (×5): 600 mg via ORAL
  Filled 2016-03-02 (×9): qty 30

## 2016-03-02 MED ORDER — TETANUS-DIPHTH-ACELL PERTUSSIS 5-2.5-18.5 LF-MCG/0.5 IM SUSP: 0.5000 mL | Freq: Once | INTRAMUSCULAR | Status: DC

## 2016-03-02 MED ORDER — COCONUT OIL OIL: 1.0000 "application " | TOPICAL_OIL | Status: DC | PRN

## 2016-03-02 MED ORDER — SIMETHICONE 80 MG PO CHEW: 80.0000 mg | CHEWABLE_TABLET | ORAL | Status: DC | PRN

## 2016-03-02 MED ORDER — DIBUCAINE 1 % RE OINT: 1.0000 "application " | TOPICAL_OINTMENT | RECTAL | Status: DC | PRN

## 2016-03-02 MED ORDER — BENZOCAINE-MENTHOL 20-0.5 % EX AERO
1.0000 "application " | INHALATION_SPRAY | CUTANEOUS | Status: DC | PRN
  Filled 2016-03-02: qty 56

## 2016-03-02 NOTE — Progress Notes (Signed)
UR chart review completed.  

## 2016-03-02 NOTE — Progress Notes (Signed)
POSTPARTUM PROGRESS NOTE  Post Partum Day 1 Subjective:  Joan Andrews is a 20 y.o. G2P1011 [redacted]w[redacted]d s/p SVD.  No acute events overnight.  Pt denies problems with ambulating, voiding or po intake.  She denies nausea or vomiting.  Pain is moderately controlled.  She has had flatus. She has not had bowel movement.  Lochia Moderate.   Objective: Blood pressure 106/63, pulse 72, temperature 97.9 F (36.6 C), temperature source Oral, resp. rate 18, height 5\' 4"  (1.626 m), weight 206 lb (93.441 kg), last menstrual period 03/17/2015, unknown if currently breastfeeding.  Physical Exam:  General: alert, cooperative and no distress Lochia:normal flow Chest: CTAB Heart: RRR no m/r/g Abdomen: +BS, soft, nontender,  Uterine Fundus: firm,  DVT Evaluation: No calf swelling or tenderness Extremities: trace edema   Recent Labs  03/01/16 1715  HGB 11.4*  HCT 33.7*    Assessment/Plan:  ASSESSMENT: Joan Andrews is a 20 y.o. [id number] [redacted]w[redacted]d s/p svd  Plan for discharge tomorrow   LOS: 1 day   Joan Andrews 03/02/2016, 8:45 AM

## 2016-03-02 NOTE — Telephone Encounter (Signed)
Patient is calling to see if we can see her unborn baby when he/she gets here, the baby will have medicaid

## 2016-03-02 NOTE — Lactation Note (Signed)
This note was copied from a baby's chart. Lactation Consultation Note  Patient Name: Joan Andrews [id number] Date: 03/02/2016 Reason for consult: Initial assessment;Difficult latch Initial visit made.  Breastfeeding consultation services and support information given to patient.  Baby is 61 hours old and unable to latch due to thick edematous areola.  Plan discussed with mom.  Instructed to put bra and breast shells on.  Symphony pump set up and initiated.  Instructed to pump both breasts every 3 hours for 15 minutes and give any expressed milk back to baby.  Mom states she has a DEBP at home.  Reassured mom that tissue should improve with pumping and shells.  Encouraged to call for assist/concerns prn.  Maternal Data    Feeding Feeding Type: Breast Fed Nipple Type: Slow - flow Length of feed: 0 min  LATCH Score/Interventions Latch: Too sleepy or reluctant, no latch achieved, no sucking elicited.  Audible Swallowing: None  Type of Nipple: Flat  Comfort (Breast/Nipple): Soft / non-tender     Hold (Positioning): Assistance needed to correctly position infant at breast and maintain latch.  LATCH Score: 4  Lactation Tools Discussed/Used WIC Program: No Pump Review: Setup, frequency, and cleaning;Milk Storage Initiated by:: [place] Date initiated:: 03/03/16   Consult Status Consult Status: Follow-up Date: 03/03/16 Follow-up type: In-patient    Ave Filter 03/02/2016, 12:18 PM

## 2016-03-03 NOTE — Lactation Note (Addendum)
This note was copied from a baby's chart. Lactation Consultation Note  Patient Name: Joan Andrews [id number] Date: 03/03/2016 Reason for consult: Follow-up assessment;Difficult latch  Baby 27 hours old. Parents report that baby just had a bottle of 23 ml of formula. However, mom is wanting assistance with breastfeeding. Demonstrated positioning of baby at right breast in football position. Mom has large, pendulous breasts. Both nipples are flat. Left nipple everts slightly with stimulation, and mom reports that she has been able to latch baby to left nipple. Right nipple is completely flat, non-compressible, and entire nipple and part of areola inverts with compression. Demonstrated how to use folded cloth to elevate breast, and then used hand pump to attempt to evert right nipple. Right nipple did not evert at all. However, mom has colostrum flowing bilaterally. Mom has a DEBP at home. Discussed with mom that as long as baby going to the breast with each BF, her personal pump should be fine to maintain her breast milk supply. Discussed benefits of hospital-grade pump if baby not nursing during first 2 weeks.   Enc mom to put baby to breast first with cues, and then continue to supplement with EBM/formula. Enc mom to post pump after each feeding. Discussed supply and demand, milk coming to volume, and EBM storage guidelines. Referred parents to Baby and Me booklet for EBM guidelines and number of diapers to expect by day of life. Mom aware of OP/BFSG and [place] phone line assistance after D/C. Parents had basic bf and baby care questions which were answered.  Maternal Data Has patient been taught Hand Expression?: Yes Does the patient have breastfeeding experience prior to this delivery?: No  Feeding Feeding Type: Bottle Fed - Formula Nipple Type: Slow - flow  LATCH Score/Interventions Latch: Too sleepy or reluctant, no latch achieved, no sucking elicited. (Baby just had a bottle of formula,  but demonstrated how to latch at next feeding--position etc. )  Audible Swallowing: None  Type of Nipple: Flat  Comfort (Breast/Nipple): Soft / non-tender     Hold (Positioning): Assistance needed to correctly position infant at breast and maintain latch. Intervention(s): Breastfeeding basics reviewed;Support Pillows;Position options;Skin to skin  LATCH Score: 4  Lactation Tools Discussed/Used     Consult Status Consult Status: PRN    Inocente Salles 03/03/2016, 11:36 AM

## 2016-03-03 NOTE — Discharge Instructions (Signed)
Breastfeeding Deciding to breastfeed is one of the best choices you can make for you and your baby. A change in hormones during pregnancy causes your breast tissue to grow and increases the number and size of your milk ducts. These hormones also allow proteins, sugars, and fats from your blood supply to make breast milk in your milk-producing glands. Hormones prevent breast milk from being released before your baby is born as well as prompt milk flow after birth. Once breastfeeding has begun, thoughts of your baby, as well as his or her sucking or crying, can stimulate the release of milk from your milk-producing glands.  BENEFITS OF BREASTFEEDING For Your Baby  Your first milk (colostrum) helps your baby's digestive system function better.  There are antibodies in your milk that help your baby fight off infections.  Your baby has a lower incidence of asthma, allergies, and sudden infant death syndrome.  The nutrients in breast milk are better for your baby than infant formulas and are designed uniquely for your baby's needs.  Breast milk improves your baby's brain development.  Your baby is less likely to develop other conditions, such as childhood obesity, asthma, or type 2 diabetes mellitus. For You  Breastfeeding helps to create a very special bond between you and your baby.  Breastfeeding is convenient. Breast milk is always available at the correct temperature and costs nothing.  Breastfeeding helps to burn calories and helps you lose the weight gained during pregnancy.  Breastfeeding makes your uterus contract to its prepregnancy size faster and slows bleeding (lochia) after you give birth.   Breastfeeding helps to lower your risk of developing type 2 diabetes mellitus, osteoporosis, and breast or ovarian cancer later in life. SIGNS THAT YOUR BABY IS HUNGRY Early Signs of Hunger  Increased alertness or activity.  Stretching.  Movement of the head from side to  side.  Movement of the head and opening of the mouth when the corner of the mouth or cheek is stroked (rooting).  Increased sucking sounds, smacking lips, cooing, sighing, or squeaking.  Hand-to-mouth movements.  Increased sucking of fingers or hands. Late Signs of Hunger  Fussing.  Intermittent crying. Extreme Signs of Hunger Signs of extreme hunger will require calming and consoling before your baby will be able to breastfeed successfully. Do not wait for the following signs of extreme hunger to occur before you initiate breastfeeding:  Restlessness.  A loud, strong cry.  Screaming. BREASTFEEDING BASICS Breastfeeding Initiation  Find a comfortable place to sit or lie down, with your neck and back well supported.  Place a pillow or rolled up blanket under your baby to bring him or her to the level of your breast (if you are seated). Nursing pillows are specially designed to help support your arms and your baby while you breastfeed.  Make sure that your baby's abdomen is facing your abdomen.  Gently massage your breast. With your fingertips, massage from your chest wall toward your nipple in a circular motion. This encourages milk flow. You may need to continue this action during the feeding if your milk flows slowly.  Support your breast with 4 fingers underneath and your thumb above your nipple. Make sure your fingers are well away from your nipple and your baby's mouth.  Stroke your baby's lips gently with your finger or nipple.  When your baby's mouth is open wide enough, quickly bring your baby to your breast, placing your entire nipple and as much of the colored area around your nipple (  areola) as possible into your baby's mouth.  More areola should be visible above your baby's upper lip than below the lower lip.  Your baby's tongue should be between his or her lower gum and your breast.  Ensure that your baby's mouth is correctly positioned around your nipple  (latched). Your baby's lips should create a seal on your breast and be turned out (everted).  It is common for your baby to suck about 2-3 minutes in order to start the flow of breast milk. Latching Teaching your baby how to latch on to your breast properly is very important. An improper latch can cause nipple pain and decreased milk supply for you and poor weight gain in your baby. Also, if your baby is not latched onto your nipple properly, he or she may swallow some air during feeding. This can make your baby fussy. Burping your baby when you switch breasts during the feeding can help to get rid of the air. However, teaching your baby to latch on properly is still the best way to prevent fussiness from swallowing air while breastfeeding. Signs that your baby has successfully latched on to your nipple:  Silent tugging or silent sucking, without causing you pain.  Swallowing heard between every 3-4 sucks.  Muscle movement above and in front of his or her ears while sucking. Signs that your baby has not successfully latched on to nipple:  Sucking sounds or smacking sounds from your baby while breastfeeding.  Nipple pain. If you think your baby has not latched on correctly, slip your finger into the corner of your baby's mouth to break the suction and place it between your baby's gums. Attempt breastfeeding initiation again. Signs of Successful Breastfeeding Signs from your baby:  A gradual decrease in the number of sucks or complete cessation of sucking.  Falling asleep.  Relaxation of his or her body.  Retention of a small amount of milk in his or her mouth.  Letting go of your breast by himself or herself. Signs from you:  Breasts that have increased in firmness, weight, and size 1-3 hours after feeding.  Breasts that are softer immediately after breastfeeding.  Increased milk volume, as well as a change in milk consistency and color by the fifth day of breastfeeding.  Nipples  that are not sore, cracked, or bleeding. Signs That Your Randel Books is Getting Enough Milk  Wetting at least 3 diapers in a 24-hour period. The urine should be clear and pale yellow by age 375 days.  At least 3 stools in a 24-hour period by age 375 days. The stool should be soft and yellow.  At least 3 stools in a 24-hour period by age 22 days. The stool should be seedy and yellow.  No loss of weight greater than 10% of birth weight during the first 35 days of age.  Average weight gain of 4-7 ounces (113-198 g) per week after age 65 days.  Consistent daily weight gain by age 105 days, without weight loss after the age of 2 weeks. After a feeding, your baby may spit up a small amount. This is common. BREASTFEEDING FREQUENCY AND DURATION Frequent feeding will help you make more milk and can prevent sore nipples and breast engorgement. Breastfeed when you feel the need to reduce the fullness of your breasts or when your baby shows signs of hunger. This is called "breastfeeding on demand." Avoid introducing a pacifier to your baby while you are working to establish breastfeeding (the first 4-6 weeks  after your baby is born). After this time you may choose to use a pacifier. Research has shown that pacifier use during the first year of a baby's life decreases the risk of sudden infant death syndrome (SIDS). Allow your baby to feed on each breast as long as he or she wants. Breastfeed until your baby is finished feeding. When your baby unlatches or falls asleep while feeding from the first breast, offer the second breast. Because newborns are often sleepy in the first few weeks of life, you may need to awaken your baby to get him or her to feed. Breastfeeding times will vary from baby to baby. However, the following rules can serve as a guide to help you ensure that your baby is properly fed:  Newborns (babies 65 weeks of age or younger) may breastfeed every 1-3 hours.  Newborns should not go longer than 3 hours  during the day or 5 hours during the night without breastfeeding.  You should breastfeed your baby a minimum of 8 times in a 24-hour period until you begin to introduce solid foods to your baby at around 69 months of age. BREAST MILK PUMPING Pumping and storing breast milk allows you to ensure that your baby is exclusively fed your breast milk, even at times when you are unable to breastfeed. This is especially important if you are going back to work while you are still breastfeeding or when you are not able to be present during feedings. Your lactation consultant can give you guidelines on how long it is safe to store breast milk. A breast pump is a machine that allows you to pump milk from your breast into a sterile bottle. The pumped breast milk can then be stored in a refrigerator or freezer. Some breast pumps are operated by hand, while others use electricity. Ask your lactation consultant which type will work best for you. Breast pumps can be purchased, but some hospitals and breastfeeding support groups lease breast pumps on a monthly basis. A lactation consultant can teach you how to hand express breast milk, if you prefer not to use a pump. CARING FOR YOUR BREASTS WHILE YOU BREASTFEED Nipples can become dry, cracked, and sore while breastfeeding. The following recommendations can help keep your breasts moisturized and healthy:  Avoid using soap on your nipples.  Wear a supportive bra. Although not required, special nursing bras and tank tops are designed to allow access to your breasts for breastfeeding without taking off your entire bra or top. Avoid wearing underwire-style bras or extremely tight bras.  Air dry your nipples for 3-23mnutes after each feeding.  Use only cotton bra pads to absorb leaked breast milk. Leaking of breast milk between feedings is normal.  Use lanolin on your nipples after breastfeeding. Lanolin helps to maintain your skin's normal moisture barrier. If you use  pure lanolin, you do not need to wash it off before feeding your baby again. Pure lanolin is not toxic to your baby. You may also hand express a few drops of breast milk and gently massage that milk into your nipples and allow the milk to air dry. In the first few weeks after giving birth, some women experience extremely full breasts (engorgement). Engorgement can make your breasts feel heavy, warm, and tender to the touch. Engorgement peaks within 3-5 days after you give birth. The following recommendations can help ease engorgement:  Completely empty your breasts while breastfeeding or pumping. You may want to start by applying warm, moist heat (in  the shower or with warm water-soaked hand towels) just before feeding or pumping. This increases circulation and helps the milk flow. If your baby does not completely empty your breasts while breastfeeding, pump any extra milk after he or she is finished.  Wear a snug bra (nursing or regular) or tank top for 1-2 days to signal your body to slightly decrease milk production.  Apply ice packs to your breasts, unless this is too uncomfortable for you.  Make sure that your baby is latched on and positioned properly while breastfeeding. If engorgement persists after 48 hours of following these recommendations, contact your health care provider or a Science writer. OVERALL HEALTH CARE RECOMMENDATIONS WHILE BREASTFEEDING  Eat healthy foods. Alternate between meals and snacks, eating 3 of each per day. Because what you eat affects your breast milk, some of the foods may make your baby more irritable than usual. Avoid eating these foods if you are sure that they are negatively affecting your baby.  Drink milk, fruit juice, and water to satisfy your thirst (about 10 glasses a day).  Rest often, relax, and continue to take your prenatal vitamins to prevent fatigue, stress, and anemia.  Continue breast self-awareness checks.  Avoid chewing and smoking  tobacco. Chemicals from cigarettes that pass into breast milk and exposure to secondhand smoke may harm your baby.  Avoid alcohol and drug use, including marijuana. Some medicines that may be harmful to your baby can pass through breast milk. It is important to ask your health care provider before taking any medicine, including all over-the-counter and prescription medicine as well as vitamin and herbal supplements. It is possible to become pregnant while breastfeeding. If birth control is desired, ask your health care provider about options that will be safe for your baby. SEEK MEDICAL CARE IF:  You feel like you want to stop breastfeeding or have become frustrated with breastfeeding.  You have painful breasts or nipples.  Your nipples are cracked or bleeding.  Your breasts are red, tender, or warm.  You have a swollen area on either breast.  You have a fever or chills.  You have nausea or vomiting.  You have drainage other than breast milk from your nipples.  Your breasts do not become full before feedings by the fifth day after you give birth.  You feel sad and depressed.  Your baby is too sleepy to eat well.  Your baby is having trouble sleeping.   Your baby is wetting less than 3 diapers in a 24-hour period.  Your baby has less than 3 stools in a 24-hour period.  Your baby's skin or the white part of his or her eyes becomes yellow.   Your baby is not gaining weight by 28 days of age. SEEK IMMEDIATE MEDICAL CARE IF:  Your baby is overly tired (lethargic) and does not want to wake up and feed.  Your baby develops an unexplained fever.   This information is not intended to replace advice given to you by your health care provider. Make sure you discuss any questions you have with your health care provider.   Document Released: 07/31/2005 Document Revised: 04/21/2015 Document Reviewed: 01/22/2013 Elsevier Interactive Patient Education 2016 Else[employer] Vaginal  Delivery, Care After Refer to this sheet in the next few weeks. These discharge instructions provide you with information on caring for yourself after delivery. Your health care provider may also give you specific instructions. Your treatment has been planned according to the most current medical practices available, but  problems sometimes occur. Call your health care provider if you have any problems or questions after you go home. HOME CARE INSTRUCTIONS  Take over-the-counter or prescription medicines only as directed by your health care provider or pharmacist.  Do not drink alcohol, especially if you are breastfeeding or taking medicine to relieve pain.  Do not chew or smoke tobacco.  Do not use illegal drugs.  Continue to use good perineal care. Good perineal care includes:  Wiping your perineum from front to back.  Keeping your perineum clean.  Do not use tampons or douche until your health care provider says it is okay.  Shower, wash your hair, and take tub baths as directed by your health care provider.  Wear a well-fitting bra that provides breast support.  Eat healthy foods.  Drink enough fluids to keep your urine clear or pale yellow.  Eat high-fiber foods such as whole grain cereals and breads, brown rice, beans, and fresh fruits and vegetables every day. These foods may help prevent or relieve constipation.  Follow your health care provider's recommendations regarding resumption of activities such as climbing stairs, driving, lifting, exercising, or traveling.  Talk to your health care provider about resuming sexual activities. Resumption of sexual activities is dependent upon your risk of infection, your rate of healing, and your comfort and desire to resume sexual activity.  Try to have someone help you with your household activities and your newborn for at least a few days after you leave the hospital.  Rest as much as possible. Try to rest or take a nap when your  newborn is sleeping.  Increase your activities gradually.  Keep all of your scheduled postpartum appointments. It is very important to keep your scheduled follow-up appointments. At these appointments, your health care provider will be checking to make sure that you are healing physically and emotionally. SEEK MEDICAL CARE IF:   You are passing large clots from your vagina. Save any clots to show your health care provider.  You have a foul smelling discharge from your vagina.  You have trouble urinating.  You are urinating frequently.  You have pain when you urinate.  You have a change in your bowel movements.  You have increasing redness, pain, or swelling near your vaginal incision (episiotomy) or vaginal tear.  You have pus draining from your episiotomy or vaginal tear.  Your episiotomy or vaginal tear is separating.  You have painful, hard, or reddened breasts.  You have a severe headache.  You have blurred vision or see spots.  You feel sad or depressed.  You have thoughts of hurting yourself or your newborn.  You have questions about your care, the care of your newborn, or medicines.  You are dizzy or light-headed.  You have a rash.  You have nausea or vomiting.  You were breastfeeding and have not had a menstrual period within 12 weeks after you stopped breastfeeding.  You are not breastfeeding and have not had a menstrual period by the 12th week after delivery.  You have a fever. SEEK IMMEDIATE MEDICAL CARE IF:   You have persistent pain.  You have chest pain.  You have shortness of breath.  You faint.  You have leg pain.  You have stomach pain.  Your vaginal bleeding saturates two or more sanitary pads in 1 hour.   This information is not intended to replace advice given to you by your health care provider. Make sure you discuss any questions you have with your health  care provider.   Document Released: 07/28/2000 Document Revised: 04/21/2015  Document Reviewed: 03/27/2012 Elsevier Interactive Patient Education [employer].

## 2016-03-03 NOTE — Discharge Summary (Signed)
OB Discharge Summary     Patient Name: Joan Andrews DOB: [birth year] MRN: [id number]  Date of admission: 03/01/2016 Delivering MD: Serita Grammes D   Date of discharge: 03/03/2016  Admitting diagnosis: 39w intense ctx 8-10 min, lwr abd pressure Intrauterine pregnancy: [redacted]w[redacted]d     Secondary diagnosis:  Active Problems:   Active labor   Active labor at term  Additional problems: none     Discharge diagnosis: Term Pregnancy Delivered                                                                                                Post partum procedures:none  Augmentation: none  Complications: None  Hospital course:  Onset of Labor With Vaginal Delivery     20 y.o. yo G2P1011 at [redacted]w[redacted]d was admitted in Active Labor on 03/01/2016. Patient had an uncomplicated labor course as follows:  Membrane Rupture Time/Date: 6:26 PM ,03/01/2016   Intrapartum Procedures: Episiotomy: None [1]                                         Lacerations:  2nd degree [3];Vaginal [6]  Patient had a delivery of a Viable infant. 03/01/2016  Information for the patient's newborn:  [place], [place] [id number]  Delivery Method: Vaginal, Spontaneous Delivery (Filed from Delivery Summary)    Pateint had an uncomplicated postpartum course.  She is ambulating, tolerating a regular diet, passing flatus, and urinating well. Patient is discharged home in stable condition on 03/03/2016.    Physical exam  Filed Vitals:   03/02/16 0345 03/02/16 1130 03/02/16 1853 03/03/16 0619  BP: 106/63 89/66 101/60 113/78  Pulse: 72 76 88 62  Temp: 97.9 F (36.6 C) 98.5 F (36.9 C) 98 F (36.7 C)   TempSrc: Oral Oral Oral   Resp: 18 18 18 18   Height:      Weight:       General: alert, cooperative and no distress Lochia: appropriate Uterine Fundus: firm Incision: N/A DVT Evaluation: No evidence of DVT seen on physical exam. No significant calf/ankle edema. Labs: Lab Results  Component Value Date   WBC  10.2 03/01/2016   HGB 11.4* 03/01/2016   HCT 33.7* 03/01/2016   MCV 81.6 03/01/2016   PLT 151 03/01/2016   No flowsheet data found.  Discharge instruction: per After Visit Summary and "Baby and Me Booklet".  After visit meds:    Medication List    ASK your doctor about these medications        FLINSTONES GUMMIES OMEGA-3 DHA PO  Take 2 tablets by mouth daily.        Diet: routine diet  Activity: Advance as tolerated. Pelvic rest for 6 weeks.   Outpatient follow up:6 weeks Follow up Appt:Future Appointments Date Time Provider [place]  03/06/2016 11:00 AM Roma Schanz, CNM [facility]   Follow up Visit:No Follow-up on file.  Postpartum contraception: Progesterone only pills  Newborn Data: Live born female  Birth Weight: 6 lb 14.6 oz (3135  g) APGAR: 9, 9  Baby Feeding: Breast Disposition:home with mother   03/03/2016 Ralene Ok, MD   I have seen and examined this patient and agree the above assessment.  Respiratory effort normal, lochia appropriate, legs negative,  pain level normal.  CRESENZO-DISHMAN,Bobbette Eakes 03/03/2016 8:08 AM

## 2016-03-03 NOTE — Telephone Encounter (Signed)
I have already said yes to this message

## 2016-03-06 ENCOUNTER — Encounter: Payer: Medicaid Other | Admitting: Women's Health

## 2016-03-29 ENCOUNTER — Ambulatory Visit (INDEPENDENT_AMBULATORY_CARE_PROVIDER_SITE_OTHER): Payer: Medicaid Other | Admitting: Women's Health

## 2016-03-29 ENCOUNTER — Encounter: Payer: Self-pay | Admitting: Women's Health

## 2016-03-29 DIAGNOSIS — A749 Chlamydial infection, unspecified: Secondary | ICD-10-CM

## 2016-03-29 NOTE — Patient Instructions (Signed)
NO SEX UNTIL AFTER YOU GET YOUR BIRTH CONTROL   Levonorgestrel intrauterine device (IUD) What is this medicine? LEVONORGESTREL IUD (LEE voe nor jes trel) is a contraceptive (birth control) device. The device is placed inside the uterus by a healthcare professional. It is used to prevent pregnancy and can also be used to treat heavy bleeding that occurs during your period. Depending on the device, it can be used for 3 to 5 years. This medicine may be used for other purposes; ask your health care provider or pharmacist if you have questions. What should I tell my health care provider before I take this medicine? They need to know if you have any of these conditions: -abnormal Pap smear -cancer of the breast, uterus, or cervix -diabetes -endometritis -genital or pelvic infection now or in the past -have more than one sexual partner or your partner has more than one partner -heart disease -history of an ectopic or tubal pregnancy -immune system problems -IUD in place -liver disease or tumor -problems with blood clots or take blood-thinners -use intravenous drugs -uterus of unusual shape -vaginal bleeding that has not been explained -an unusual or allergic reaction to levonorgestrel, other hormones, silicone, or polyethylene, medicines, foods, dyes, or preservatives -pregnant or trying to get pregnant -breast-feeding How should I use this medicine? This device is placed inside the uterus by a health care professional. Talk to your pediatrician regarding the use of this medicine in children. Special care may be needed. Overdosage: If you think you have taken too much of this medicine contact a poison control center or emergency room at once. NOTE: This medicine is only for you. Do not share this medicine with others. What if I miss a dose? This does not apply. What may interact with this medicine? Do not take this medicine with any of the following  medications: -amprenavir -bosentan -fosamprenavir This medicine may also interact with the following medications: -aprepitant -barbiturate medicines for inducing sleep or treating seizures -bexarotene -griseofulvin -medicines to treat seizures like carbamazepine, ethotoin, felbamate, oxcarbazepine, phenytoin, topiramate -modafinil -pioglitazone -rifabutin -rifampin -rifapentine -some medicines to treat HIV infection like atazanavir, indinavir, lopinavir, nelfinavir, tipranavir, ritonavir -St. John's wort -warfarin This list may not describe all possible interactions. Give your health care provider a list of all the medicines, herbs, non-prescription drugs, or dietary supplements you use. Also tell them if you smoke, drink alcohol, or use illegal drugs. Some items may interact with your medicine. What should I watch for while using this medicine? Visit your doctor or health care professional for regular check ups. See your doctor if you or your partner has sexual contact with others, becomes HIV positive, or gets a sexual transmitted disease. This product does not protect you against HIV infection (AIDS) or other sexually transmitted diseases. You can check the placement of the IUD yourself by reaching up to the top of your vagina with clean fingers to feel the threads. Do not pull on the threads. It is a good habit to check placement after each menstrual period. Call your doctor right away if you feel more of the IUD than just the threads or if you cannot feel the threads at all. The IUD may come out by itself. You may become pregnant if the device comes out. If you notice that the IUD has come out use a backup birth control method like condoms and call your health care provider. Using tampons will not change the position of the IUD and are okay to use during your   period. What side effects may I notice from receiving this medicine? Side effects that you should report to your doctor or  health care professional as soon as possible: -allergic reactions like skin rash, itching or hives, swelling of the face, lips, or tongue -fever, flu-like symptoms -genital sores -high blood pressure -no menstrual period for 6 weeks during use -pain, swelling, warmth in the leg -pelvic pain or tenderness -severe or sudden headache -signs of pregnancy -stomach cramping -sudden shortness of breath -trouble with balance, talking, or walking -unusual vaginal bleeding, discharge -yellowing of the eyes or skin Side effects that usually do not require medical attention (report to your doctor or health care professional if they continue or are bothersome): -acne -breast pain -change in sex drive or performance -changes in weight -cramping, dizziness, or faintness while the device is being inserted -headache -irregular menstrual bleeding within first 3 to 6 months of use -nausea This list may not describe all possible side effects. Call your doctor for medical advice about side effects. You may report side effects to FDA at 1-800-FDA-1088. Where should I keep my medicine? This does not apply. NOTE: This sheet is a summary. It may not cover all possible information. If you have questions about this medicine, talk to your doctor, pharmacist, or health care provider.    2016, Elsevier/Gold Standard. (2011-08-31 13:54:04)  

## 2016-03-29 NOTE — Progress Notes (Signed)
Subjective:    Joan Andrews is a 20 y.o. G73P1011 African American female who presents for a postpartum visit. She is 4 weeks postpartum following a spontaneous vaginal delivery at 38.6 gestational weeks. Anesthesia: local for lac repair. I have fully reviewed the prenatal and intrapartum course. Postpartum course has been uncomplicated. Baby's course has been uncomplicated. Baby is feeding by breast x 3wks, now bottle. Bleeding no bleeding. Bowel function is normal. Bladder function is normal. Patient is not sexually active. Last sexual activity: prior to birth of baby. Contraception method is wants IUD. Postpartum depression screening: negative. Score 3.  Last pap 07/27/15 and was neg. +CT tx February 16, 2016, will do poc today.   The following portions of the patient's history were reviewed and updated as appropriate: allergies, current medications, past medical history, past surgical history and problem list.  Review of Systems Pertinent items are noted in HPI.   Vitals:   03/29/16 1133  BP: 106/80  Pulse: 72  Weight: 191 lb 6.4 oz (86.8 kg)  Height: 5\' 4"  (1.626 m)   No LMP recorded.  Objective:   General:  alert, cooperative and no distress   Breasts:  deferred, no complaints  Lungs: clear to auscultation bilaterally  Heart:  regular rate and rhythm  Abdomen: soft, nontender   Vulva: 2nd degree perineal lac repair well healed but feels tight towards introitus and did open back up slightly w/ bimanual exam, ~1-1.5cm fleshy/but healed area exposed on distal end of repair of perineum. MD not available in building for co-exam  Vagina: normal vagina  Cervix:  closed  Corpus: Well-involuted  Adnexa:  Non-palpable  Rectal Exam: no hemorrhoids        Assessment:   Postpartum exam 4 wks s/p SVB Defect in perineal repair Bottlefeeding Depression screening Contraception counseling  Recent CT+  Plan:   Contraception: abstinence until IUD insertion Follow up in: this week  for IUD insertion w/ JVF so he can also assess perineal repair/defect  CT POC today  Tawnya Crook CNM, [facility] 03/29/2016 11:46 AM

## 2016-03-30 ENCOUNTER — Ambulatory Visit: Payer: Medicaid Other | Admitting: Obstetrics and Gynecology

## 2016-03-30 LAB — GC/CHLAMYDIA PROBE AMP
CHLAMYDIA, DNA PROBE: NEGATIVE
NEISSERIA GONORRHOEAE BY PCR: NEGATIVE

## 2016-04-05 ENCOUNTER — Ambulatory Visit (INDEPENDENT_AMBULATORY_CARE_PROVIDER_SITE_OTHER): Payer: Medicaid Other | Admitting: Obstetrics and Gynecology

## 2016-04-05 ENCOUNTER — Encounter: Payer: Self-pay | Admitting: Obstetrics and Gynecology

## 2016-04-05 VITALS — BP 128/80 | HR 84 | Ht 64.0 in | Wt 192.4 lb

## 2016-04-05 DIAGNOSIS — Z30430 Encounter for insertion of intrauterine contraceptive device: Secondary | ICD-10-CM | POA: Diagnosis not present

## 2016-04-05 DIAGNOSIS — Z30432 Encounter for removal of intrauterine contraceptive device: Secondary | ICD-10-CM

## 2016-04-05 DIAGNOSIS — Z30014 Encounter for initial prescription of intrauterine contraceptive device: Secondary | ICD-10-CM

## 2016-04-05 DIAGNOSIS — Z975 Presence of (intrauterine) contraceptive device: Secondary | ICD-10-CM | POA: Insufficient documentation

## 2016-04-05 DIAGNOSIS — Z3042 Encounter for surveillance of injectable contraceptive: Secondary | ICD-10-CM

## 2016-04-05 LAB — POCT URINE PREGNANCY: PREG TEST UR: NEGATIVE

## 2016-04-05 MED ORDER — LEVONORGESTREL 20 MCG/24HR IU IUD
INTRAUTERINE_SYSTEM | Freq: Once | INTRAUTERINE | Status: DC
Start: 2016-04-05 — End: 2017-03-27

## 2016-04-05 NOTE — Progress Notes (Signed)
Patient ID: [name], female   DOB: Nov 20, 1995, 20 y.o.   MRN: [id number]  [facility] CLINIC PROCEDURE NOTE- IUD INSERT  Joan Andrews is a 20 y.o. G2P1011 here for [phone]) Mirena IUD insertion  No GYN concerns.   Last pap smear was on 07/27/15 and was normal.  GC/CHL negative on 03/29/16   Pt states she has not been sexually active since delivery one month ago. She states she has been having intermittent vaginal bleeding since delivery. She denies vaginal pain. She breast fed for 3 weeks and is currently bottle feeding.   IUD Insertion Procedure Note Patient identified, informed consent performed, consent signed.   Discussed risks of irregular bleeding, cramping, infection, malpositioning or misplacement of the IUD outside the uterus which may require further procedure such as laparoscopy. Time out was performed.  Urine pregnancy test negative.  Speculum inserted, Cervix visualized. Removed a tiny fragment of membrane from the external os. No additional fragments identified.  Cleaned with Betadine x 2.   Grasped anteriorly with a single tooth tenaculum.  Uterus sounded to 9 cm.  Mirena IUD placed per manufacturer's recommendations.   Strings trimmed to 3 cm.  Tenaculum was removed, good hemostasis noted.  Patient tolerated procedure well.   Patient was given post-procedure instructions.  She was advised to have backup contraception for one week.  Patient was also asked to check IUD strings periodically and follow up in 4 weeks for IUD check.    By signing my name below, I, Hansel Feinstein, attest that this documentation has been prepared under the direction and in the presence of Jonnie Kind, MD. Electronically Signed: Hansel Feinstein, ED Scribe. 04/05/16. 2:07 PM.  I personally performed the services described in this documentation, which was SCRIBED in my presence. The recorded information has been reviewed and considered accurate. It has been edited as necessary  during review. Jonnie Kind, MD

## 2016-04-14 NOTE — Telephone Encounter (Signed)
Patient had baby has already been seen

## 2016-05-08 ENCOUNTER — Ambulatory Visit (INDEPENDENT_AMBULATORY_CARE_PROVIDER_SITE_OTHER): Payer: Medicaid Other | Admitting: Obstetrics and Gynecology

## 2016-05-08 ENCOUNTER — Encounter: Payer: Self-pay | Admitting: Obstetrics and Gynecology

## 2016-05-08 VITALS — BP 104/62 | HR 70 | Ht 64.0 in | Wt 194.8 lb

## 2016-05-08 DIAGNOSIS — Z975 Presence of (intrauterine) contraceptive device: Secondary | ICD-10-CM

## 2016-05-08 DIAGNOSIS — Z30431 Encounter for routine checking of intrauterine contraceptive device: Secondary | ICD-10-CM

## 2016-05-08 DIAGNOSIS — N938 Other specified abnormal uterine and vaginal bleeding: Secondary | ICD-10-CM | POA: Diagnosis not present

## 2016-05-08 MED ORDER — MEGESTROL ACETATE 40 MG PO TABS
40.0000 mg | ORAL_TABLET | Freq: Three times a day (TID) | ORAL | 2 refills | Status: DC
Start: 2016-05-08 — End: 2017-02-08

## 2016-05-08 NOTE — Progress Notes (Signed)
[facility] CLINIC PROCEDURE NOTE  Joan Andrews is a 20 y.o. female presents to have IUD placement checked and complains of vaginal bleeding since insertion.   IUD Recheck  Patient was in the dorsal lithotomy position, normal external genitalia was noted.  A speculum was placed in the patient's vagina, normal discharge was noted, no lesions. The multiparous cervix was visualized, no lesions, no abnormal discharge. The strings of the IUD were visualized and placement was confirmed with bedside US. Which revealed an IUD located properly in an anteflexed uterus, not tender. Blood moderate during exam. Patient tolerated the procedure well.     By signing my name below, I, Sonum Patel, attest that this documentation has been prepared under the direction and in the presence of Jonnie Kind, MD. Electronically Signed: Sonum Patel, Education administrator. 05/08/16. 2:58 PM.  I personally performed the services described in this documentation, which was SCRIBED in my presence. The recorded information has been reviewed and considered accurate. It has been edited as necessary during review. Jonnie Kind, MD

## 2016-05-22 ENCOUNTER — Telehealth: Payer: Self-pay | Admitting: Obstetrics and Gynecology

## 2016-05-22 NOTE — Telephone Encounter (Signed)
Pt states she is still having vaginal bleeding with Megace. Pt informed to continue the Megace as prescribed may take time to see the full affect. Pt verbalized understanding.

## 2016-06-13 ENCOUNTER — Ambulatory Visit: Payer: Self-pay | Admitting: Adult Health

## 2016-06-19 ENCOUNTER — Ambulatory Visit: Payer: Self-pay | Admitting: Adult Health

## 2016-07-25 ENCOUNTER — Emergency Department (HOSPITAL_COMMUNITY)
Admission: EM | Admit: 2016-07-25 | Discharge: 2016-07-25 | Disposition: A | Discharge status: Home / Self Care | Payer: PRIVATE HEALTH INSURANCE | Attending: Emergency Medicine | Admitting: Emergency Medicine

## 2016-07-25 ENCOUNTER — Encounter (HOSPITAL_COMMUNITY): Payer: Self-pay | Admitting: Emergency Medicine

## 2016-07-25 DIAGNOSIS — J039 Acute tonsillitis, unspecified: Secondary | ICD-10-CM | POA: Insufficient documentation

## 2016-07-25 DIAGNOSIS — Z79899 Other long term (current) drug therapy: Secondary | ICD-10-CM | POA: Insufficient documentation

## 2016-07-25 MED ORDER — AMOXICILLIN 500 MG PO CAPS: 500.0000 mg | ORAL_CAPSULE | Freq: Three times a day (TID) | ORAL | 0 refills | Status: DC

## 2016-07-25 MED ORDER — MAGIC MOUTHWASH W/LIDOCAINE: 5.0000 mL | Freq: Three times a day (TID) | ORAL | 0 refills | Status: DC | PRN

## 2016-07-25 NOTE — Discharge Instructions (Signed)
Take tylenol or ibuprofen every 4-6 hrs as needed for fever or pain.  Follow-up with your doctor for recheck

## 2016-07-25 NOTE — ED Triage Notes (Signed)
Pt reports sore throat x 2 weeks. Pt states it has become very painful in the past couple of days.

## 2016-07-29 NOTE — ED Provider Notes (Signed)
[place] DEPT Provider Note   CSN: [id number] Arrival date & time: 07/25/16  1317     History   Chief Complaint Chief Complaint  Patient presents with  . Sore Throat    HPI Joan Andrews is a 20 y.o. female.  HPI   Joan Andrews is a 20 y.o. female who presents to the Emergency Department complaining of sore throat for 2 weeks.  States the pain has worsened for several days.  Pain is worse with swallowing.  She states she had "cold" symptoms prior to onset of her throat pain.  She denies known fever, rash, abd pain or neck pain.   Past Medical History:  Diagnosis Date  . Bronchitis    as a child only  . Contraceptive management 12/26/2013  . Miscarriage   . Pregnant 07/14/2014  . Recurrent tonsillitis   . Supervision of normal pregnancy in first trimester 07/27/2015    [place] Initiated Care at   7+5 weeks FOB  Stephanie Acre 23 yo bm Dating By  LMP and [place] Pap  07/27/15 GC/CT Initial:                36+wks: Genetic Screen NT/IT:  CF screen  Anatomic [place]  Flu vaccine  Tdap Recommended ~ 28wks Glucose Screen  2 hr GBS  Feed Preference  Contraception  Circumcision  Childbirth Classes  Pediatrician      Patient Active Problem List   Diagnosis Date Noted  . Encounter for insertion of mirena IUD 04/05/2016  . Chlamydia infection affecting pregnancy in third trimester, antepartum 02/16/2016    Past Surgical History:  Procedure Laterality Date  . HERNIA REPAIR      OB History    Gravida Para Term Preterm AB Living   3 1 1   1 1    SAB TAB Ectopic Multiple Live Births   1     0 1       Home Medications    Prior to Admission medications   Medication Sig Start Date End Date Taking? Authorizing Provider  amoxicillin (AMOXIL) 500 MG capsule Take 1 capsule (500 mg total) by mouth 3 (three) times daily. 07/25/16   Shankar Silber, PA-C  magic mouthwash w/lidocaine SOLN Take 5 mLs by mouth 3 (three) times daily as needed for mouth pain. Swish and  spit, do not swallow 07/25/16   Braydan Marriott, PA-C  megestrol (MEGACE) 40 MG tablet Take 1 tablet (40 mg total) by mouth 3 (three) times daily. Until bleeding stops then discontinue Megace 05/08/16   Jonnie Kind, MD    Family History Family History  Problem Relation Age of Onset  . Hypertension Father   . Alzheimer's disease Maternal Grandmother   . Hypertension Mother     Social History Social History  Substance Use Topics  . Smoking status: Never Smoker  . Smokeless tobacco: Never Used  . Alcohol use No     Allergies   Patient has no known allergies.   Review of Systems Review of Systems  Constitutional: Negative for activity change, appetite change, chills and fever.  HENT: Positive for congestion and sore throat. Negative for ear pain, facial swelling, trouble swallowing and voice change.   Eyes: Negative for pain and visual disturbance.  Respiratory: Negative for cough and shortness of breath.   Gastrointestinal: Negative for abdominal pain, nausea and vomiting.  Musculoskeletal: Negative for arthralgias, neck pain and neck stiffness.  Skin: Negative for color change and rash.  Neurological: Negative for  dizziness, facial asymmetry, speech difficulty, numbness and headaches.  Hematological: Negative for adenopathy.  All other systems reviewed and are negative.    Physical Exam Updated Vital Signs BP 105/67 (BP Location: Left Arm)   Pulse 68   Temp 97.7 F (36.5 C) (Oral)   Resp 16   Ht 5\' 4"  (1.626 m)   Wt 81.6 kg   LMP 07/08/2016   SpO2 100%   Breastfeeding? Unknown   BMI 30.90 kg/m   Physical Exam  Constitutional: She is oriented to person, place, and time. She appears well-developed and well-nourished. No distress.  HENT:  Head: Normocephalic and atraumatic.  Right Ear: Tympanic membrane and ear canal normal.  Left Ear: Tympanic membrane and ear canal normal.  Mouth/Throat: Uvula is midline and mucous membranes are normal. No trismus in the  jaw. No uvula swelling. Posterior oropharyngeal edema and posterior oropharyngeal erythema present. No tonsillar abscesses.  Neck: Normal range of motion. Neck supple.  Cardiovascular: Normal rate, regular rhythm and normal heart sounds.   Pulmonary/Chest: Effort normal and breath sounds normal.  Abdominal: There is no splenomegaly. There is no tenderness.  Musculoskeletal: Normal range of motion.  Lymphadenopathy:    She has no cervical adenopathy.  Neurological: She is alert and oriented to person, place, and time. She exhibits normal muscle tone. Coordination normal.  Skin: Skin is warm and dry.  Nursing note and vitals reviewed.    ED Treatments / Results  Labs (all labs ordered are listed, but only abnormal results are displayed) Labs Reviewed - No data to display  EKG  EKG Interpretation None       Radiology No results found.  Procedures Procedures (including critical care time)  Medications Ordered in ED Medications - No data to display   Initial Impression / Assessment and Plan / ED Course  I have reviewed the triage vital signs and the nursing notes.  Pertinent labs & imaging results that were available during my care of the patient were reviewed by me and considered in my medical decision making (see chart for details).  Clinical Course     Airway patent.  Vitals stable.  Pt handles own secretions well.  No evidence of PTA.  Agrees to tx plan and PMD f/u if needed.   Final Clinical Impressions(s) / ED Diagnoses   Final diagnoses:  Tonsillitis    New Prescriptions Discharge Medication List as of 07/25/2016  3:03 PM    START taking these medications   Details  amoxicillin (AMOXIL) 500 MG capsule Take 1 capsule (500 mg total) by mouth 3 (three) times daily., Starting Tue 07/25/2016, Print    magic mouthwash w/lidocaine SOLN Take 5 mLs by mouth 3 (three) times daily as needed for mouth pain. Swish and spit, do not swallow, Starting Tue 07/25/2016,  Print         Meribeth Vitug Alta, PA-C 07/29/16 [place], MD 07/29/16 1955

## 2017-02-08 ENCOUNTER — Other Ambulatory Visit (HOSPITAL_COMMUNITY)
Admission: RE | Admit: 2017-02-08 | Discharge: 2017-02-08 | Disposition: A | Discharge status: Home / Self Care | Payer: BLUE CROSS/BLUE SHIELD | Source: Ambulatory Visit | Attending: Adult Health | Admitting: Adult Health

## 2017-02-08 ENCOUNTER — Ambulatory Visit (INDEPENDENT_AMBULATORY_CARE_PROVIDER_SITE_OTHER): Payer: BLUE CROSS/BLUE SHIELD | Admitting: Adult Health

## 2017-02-08 ENCOUNTER — Encounter: Payer: Self-pay | Admitting: Adult Health

## 2017-02-08 VITALS — BP 100/70 | HR 76 | Ht 64.0 in | Wt 178.0 lb

## 2017-02-08 DIAGNOSIS — Z975 Presence of (intrauterine) contraceptive device: Secondary | ICD-10-CM

## 2017-02-08 DIAGNOSIS — Z01419 Encounter for gynecological examination (general) (routine) without abnormal findings: Secondary | ICD-10-CM

## 2017-02-08 DIAGNOSIS — B379 Candidiasis, unspecified: Secondary | ICD-10-CM | POA: Diagnosis not present

## 2017-02-08 DIAGNOSIS — Z30432 Encounter for removal of intrauterine contraceptive device: Secondary | ICD-10-CM | POA: Insufficient documentation

## 2017-02-08 NOTE — Progress Notes (Signed)
Patient ID: Joan Andrews, female   DOB: [birth year], 21 y.o.   MRN: [id number] History of Present Illness: Joan Andrews is a 21 year old black female in for a well woman gyn exam and pap. PCP is Dr Buelah Manis.    Current Medications, Allergies, Past Medical History, Past Surgical History, Family History and Social History were reviewed in [place] record.     Review of Systems: Patient denies any headaches, hearing loss, fatigue, blurred vision, shortness of breath, chest pain, abdominal pain, problems with bowel movements, urination, or intercourse. No joint pain or mood swings.Has discomfort with IUD at times, wants it removed in near future.    Physical Exam:BP 100/70 (BP Location: Left Arm, Patient Position: Sitting, Cuff Size: Small)   Pulse 76   Ht 5\' 4"  (1.626 m)   Wt 178 lb (80.7 kg)   LMP 01/23/2017   BMI 30.55 kg/m  General:  Well developed, well nourished, no acute distress Skin:  Warm and dry Neck:  Midline trachea, normal thyroid, good ROM, no lymphadenopathy Lungs; Clear to auscultation bilaterally Breast:  No dominant palpable mass, retraction, or nipple discharge Cardiovascular: Regular rate and rhythm Abdomen:  Soft, non tender, no hepatosplenomegaly Pelvic:  External genitalia is normal in appearance, no lesions.  The vagina is normal in appearance. Urethra has no lesions or masses. The cervix is bulbous.+IUD strings at os, pap with GC/chlamdyia performed.  Uterus is felt to be normal size, shape, and contour.  No adnexal masses or tenderness noted.Bladder is non tender, no masses felt. Extremities/musculoskeletal:  No swelling or varicosities noted, no clubbing or cyanosis Psych:  No mood changes, alert and cooperative,seems happy PHQ 2 score 0.Will bring back for IUD removal in about 3 weeks, she thinks she wants to try the patch then.   Impression: 1. Encounter for routine gynecological examination with Papanicolaou smear of cervix   2.  IUD (intrauterine device) in place       Plan: Return in 3 weeks for IUD removal Physical in 1 year Pap in 3 if normal

## 2017-02-08 NOTE — Addendum Note (Signed)
Addended by: Diona Fanti A on: 02/08/2017 02:36 PM   Modules accepted: Orders

## 2017-02-13 LAB — CYTOLOGY - PAP
Chlamydia: NEGATIVE
DIAGNOSIS: NEGATIVE
HPV (WINDOPATH): NOT DETECTED
NEISSERIA GONORRHEA: NEGATIVE

## 2017-03-01 ENCOUNTER — Ambulatory Visit (INDEPENDENT_AMBULATORY_CARE_PROVIDER_SITE_OTHER): Payer: BLUE CROSS/BLUE SHIELD | Admitting: Adult Health

## 2017-03-01 ENCOUNTER — Encounter: Payer: Self-pay | Admitting: Adult Health

## 2017-03-01 VITALS — BP 100/60 | HR 80 | Ht 64.0 in | Wt 181.0 lb

## 2017-03-01 DIAGNOSIS — Z30016 Encounter for initial prescription of transdermal patch hormonal contraceptive device: Secondary | ICD-10-CM

## 2017-03-01 DIAGNOSIS — Z30432 Encounter for removal of intrauterine contraceptive device: Secondary | ICD-10-CM

## 2017-03-01 DIAGNOSIS — Z3202 Encounter for pregnancy test, result negative: Secondary | ICD-10-CM

## 2017-03-01 LAB — POCT URINE PREGNANCY: Preg Test, Ur: NEGATIVE

## 2017-03-01 MED ORDER — NORELGESTROMIN-ETH ESTRADIOL 150-35 MCG/24HR TD PTWK: 1.0000 | MEDICATED_PATCH | TRANSDERMAL | 12 refills | Status: DC

## 2017-03-01 NOTE — Addendum Note (Signed)
Addended by: Diona Fanti A on: 03/01/2017 11:33 AM   Modules accepted: Orders

## 2017-03-01 NOTE — Progress Notes (Signed)
Subjective:     Patient ID: Joan Andrews, female   DOB: January 14, 1996, 21 y.o.   MRN: [id number]  HPI [name] is a 21 year old black female in for IUD removal. And she wants the patch.   Review of Systems For IUD removal Reviewed past medical,surgical, social and family history. Reviewed medications and allergies.     Objective:   Physical Exam BP 100/60 (BP Location: Left Arm, Patient Position: Sitting, Cuff Size: Small)   Pulse 80   Ht 5\' 4"  (1.626 m)   Wt 181 lb (82.1 kg)   LMP  (LMP Unknown)   BMI 31.07 kg/m UPT negative,consent signed, speculum inserted in vagina and cervix visualized, strings at os, grasped with forcepts pt asked to cough and IUD easily removed. Pt requests patch has used in the past.Can put on first patch today.     Assessment:     1. Encounter for IUD removal   2. Encounter for initial prescription of transdermal patch hormonal contraceptive device       Plan:     Meds ordered this encounter  Medications  . norelgestromin-ethinyl estradiol (ORTHO EVRA) 150-35 MCG/24HR transdermal patch    Sig: Place 1 patch onto the skin once a week.    Dispense:  3 patch    Refill:  12    Order Specific Question:   Supervising Provider    Answer:   Tania Ade H [[id number]]  U se condoms for at least 1 month Follow up prn

## 2017-03-20 ENCOUNTER — Encounter (HOSPITAL_COMMUNITY): Payer: Self-pay

## 2017-03-20 ENCOUNTER — Emergency Department (HOSPITAL_COMMUNITY)
Admission: EM | Admit: 2017-03-20 | Discharge: 2017-03-20 | Disposition: A | Discharge status: Home Health | Payer: BLUE CROSS/BLUE SHIELD | Attending: Emergency Medicine | Admitting: Emergency Medicine

## 2017-03-20 DIAGNOSIS — Z79899 Other long term (current) drug therapy: Secondary | ICD-10-CM | POA: Insufficient documentation

## 2017-03-20 DIAGNOSIS — R51 Headache: Secondary | ICD-10-CM | POA: Insufficient documentation

## 2017-03-20 DIAGNOSIS — R110 Nausea: Secondary | ICD-10-CM | POA: Diagnosis not present

## 2017-03-20 DIAGNOSIS — R519 Headache, unspecified: Secondary | ICD-10-CM

## 2017-03-20 MED ORDER — ONDANSETRON 4 MG PO TBDP
4.0000 mg | ORAL_TABLET | Freq: Once | ORAL | Status: AC
  Administered 2017-03-20: 4 mg via ORAL
  Filled 2017-03-20: qty 1

## 2017-03-20 MED ORDER — SODIUM CHLORIDE 0.9 % IV SOLN
Freq: Once | INTRAVENOUS | Status: AC
  Administered 2017-03-20: 10:00:00 via INTRAVENOUS

## 2017-03-20 MED ORDER — ONDANSETRON 4 MG PO TBDP
ORAL_TABLET | ORAL | 0 refills | Status: DC
Start: 2017-03-20 — End: 2017-03-27

## 2017-03-20 MED ORDER — IBUPROFEN 800 MG PO TABS: 800.0000 mg | ORAL_TABLET | Freq: Three times a day (TID) | ORAL | 0 refills | Status: DC | PRN

## 2017-03-20 MED ORDER — IBUPROFEN 800 MG PO TABS
800.0000 mg | ORAL_TABLET | Freq: Once | ORAL | Status: AC
  Administered 2017-03-20: 800 mg via ORAL
  Filled 2017-03-20: qty 1

## 2017-03-20 NOTE — ED Provider Notes (Signed)
[facility] DEPT Provider Note   CSN: [id number] Arrival date & time: 03/20/17  0810     History   Chief Complaint Chief Complaint  Patient presents with  . Headache    HPI Joan Andrews is a 21 y.o. female.  The patient states that she hit her head on a cabinet and fell before but did not pass out. She's been having dizziness and headaches since   The history is provided by the patient.  Headache   This is a new problem. The current episode started yesterday. The problem occurs constantly. The problem has not changed since onset.The headache is associated with nothing. The pain is located in the bilateral region. The quality of the pain is described as dull. The pain is at a severity of 7/10. The pain is moderate. The pain does not radiate. Associated symptoms include nausea.    Past Medical History:  Diagnosis Date  . Bronchitis    as a child only  . Contraceptive management 12/26/2013  . Miscarriage   . Pregnant 07/14/2014  . Recurrent tonsillitis   . Supervision of normal pregnancy in first trimester 07/27/2015    [place] Initiated Care at   7+5 weeks FOB  Stephanie Acre 23 yo bm Dating By  LMP and [place] Pap  07/27/15 GC/CT Initial:                36+wks: Genetic Screen NT/IT:  CF screen  Anatomic [place]  Flu vaccine  Tdap Recommended ~ 28wks Glucose Screen  2 hr GBS  Feed Preference  Contraception  Circumcision  Childbirth Classes  Pediatrician      Patient Active Problem List   Diagnosis Date Noted  . Encounter for initial prescription of transdermal patch hormonal contraceptive device 03/01/2017  . Encounter for IUD removal 02/08/2017  . Chlamydia infection affecting pregnancy in third trimester, antepartum 02/16/2016    Past Surgical History:  Procedure Laterality Date  . HERNIA REPAIR      OB History    Gravida Para Term Preterm AB Living   3 1 1   1 1    SAB TAB Ectopic Multiple Live Births   1     0 1       Home Medications    Prior to  Admission medications   Medication Sig Start Date End Date Taking? Authorizing Provider  ibuprofen (ADVIL,MOTRIN) 800 MG tablet Take 1 tablet (800 mg total) by mouth every 8 (eight) hours as needed for headache. 03/20/17   Milton Ferguson, MD  norelgestromin-ethinyl estradiol (ORTHO EVRA) 150-35 MCG/24HR transdermal patch Place 1 patch onto the skin once a week. 03/01/17   Estill Dooms, NP  ondansetron (ZOFRAN ODT) 4 MG disintegrating tablet 4mg  ODT q4 hours prn nausea/vomit 03/20/17   Milton Ferguson, MD    Family History Family History  Problem Relation Age of Onset  . Hypertension Father   . Alzheimer's disease Maternal Grandmother   . Hypertension Mother     Social History Social History  Substance Use Topics  . Smoking status: Never Smoker  . Smokeless tobacco: Never Used  . Alcohol use No     Allergies   Patient has no known allergies.   Review of Systems Review of Systems  Constitutional: Negative for appetite change and fatigue.  HENT: Negative for congestion, ear discharge and sinus pressure.   Eyes: Negative for discharge.  Respiratory: Negative for cough.   Cardiovascular: Negative for chest pain.  Gastrointestinal: Positive for nausea. Negative for  abdominal pain and diarrhea.  Genitourinary: Negative for frequency and hematuria.  Musculoskeletal: Negative for back pain.  Skin: Negative for rash.  Neurological: Positive for headaches. Negative for seizures.  Psychiatric/Behavioral: Negative for hallucinations.     Physical Exam Updated Vital Signs BP 101/69 (BP Location: Right Arm)   Pulse (!) 118   Temp 98.7 F (37.1 C) (Oral)   Resp 20   Ht 5\' 4"  (1.626 m)   Wt 82.1 kg (181 lb)   LMP 03/02/2017   SpO2 100%   Breastfeeding? No   BMI 31.07 kg/m   Physical Exam  Constitutional: She is oriented to person, place, and time. She appears well-developed.  HENT:  Head: Normocephalic.  Mild tenderness to top of head  Eyes: Conjunctivae and EOM are  normal. No scleral icterus.  Neck: Neck supple. No thyromegaly present.  Cardiovascular: Normal rate and regular rhythm.  Exam reveals no gallop and no friction rub.   No murmur heard. Pulmonary/Chest: No stridor. She has no wheezes. She has no rales. She exhibits no tenderness.  Abdominal: She exhibits no distension. There is no tenderness. There is no rebound.  Musculoskeletal: Normal range of motion. She exhibits no edema.  Lymphadenopathy:    She has no cervical adenopathy.  Neurological: She is oriented to person, place, and time. She exhibits normal muscle tone. Coordination normal.  Skin: No rash noted. No erythema.  Psychiatric: She has a normal mood and affect. Her behavior is normal.     ED Treatments / Results  Labs (all labs ordered are listed, but only abnormal results are displayed) Labs Reviewed - No data to display  EKG  EKG Interpretation None       Radiology No results found.  Procedures Procedures (including critical care time)  Medications Ordered in ED Medications  ibuprofen (ADVIL,MOTRIN) tablet 800 mg (800 mg Oral Given 03/20/17 0845)  ondansetron (ZOFRAN-ODT) disintegrating tablet 4 mg (4 mg Oral Given 03/20/17 0845)     Initial Impression / Assessment and Plan / ED Course  I have reviewed the triage vital signs and the nursing notes.  Pertinent labs & imaging results that were available during my care of the patient were reviewed by me and considered in my medical decision making (see chart for details).     Patient with a headache from mild head trauma. Patient given Motrin and Zofran will follow-up as needed  Final Clinical Impressions(s) / ED Diagnoses   Final diagnoses:  Headache around the eyes    New Prescriptions New Prescriptions   IBUPROFEN (ADVIL,MOTRIN) 800 MG TABLET    Take 1 tablet (800 mg total) by mouth every 8 (eight) hours as needed for headache.   ONDANSETRON (ZOFRAN ODT) 4 MG DISINTEGRATING TABLET    4mg  ODT q4 hours  prn nausea/vomit     Milton Ferguson, MD 03/20/17 [phone]

## 2017-03-20 NOTE — Discharge Instructions (Signed)
Follow up with your md if not improving. °

## 2017-03-20 NOTE — ED Notes (Signed)
Pt's orthostatic vitals reported to Dr. Roderic Palau.  IV fluids ordered.  Pt denies feeling dizzy or lightheaded at this time.

## 2017-03-20 NOTE — ED Triage Notes (Signed)
Pt reports she hit her head on her cabinet at home and fell to the floor.  Denies any loss of consciousness.  Pt reports headache and dizziness since then.

## 2017-03-20 NOTE — ED Notes (Signed)
EDP verbal order of pt discharge.

## 2017-03-27 ENCOUNTER — Encounter: Payer: Self-pay | Admitting: Family Medicine

## 2017-03-27 ENCOUNTER — Ambulatory Visit (INDEPENDENT_AMBULATORY_CARE_PROVIDER_SITE_OTHER): Payer: BLUE CROSS/BLUE SHIELD | Admitting: Family Medicine

## 2017-03-27 VITALS — BP 106/70 | HR 68 | Temp 98.0°F | Resp 18 | Wt 177.0 lb

## 2017-03-27 DIAGNOSIS — I9589 Other hypotension: Secondary | ICD-10-CM

## 2017-03-27 NOTE — Progress Notes (Signed)
   Subjective:    Patient ID: Joan Andrews, female    DOB: 1996-03-06, 21 y.o.   MRN: [id number]  Patient presents for Hypotension (says BP is low, has been to ER, told dehydrated)  Patient here to follow-up from the ER. She hit her head on a cabinet had some headache and dizziness therefore she went to the emergency room. She was then found to have some mild hypotension that she does run low normal blood pressure in general. Her blood pressure was down to she states 70 over 50s I did see his systolic down into the 68L. She was given 2 L of fluid. She did not have a blood work done. Her blood pressure was last recorded at 101/60' 6 she was sent home. She has not had any headache or dizzy spell since then but states that she has been checking her blood pressure and this morning was 96/58 therefore she came in to have it evaluated. She's not had any syncopal episodes, no fatigue, no chest pain no shortness of breath. She does not have heavy menstrual cycles. She is no longer on birth control that she was seen by her GYN recently. She is currently on her menstrual cycle.   Review Of Systems:  GEN- denies fatigue, fever, weight loss,weakness, recent illness HEENT- denies eye drainage, change in vision, nasal discharge, CVS- denies chest pain, palpitations RESP- denies SOB, cough, wheeze ABD- denies N/V, change in stools, abd pain GU- denies dysuria, hematuria, dribbling, incontinence MSK- denies joint pain, muscle aches, injury Neuro- denies headache, dizziness, syncope, seizure activity       Objective:    BP 106/70 (BP Location: Left Arm, Patient Position: Sitting, Cuff Size: Normal)   Pulse 68   Temp 98 F (36.7 C) (Oral)   Resp 18   Wt 177 lb (80.3 kg)   LMP 03/25/2017 (Exact Date)   BMI 30.38 kg/m  GEN- NAD, alert and oriented x3 HEENT- PERRL, EOMI, non injected sclera, pink conjunctiva, MMM, oropharynx clear Neck- Supple, no thyromegaly CVS- RRR, no  murmur RESP-CTAB Pulses- Radial  2+        Assessment & Plan:      Problem List Items Addressed This Visit    None    Visit Diagnoses    Other specified hypotension    -  Primary   Mild hypotension though bP today AT BASELINE. Check for anemia, check TSH, check metabolic panel. She is hydrated no symptoms at this time. Advised she can liberalize some salt in her diet    Relevant Orders   CBC with Differential/Platelet   Comprehensive metabolic panel   TSH      Note: This dictation was prepared with Dragon dictation along with smaller phrase technology. Any transcriptional errors that result from this process are unintentional.

## 2017-03-27 NOTE — Patient Instructions (Signed)
F/U pending results   

## 2017-03-28 LAB — COMPREHENSIVE METABOLIC PANEL
ALBUMIN: 3.4 g/dL — AB (ref 3.6–5.1)
ALK PHOS: 50 U/L (ref 33–115)
ALT: 9 U/L (ref 6–29)
AST: 10 U/L (ref 10–30)
BILIRUBIN TOTAL: 0.2 mg/dL (ref 0.2–1.2)
BUN: 17 mg/dL (ref 7–25)
CHLORIDE: 108 mmol/L (ref 98–110)
CO2: 26 mmol/L (ref 20–32)
CREATININE: 1.06 mg/dL (ref 0.50–1.10)
Calcium: 8.3 mg/dL — ABNORMAL LOW (ref 8.6–10.2)
Glucose, Bld: 63 mg/dL — ABNORMAL LOW (ref 70–99)
Potassium: 4 mmol/L (ref 3.5–5.3)
SODIUM: 143 mmol/L (ref 135–146)
TOTAL PROTEIN: 6 g/dL — AB (ref 6.1–8.1)

## 2017-03-28 LAB — CBC WITH DIFFERENTIAL/PLATELET
BASOS ABS: 66 {cells}/uL (ref 0–200)
BASOS PCT: 1 %
EOS PCT: 1 %
Eosinophils Absolute: 66 cells/uL (ref 15–500)
HCT: 35 % (ref 35.0–45.0)
HEMOGLOBIN: 11.2 g/dL — AB (ref 12.0–15.0)
LYMPHS ABS: 2442 {cells}/uL (ref 850–3900)
Lymphocytes Relative: 37 %
MCH: 26.7 pg — ABNORMAL LOW (ref 27.0–33.0)
MCHC: 32 g/dL (ref 32.0–36.0)
MCV: 83.5 fL (ref 80.0–100.0)
MPV: 9 fL (ref 7.5–12.5)
Monocytes Absolute: 726 cells/uL (ref 200–950)
Monocytes Relative: 11 %
NEUTROS ABS: 3300 {cells}/uL (ref 1500–7800)
Neutrophils Relative %: 50 %
Platelets: 256 10*3/uL (ref 140–400)
RBC: 4.19 MIL/uL (ref 3.80–5.10)
RDW: 14.4 % (ref 11.0–15.0)
WBC: 6.6 10*3/uL (ref 3.8–10.8)

## 2017-03-28 LAB — TSH: TSH: 1.53 m[IU]/L

## 2017-05-12 ENCOUNTER — Emergency Department (HOSPITAL_COMMUNITY)
Admission: EM | Admit: 2017-05-12 | Discharge: 2017-05-12 | Disposition: A | Discharge status: Home / Self Care | Payer: BLUE CROSS/BLUE SHIELD

## 2017-05-15 ENCOUNTER — Encounter: Payer: Self-pay | Admitting: Family Medicine

## 2017-05-15 ENCOUNTER — Ambulatory Visit (INDEPENDENT_AMBULATORY_CARE_PROVIDER_SITE_OTHER): Payer: BLUE CROSS/BLUE SHIELD | Admitting: Family Medicine

## 2017-05-15 VITALS — BP 98/56 | HR 85 | Temp 98.3°F | Resp 99 | Ht 64.0 in | Wt 173.0 lb

## 2017-05-15 DIAGNOSIS — R569 Unspecified convulsions: Secondary | ICD-10-CM

## 2017-05-15 LAB — URINALYSIS, ROUTINE W REFLEX MICROSCOPIC
BILIRUBIN URINE: NEGATIVE
GLUCOSE, UA: NEGATIVE
Hgb urine dipstick: NEGATIVE
KETONES UR: NEGATIVE
Nitrite: NEGATIVE
Protein, ur: NEGATIVE
RBC / HPF: NONE SEEN /HPF (ref 0–2)
Specific Gravity, Urine: 1.02 (ref 1.001–1.03)
pH: 7 (ref 5.0–8.0)

## 2017-05-15 LAB — PREGNANCY, URINE: PREG TEST UR: NEGATIVE

## 2017-05-15 LAB — MICROSCOPIC MESSAGE
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
## 2017-05-15 NOTE — Patient Instructions (Signed)
Ibuprofen 600mg  for any headache in neck  Call if any other episodes occur

## 2017-05-16 ENCOUNTER — Encounter: Payer: Self-pay | Admitting: Family Medicine

## 2017-05-16 LAB — CBC WITH DIFFERENTIAL/PLATELET
Basophils Absolute: 56 cells/uL (ref 0–200)
Basophils Relative: 0.7 %
EOS ABS: 40 {cells}/uL (ref 15–500)
Eosinophils Relative: 0.5 %
HCT: 37.7 % (ref 35.0–45.0)
Hemoglobin: 12.4 g/dL (ref 11.7–15.5)
LYMPHS ABS: 2776 {cells}/uL (ref 850–3900)
MCH: 27 pg (ref 27.0–33.0)
MCHC: 32.9 g/dL (ref 32.0–36.0)
MCV: 82 fL (ref 80.0–100.0)
MPV: 10.1 fL (ref 7.5–12.5)
Monocytes Relative: 8.7 %
Neutro Abs: 4432 cells/uL (ref 1500–7800)
Neutrophils Relative %: 55.4 %
PLATELETS: 267 10*3/uL (ref 140–400)
RBC: 4.6 10*6/uL (ref 3.80–5.10)
RDW: 12.9 % (ref 11.0–15.0)
Total Lymphocyte: 34.7 %
WBC: 8 10*3/uL (ref 3.8–10.8)
WBCMIX: 696 {cells}/uL (ref 200–950)

## 2017-05-16 LAB — COMPREHENSIVE METABOLIC PANEL
AG Ratio: 1.3 (calc) (ref 1.0–2.5)
ALBUMIN MSPROF: 3.9 g/dL (ref 3.6–5.1)
ALKALINE PHOSPHATASE (APISO): 63 U/L (ref 33–115)
ALT: 11 U/L (ref 6–29)
AST: 12 U/L (ref 10–30)
BUN: 20 mg/dL (ref 7–25)
CO2: 25 mmol/L (ref 20–32)
CREATININE: 1.07 mg/dL (ref 0.50–1.10)
Calcium: 9 mg/dL (ref 8.6–10.2)
Chloride: 105 mmol/L (ref 98–110)
Globulin: 3 g/dL (calc) (ref 1.9–3.7)
Glucose, Bld: 81 mg/dL (ref 65–99)
Potassium: 4 mmol/L (ref 3.5–5.3)
Sodium: 140 mmol/L (ref 135–146)
Total Bilirubin: 0.4 mg/dL (ref 0.2–1.2)
Total Protein: 6.9 g/dL (ref 6.1–8.1)

## 2017-05-16 LAB — VITAMIN B12: VITAMIN B 12: 656 pg/mL (ref 200–1100)

## 2017-05-17 ENCOUNTER — Encounter: Payer: Self-pay | Admitting: Family Medicine

## 2017-05-23 ENCOUNTER — Encounter: Payer: Self-pay | Admitting: Adult Health

## 2017-05-23 ENCOUNTER — Ambulatory Visit (INDEPENDENT_AMBULATORY_CARE_PROVIDER_SITE_OTHER): Payer: BLUE CROSS/BLUE SHIELD | Admitting: Adult Health

## 2017-05-23 VITALS — BP 94/62 | HR 104 | Resp 18 | Ht 65.0 in | Wt 175.0 lb

## 2017-05-23 DIAGNOSIS — Z3201 Encounter for pregnancy test, result positive: Secondary | ICD-10-CM | POA: Diagnosis not present

## 2017-05-23 DIAGNOSIS — O3680X0 Pregnancy with inconclusive fetal viability, not applicable or unspecified: Secondary | ICD-10-CM

## 2017-05-23 DIAGNOSIS — N926 Irregular menstruation, unspecified: Secondary | ICD-10-CM | POA: Diagnosis not present

## 2017-05-23 DIAGNOSIS — Z3A01 Less than 8 weeks gestation of pregnancy: Secondary | ICD-10-CM

## 2017-05-23 LAB — POCT URINE PREGNANCY: Preg Test, Ur: POSITIVE — AB

## 2017-05-23 MED ORDER — PRENATAL PLUS 27-1 MG PO TABS: 1.0000 | ORAL_TABLET | Freq: Every day | ORAL | 12 refills | Status: DC

## 2017-05-23 NOTE — Progress Notes (Signed)
Subjective:     Patient ID: [name], female   DOB: Dec 14, 1995, 21 y.o.   MRN: [id number]  HPI Joan Andrews is a 21 year old black female in for UPT, has missed a period and had 2+HPT. PCP is Dr Buelah Manis.  Review of Systems +missed period with 2+HPT Reviewed past medical,surgical, social and family history. Reviewed medications and allergies.     Objective:   Physical Exam BP 94/62 (BP Location: Right Arm, Patient Position: Sitting, Cuff Size: Normal)   Pulse (!) 104   Resp 18   Ht 5\' 5"  (1.651 m)   Wt 175 lb (79.4 kg)   LMP 04/16/2017 (Exact Date)   Breastfeeding? No   BMI 29.12 kg/m UPT +, about 6+1 week by LMP with EDD 01/14/18. Skin warm and dry. Neck: mid line trachea, normal thyroid, good ROM, no lymphadenopathy noted. Lungs: clear to ausculation bilaterally. Cardiovascular: regular rate and rhythm.Abdomen is soft and non tender.    Assessment:     1. Positive pregnancy test   2. Less than [redacted] weeks gestation of pregnancy   3. Encounter to determine fetal viability of pregnancy, single or unspecified fetus       Plan:     Meds ordered this encounter  Medications  . prenatal vitamin w/FE, FA (PRENATAL 1 + 1) 27-1 MG TABS tablet    Sig: Take 1 tablet by mouth daily at 12 noon.    Dispense:  30 each    Refill:  12    Order Specific Question:   Supervising Provider    Answer:   Florian Buff [[id number]]  Return in 1 week for dating [place] Review handouts on first trimester and by [facility]

## 2017-05-23 NOTE — Patient Instructions (Signed)
First Trimester of Pregnancy The first trimester of pregnancy is from week 1 until the end of week 13 (months 1 through 3). A week after a sperm fertilizes an egg, the egg will implant on the wall of the uterus. This embryo will begin to develop into a baby. Genes from you and your partner will form the baby. The female genes will determine whether the baby will be a boy or a girl. At 6-8 weeks, the eyes and face will be formed, and the heartbeat can be seen on ultrasound. At the end of 12 weeks, all the baby's organs will be formed. Now that you are pregnant, you will want to do everything you can to have a healthy baby. Two of the most important things are to get good prenatal care and to follow your health care provider's instructions. Prenatal care is all the medical care you receive before the baby's birth. This care will help prevent, find, and treat any problems during the pregnancy and childbirth. Body changes during your first trimester Your body goes through many changes during pregnancy. The changes vary from woman to woman.  You may gain or lose a couple of pounds at first.  You may feel sick to your stomach (nauseous) and you may throw up (vomit). If the vomiting is uncontrollable, call your health care provider.  You may tire easily.  You may develop headaches that can be relieved by medicines. All medicines should be approved by your health care provider.  You may urinate more often. Painful urination may mean you have a bladder infection.  You may develop heartburn as a result of your pregnancy.  You may develop constipation because certain hormones are causing the muscles that push stool through your intestines to slow down.  You may develop hemorrhoids or swollen veins (varicose veins).  Your breasts may begin to grow larger and become tender. Your nipples may stick out more, and the tissue that surrounds them (areola) may become darker.  Your gums may bleed and may be  sensitive to brushing and flossing.  Dark spots or blotches (chloasma, mask of pregnancy) may develop on your face. This will likely fade after the baby is born.  Your menstrual periods will stop.  You may have a loss of appetite.  You may develop cravings for certain kinds of food.  You may have changes in your emotions from day to day, such as being excited to be pregnant or being concerned that something may go wrong with the pregnancy and baby.  You may have more vivid and strange dreams.  You may have changes in your hair. These can include thickening of your hair, rapid growth, and changes in texture. Some women also have hair loss during or after pregnancy, or hair that feels dry or thin. Your hair will most likely return to normal after your baby is born.  What to expect at prenatal visits During a routine prenatal visit:  You will be weighed to make sure you and the baby are growing normally.  Your blood pressure will be taken.  Your abdomen will be measured to track your baby's growth.  The fetal heartbeat will be listened to between weeks 10 and 14 of your pregnancy.  Test results from any previous visits will be discussed.  Your health care provider may ask you:  How you are feeling.  If you are feeling the baby move.  If you have had any abnormal symptoms, such as leaking fluid, bleeding, severe headaches,   or abdominal cramping.  If you are using any tobacco products, including cigarettes, chewing tobacco, and electronic cigarettes.  If you have any questions.  Other tests that may be performed during your first trimester include:  Blood tests to find your blood type and to check for the presence of any previous infections. The tests will also be used to check for low iron levels (anemia) and protein on red blood cells (Rh antibodies). Depending on your risk factors, or if you previously had diabetes during pregnancy, you may have tests to check for high blood  sugar that affects pregnant women (gestational diabetes).  Urine tests to check for infections, diabetes, or protein in the urine.  An ultrasound to confirm the proper growth and development of the baby.  Fetal screens for spinal cord problems (spina bifida) and Down syndrome.  HIV (human immunodeficiency virus) testing. Routine prenatal testing includes screening for HIV, unless you choose not to have this test.  You may need other tests to make sure you and the baby are doing well.  Follow these instructions at home: Medicines  Follow your health care provider's instructions regarding medicine use. Specific medicines may be either safe or unsafe to take during pregnancy.  Take a prenatal vitamin that contains at least 600 micrograms (mcg) of folic acid.  If you develop constipation, try taking a stool softener if your health care provider approves. Eating and drinking  Eat a balanced diet that includes fresh fruits and vegetables, whole grains, good sources of protein such as meat, eggs, or tofu, and low-fat dairy. Your health care provider will help you determine the amount of weight gain that is right for you.  Avoid raw meat and uncooked cheese. These carry germs that can cause birth defects in the baby.  Eating four or five small meals rather than three large meals a day may help relieve nausea and vomiting. If you start to feel nauseous, eating a few soda crackers can be helpful. Drinking liquids between meals, instead of during meals, also seems to help ease nausea and vomiting.  Limit foods that are high in fat and processed sugars, such as fried and sweet foods.  To prevent constipation: ? Eat foods that are high in fiber, such as fresh fruits and vegetables, whole grains, and beans. ? Drink enough fluid to keep your urine clear or pale yellow. Activity  Exercise only as directed by your health care provider. Most women can continue their usual exercise routine during  pregnancy. Try to exercise for 30 minutes at least 5 days a week. Exercising will help you: ? Control your weight. ? Stay in shape. ? Be prepared for labor and delivery.  Experiencing pain or cramping in the lower abdomen or lower back is a good sign that you should stop exercising. Check with your health care provider before continuing with normal exercises.  Try to avoid standing for long periods of time. Move your legs often if you must stand in one place for a long time.  Avoid heavy lifting.  Wear low-heeled shoes and practice good posture.  You may continue to have sex unless your health care provider tells you not to. Relieving pain and discomfort  Wear a good support bra to relieve breast tenderness.  Take warm sitz baths to soothe any pain or discomfort caused by hemorrhoids. Use hemorrhoid cream if your health care provider approves.  Rest with your legs elevated if you have leg cramps or low back pain.  If you develop   varicose veins in your legs, wear support hose. Elevate your feet for 15 minutes, 3-4 times a day. Limit salt in your diet. Prenatal care  Schedule your prenatal visits by the twelfth week of pregnancy. They are usually scheduled monthly at first, then more often in the last 2 months before delivery.  Write down your questions. Take them to your prenatal visits.  Keep all your prenatal visits as told by your health care provider. This is important. Safety  Wear your seat belt at all times when driving.  Make a list of emergency phone numbers, including numbers for family, friends, the hospital, and police and fire departments. General instructions  Ask your health care provider for a referral to a local prenatal education class. Begin classes no later than the beginning of month 6 of your pregnancy.  Ask for help if you have counseling or nutritional needs during pregnancy. Your health care provider can offer advice or refer you to specialists for help  with various needs.  Do not use hot tubs, steam rooms, or saunas.  Do not douche or use tampons or scented sanitary pads.  Do not cross your legs for long periods of time.  Avoid cat litter boxes and soil used by cats. These carry germs that can cause birth defects in the baby and possibly loss of the fetus by miscarriage or stillbirth.  Avoid all smoking, herbs, alcohol, and medicines not prescribed by your health care provider. Chemicals in these products affect the formation and growth of the baby.  Do not use any products that contain nicotine or tobacco, such as cigarettes and e-cigarettes. If you need help quitting, ask your health care provider. You may receive counseling support and other resources to help you quit.  Schedule a dentist appointment. At home, brush your teeth with a soft toothbrush and be gentle when you floss. Contact a health care provider if:  You have dizziness.  You have mild pelvic cramps, pelvic pressure, or nagging pain in the abdominal area.  You have persistent nausea, vomiting, or diarrhea.  You have a bad smelling vaginal discharge.  You have pain when you urinate.  You notice increased swelling in your face, hands, legs, or ankles.  You are exposed to fifth disease or chickenpox.  You are exposed to German measles (rubella) and have never had it. Get help right away if:  You have a fever.  You are leaking fluid from your vagina.  You have spotting or bleeding from your vagina.  You have severe abdominal cramping or pain.  You have rapid weight gain or loss.  You vomit blood or material that looks like coffee grounds.  You develop a severe headache.  You have shortness of breath.  You have any kind of trauma, such as from a fall or a car accident. Summary  The first trimester of pregnancy is from week 1 until the end of week 13 (months 1 through 3).  Your body goes through many changes during pregnancy. The changes vary from  woman to woman.  You will have routine prenatal visits. During those visits, your health care provider will examine you, discuss any test results you may have, and talk with you about how you are feeling. This information is not intended to replace advice given to you by your health care provider. Make sure you discuss any questions you have with your health care provider. Document Released: 07/25/2001 Document Revised: 07/12/2016 Document Reviewed: 07/12/2016 Elsevier Interactive Patient Education  2017 [employer]vier   [employer]  

## 2017-05-30 ENCOUNTER — Ambulatory Visit (INDEPENDENT_AMBULATORY_CARE_PROVIDER_SITE_OTHER): Payer: BLUE CROSS/BLUE SHIELD

## 2017-05-30 DIAGNOSIS — Z3A01 Less than 8 weeks gestation of pregnancy: Secondary | ICD-10-CM | POA: Diagnosis not present

## 2017-05-30 DIAGNOSIS — O3680X0 Pregnancy with inconclusive fetal viability, not applicable or unspecified: Secondary | ICD-10-CM

## 2017-05-30 NOTE — Progress Notes (Signed)
[place] 6+1 wks GS w/ys,no fetal pole visualized,normal ovaries bialt,GS 13.6 mm,pt will come back in 10 days for f/u ultrasound

## 2017-06-08 ENCOUNTER — Other Ambulatory Visit: Payer: Self-pay | Admitting: Obstetrics & Gynecology

## 2017-06-08 DIAGNOSIS — O3680X0 Pregnancy with inconclusive fetal viability, not applicable or unspecified: Secondary | ICD-10-CM

## 2017-06-11 ENCOUNTER — Ambulatory Visit (INDEPENDENT_AMBULATORY_CARE_PROVIDER_SITE_OTHER): Payer: BLUE CROSS/BLUE SHIELD

## 2017-06-11 DIAGNOSIS — O3680X0 Pregnancy with inconclusive fetal viability, not applicable or unspecified: Secondary | ICD-10-CM

## 2017-06-11 DIAGNOSIS — Z3A01 Less than 8 weeks gestation of pregnancy: Secondary | ICD-10-CM | POA: Diagnosis not present

## 2017-06-11 NOTE — Progress Notes (Signed)
[place] 7+1 wks,single IUP w/ys,positive fht 140 bpm,normal ovaries bilat,crl 10.79

## 2017-06-22 ENCOUNTER — Other Ambulatory Visit: Payer: Self-pay

## 2017-06-22 ENCOUNTER — Emergency Department (HOSPITAL_COMMUNITY)
Admission: EM | Admit: 2017-06-22 | Discharge: 2017-06-22 | Disposition: A | Discharge status: Home / Self Care | Payer: Medicaid Other | Attending: Emergency Medicine | Admitting: Emergency Medicine

## 2017-06-22 ENCOUNTER — Encounter (HOSPITAL_COMMUNITY): Payer: Self-pay

## 2017-06-22 DIAGNOSIS — R1030 Lower abdominal pain, unspecified: Secondary | ICD-10-CM | POA: Diagnosis not present

## 2017-06-22 DIAGNOSIS — R109 Unspecified abdominal pain: Secondary | ICD-10-CM

## 2017-06-22 DIAGNOSIS — Z79899 Other long term (current) drug therapy: Secondary | ICD-10-CM | POA: Diagnosis not present

## 2017-06-22 DIAGNOSIS — R42 Dizziness and giddiness: Secondary | ICD-10-CM | POA: Diagnosis not present

## 2017-06-22 DIAGNOSIS — Z3A09 9 weeks gestation of pregnancy: Secondary | ICD-10-CM | POA: Diagnosis not present

## 2017-06-22 DIAGNOSIS — R110 Nausea: Secondary | ICD-10-CM | POA: Diagnosis present

## 2017-06-22 DIAGNOSIS — O9989 Other specified diseases and conditions complicating pregnancy, childbirth and the puerperium: Secondary | ICD-10-CM | POA: Insufficient documentation

## 2017-06-22 DIAGNOSIS — O26891 Other specified pregnancy related conditions, first trimester: Secondary | ICD-10-CM

## 2017-06-22 LAB — COMPREHENSIVE METABOLIC PANEL
ALT: 16 U/L (ref 14–54)
AST: 15 U/L (ref 15–41)
Albumin: 3.6 g/dL (ref 3.5–5.0)
Alkaline Phosphatase: 60 U/L (ref 38–126)
Anion gap: 7 (ref 5–15)
BUN: 16 mg/dL (ref 6–20)
CO2: 26 mmol/L (ref 22–32)
Calcium: 9.3 mg/dL (ref 8.9–10.3)
Chloride: 106 mmol/L (ref 101–111)
Creatinine, Ser: 0.76 mg/dL (ref 0.44–1.00)
GFR calc Af Amer: >60 mL/min (ref >60)
GFR calc non Af Amer: >60 mL/min (ref >60)
Glucose, Bld: 99 mg/dL (ref 65–99)
Potassium: 3.8 mmol/L (ref 3.5–5.1)
Sodium: 139 mmol/L (ref 135–145)
Total Bilirubin: 0.4 mg/dL (ref 0.3–1.2)
Total Protein: 7.6 g/dL (ref 6.5–8.1)

## 2017-06-22 LAB — URINALYSIS, ROUTINE W REFLEX MICROSCOPIC
Bacteria, UA: NONE SEEN
Bilirubin Urine: NEGATIVE
Glucose, UA: NEGATIVE mg/dL
Hgb urine dipstick: NEGATIVE
Ketones, ur: NEGATIVE mg/dL
Nitrite: NEGATIVE
Protein, ur: NEGATIVE mg/dL
Specific Gravity, Urine: 1.032 — ABNORMAL HIGH (ref 1.005–1.030)
pH: 5 (ref 5.0–8.0)

## 2017-06-22 LAB — CBC
HCT: 36.7 % (ref 36.0–46.0)
Hemoglobin: 12.2 g/dL (ref 12.0–15.0)
MCH: 27.8 pg (ref 26.0–34.0)
MCHC: 33.2 g/dL (ref 30.0–36.0)
MCV: 83.6 fL (ref 78.0–100.0)
Platelets: 256 10*3/uL (ref 150–400)
RBC: 4.39 MIL/uL (ref 3.87–5.11)
RDW: 13.8 % (ref 11.5–15.5)
WBC: 7.8 10*3/uL (ref 4.0–10.5)

## 2017-06-22 LAB — PREGNANCY, URINE: Preg Test, Ur: POSITIVE — AB

## 2017-06-22 LAB — LIPASE, BLOOD: Lipase: 25 U/L (ref 11–51)

## 2017-06-22 MED ORDER — ONDANSETRON HCL 4 MG PO TABS: 4.0000 mg | ORAL_TABLET | Freq: Four times a day (QID) | ORAL | 0 refills | Status: DC

## 2017-06-22 NOTE — ED Provider Notes (Signed)
[facility] EMERGENCY DEPARTMENT Provider Note   CSN: [id number] Arrival date & time: 06/22/17  1653     History   Chief Complaint Chief Complaint  Patient presents with  . Abdominal Pain    HPI Joan Andrews is a 21 y.o. female.  HPI   21 year old female with abdominal pain.  Onset about 3 days ago.  Pain is in the lower abdomen to pelvis.  She feels it all the way across.  Does not particularly lateralize.  She describes the pain is intermittent and sharp.  Last seconds.  Comes and goes without any appreciable exacerbating relieving factors.  She is pregnant.  She had a ultrasound on 10/20 that showed an IUP.  Fetal heart tones were present at that time.  Currently she is approximately 9 weeks 4 days gestation.  Denies any urinary complaints.  No vaginal bleeding.  Past Medical History:  Diagnosis Date  . Bronchitis    as a child only  . Contraceptive management 12/26/2013  . Miscarriage   . Pregnant 07/14/2014  . Recurrent tonsillitis   . Supervision of normal pregnancy in first trimester 07/27/2015    [place] Initiated Care at   7+5 weeks FOB  Stephanie Acre 23 yo bm Dating By  LMP and [place] Pap  07/27/15 GC/CT Initial:                36+wks: Genetic Screen NT/IT:  CF screen  Anatomic [place]  Flu vaccine  Tdap Recommended ~ 28wks Glucose Screen  2 hr GBS  Feed Preference  Contraception  Circumcision  Childbirth Classes  Pediatrician      Patient Active Problem List   Diagnosis Date Noted  . Encounter for initial prescription of transdermal patch hormonal contraceptive device 03/01/2017  . Encounter for IUD removal 02/08/2017  . Chlamydia infection affecting pregnancy in third trimester, antepartum 02/16/2016    Past Surgical History:  Procedure Laterality Date  . HERNIA REPAIR      OB History    Gravida Para Term Preterm AB Living   3 1 1   1 1    SAB TAB Ectopic Multiple Live Births   1     0 1       Home Medications    Prior to Admission  medications   Medication Sig Start Date End Date Taking? Authorizing Provider  prenatal vitamin w/FE, FA (PRENATAL 1 + 1) 27-1 MG TABS tablet Take 1 tablet by mouth daily at 12 noon. 05/23/17   Estill Dooms, NP    Family History Family History  Problem Relation Age of Onset  . Hypertension Father   . Alzheimer's disease Maternal Grandmother   . Hypertension Mother     Social History Social History   Tobacco Use  . Smoking status: Never Smoker  . Smokeless tobacco: Never Used  Substance Use Topics  . Alcohol use: No  . Drug use: No     Allergies   Patient has no known allergies.   Review of Systems Review of Systems  All systems reviewed and negative, other than as noted in HPI.   Physical Exam Updated Vital Signs BP 109/69 (BP Location: Right Arm)   Pulse 96   Temp 98.4 F (36.9 C) (Oral)   Resp 16   Ht 5\' 5"  (1.651 m)   Wt 78.9 kg (174 lb)   LMP 04/16/2017 (Exact Date)   SpO2 99%   BMI 28.96 kg/m   Physical Exam  Constitutional: She appears well-developed  and well-nourished. No distress.  HENT:  Head: Normocephalic and atraumatic.  Eyes: Conjunctivae are normal. Right eye exhibits no discharge. Left eye exhibits no discharge.  Neck: Neck supple.  Cardiovascular: Normal rate, regular rhythm and normal heart sounds. Exam reveals no gallop and no friction rub.  No murmur heard. Pulmonary/Chest: Effort normal and breath sounds normal. No respiratory distress.  Abdominal: Soft. She exhibits no distension. There is no tenderness.  Musculoskeletal: She exhibits no edema or tenderness.  Neurological: She is alert.  Skin: Skin is warm and dry.  Psychiatric: She has a normal mood and affect. Her behavior is normal. Thought content normal.  Nursing note and vitals reviewed.    ED Treatments / Results  Labs (all labs ordered are listed, but only abnormal results are displayed) Labs Reviewed  URINALYSIS, ROUTINE W REFLEX MICROSCOPIC - Abnormal;  Notable for the following components:      Result Value   APPearance HAZY (*)    Specific Gravity, Urine 1.032 (*)    Leukocytes, UA SMALL (*)    Squamous Epithelial / LPF 6-30 (*)    All other components within normal limits  PREGNANCY, URINE - Abnormal; Notable for the following components:   Preg Test, Ur POSITIVE (*)    All other components within normal limits  LIPASE, BLOOD  COMPREHENSIVE METABOLIC PANEL  CBC    EKG  EKG Interpretation None       Radiology No results found.  Procedures Procedures (including critical care time)  Medications Ordered in ED Medications - No data to display   Initial Impression / Assessment and Plan / ED Course  I have reviewed the triage vital signs and the nursing notes.  Pertinent labs & imaging results that were available during my care of the patient were reviewed by me and considered in my medical decision making (see chart for details).     Lower abdominal pain in first trimester pregnancy.  Her abdominal exam is reassuring.  Outpatient ultrasound showed IUP.  Doubt acute surgical process or other emergent condition.   Final Clinical Impressions(s) / ED Diagnoses   Final diagnoses:  Abdominal pain during pregnancy in first trimester    ED Discharge Orders    None       Virgel Manifold, MD 06/29/17 1436

## 2017-06-22 NOTE — ED Notes (Signed)
Attempted to obtain FHR without success.  Pt believes she is [redacted] weeks pregnant.  Third pregnancy per pt, last ended in a miscarriage.  Pt denies any vaginal discharge or bleeding.  + nausea but denies emesis, + dizziness.

## 2017-06-22 NOTE — ED Triage Notes (Signed)
Lower abdominal pain x3 days. Currently [redacted] weeks pregnant. Denies N/V/D or vaginal bleeding.

## 2017-06-26 ENCOUNTER — Telehealth: Payer: Self-pay | Admitting: *Deleted

## 2017-06-26 ENCOUNTER — Ambulatory Visit: Payer: BLUE CROSS/BLUE SHIELD | Admitting: *Deleted

## 2017-06-26 ENCOUNTER — Ambulatory Visit (INDEPENDENT_AMBULATORY_CARE_PROVIDER_SITE_OTHER): Payer: Medicaid Other | Admitting: Women's Health

## 2017-06-26 ENCOUNTER — Encounter: Payer: Self-pay | Admitting: Women's Health

## 2017-06-26 VITALS — BP 120/70 | HR 75 | Wt 179.2 lb

## 2017-06-26 DIAGNOSIS — Z3682 Encounter for antenatal screening for nuchal translucency: Secondary | ICD-10-CM

## 2017-06-26 DIAGNOSIS — Z1389 Encounter for screening for other disorder: Secondary | ICD-10-CM

## 2017-06-26 DIAGNOSIS — Z3481 Encounter for supervision of other normal pregnancy, first trimester: Secondary | ICD-10-CM

## 2017-06-26 DIAGNOSIS — Z3A09 9 weeks gestation of pregnancy: Secondary | ICD-10-CM | POA: Diagnosis not present

## 2017-06-26 DIAGNOSIS — Z331 Pregnant state, incidental: Secondary | ICD-10-CM

## 2017-06-26 DIAGNOSIS — Z3490 Encounter for supervision of normal pregnancy, unspecified, unspecified trimester: Secondary | ICD-10-CM | POA: Insufficient documentation

## 2017-06-26 LAB — POCT URINALYSIS DIPSTICK
Blood, UA: NEGATIVE
GLUCOSE UA: NEGATIVE
Ketones, UA: NEGATIVE
Leukocytes, UA: NEGATIVE
NITRITE UA: NEGATIVE

## 2017-06-26 MED ORDER — DOXYLAMINE-PYRIDOXINE ER 20-20 MG PO TBCR: 1.0000 | EXTENDED_RELEASE_TABLET | Freq: Every day | ORAL | 8 refills | Status: DC

## 2017-06-26 NOTE — Telephone Encounter (Signed)
Informed pharmacy PA for was approved for [facility].

## 2017-06-26 NOTE — Progress Notes (Signed)
INITIAL OBSTETRICAL VISIT Patient name: Joan Andrews MRN [id number]  Date of birth: 1996/03/11 Chief Complaint:   Initial Prenatal Visit (nausea)  History of Present Illness:   Joan Andrews is a 21 y.o. G63P1011 African American female at [redacted]w[redacted]d by 7wk u/s, with an Estimated Date of Delivery: 01/27/18 being seen today for her initial obstetrical visit.   Her obstetrical history is significant for term uncomplicated svb x 1.   Today she reports nausea- requests meds.  Patient's last menstrual period was 04/16/2017 (exact date). Last pap 02/08/17. Results were: normal Review of Systems:   Pertinent items are noted in HPI Denies cramping/contractions, leakage of fluid, vaginal bleeding, abnormal vaginal discharge w/ itching/odor/irritation, headaches, visual changes, shortness of breath, chest pain, abdominal pain, severe nausea/vomiting, or problems with urination or bowel movements unless otherwise stated above.  Pertinent History Reviewed:  Reviewed past medical,surgical, social, obstetrical and family history.  Reviewed problem list, medications and allergies. OB History  Gravida Para Term Preterm AB Living  3 1 1   1 1   SAB TAB Ectopic Multiple Live Births  1     0 1    # Outcome Date GA Lbr Len/2nd Weight Sex Delivery Anes PTL Lv  3 Current           2 Term 03/01/16 [redacted]w[redacted]d 26:46 / 00:17 6 lb 14.6 oz (3.135 kg) M Vag-Spont Local N LIV     Birth Comments: none  1 SAB 2014             Physical Assessment:   Vitals:   06/26/17 0959  BP: 120/70  Pulse: 75  Weight: 179 lb 3.2 oz (81.3 kg)  Body mass index is 29.82 kg/m.       Physical Examination:  General appearance - well appearing, and in no distress  Mental status - alert, oriented to person, place, and time  Psych:  She has a normal mood and affect  Skin - warm and dry, normal color, no suspicious lesions noted  Chest - effort normal, all lung fields clear to auscultation bilaterally  Heart - normal rate  and regular rhythm  Abdomen - soft, nontender  Extremities:  No swelling or varicosities noted  Thin prep pap is not done  Fetal Heart Rate (bpm): +u/s via informal transabdominal u/s, +active fetus  Results for orders placed or performed in visit on 06/26/17 (from the past 24 hour(s))  POCT urinalysis dipstick   Collection Time: 06/26/17 10:17 AM  Result Value Ref Range   Color, UA     Clarity, UA     Glucose, UA neg    Bilirubin, UA     Ketones, UA neg    Spec Grav, UA  1.010 - 1.025   Blood, UA neg    pH, UA  5.0 - 8.0   Protein, UA trace    Urobilinogen, UA  0.2 or 1.0 E.U./dL   Nitrite, UA neg    Leukocytes, UA Negative Negative    Assessment & Plan:  1) Low-Risk Pregnancy G3P1011 at [redacted]w[redacted]d with an Estimated Date of Delivery: 01/27/18   2) Initial OB visit  Initial labs obtained Continue prenatal vitamins Reviewed n/v relief measures and warning s/s to report Reviewed recommended weight gain based on pre-gravid BMI Encouraged well-balanced diet Genetic Screening discussed Integrated Screen: requested Cystic fibrosis screening discussed neg prev preg Ultrasound discussed; fetal survey: requested CCNC completed> talked to [place] today Rx [place], msg sent to [place] for [place] Declined flu shot. She  was advised that the flu shot is recommended during pregnancy to help protect her and her baby. We discussed that it is an inactivated vaccine-so side effects are minimal, it is considered safe to receive during any trimester, and pregnant women are at a higher risk of developing potential complications from the flu, including death. She was given printed information from the CDC regarding the flu shot and the flu.    Follow-up: Return in about 4 weeks (around 07/24/2017) for LROB, US:NT+1stIT.   Orders Placed This Encounter  Procedures  . GC/Chlamydia Probe Amp  . Urine Culture  . US Fetal Nuchal Translucency Measurement  . CBC  . Hepatitis B surface antigen  . HIV antibody    . Varicella zoster antibody, IgG  . Urinalysis, Routine w reflex microscopic  . Rubella screen  . RPR  . Pain Management Screening Profile (10S)  . POCT urinalysis dipstick  . ABO/Rh  . Antibody screen    Tawnya Crook CNM, [facility] 06/26/2017 11:24 AM

## 2017-06-26 NOTE — Patient Instructions (Addendum)
Joan Andrews, I greatly value your feedback.  If you receive a survey following your visit with Joan Andrews today, we appreciate you taking the time to fill it out.  Thanks, Knute Neu, CNM, WHNP-BC   Nausea & Vomiting  Have saltine crackers or pretzels by your bed and eat a few bites before you raise your head out of bed in the morning  Eat small frequent meals throughout the day instead of large meals  Drink plenty of fluids throughout the day to stay hydrated, just don't drink a lot of fluids with your meals.  This can make your stomach fill up faster making you feel sick  Do not brush your teeth right after you eat  Products with real ginger are good for nausea, like ginger ale and ginger hard candy Make sure it says made with real ginger!  Sucking on sour candy like lemon heads is also good for nausea  If your prenatal vitamins make you nauseated, take them at night so you will sleep through the nausea  Sea Bands  If you feel like you need medicine for the nausea & vomiting please let Joan Andrews know  If you are unable to keep any fluids or food down please let Joan Andrews know   Constipation  Drink plenty of fluid, preferably water, throughout the day  Eat foods high in fiber such as fruits, vegetables, and grains  Exercise, such as walking, is a good way to keep your bowels regular  Drink warm fluids, especially warm prune juice, or decaf coffee  Eat a 1/2 cup of real oatmeal (not instant), 1/2 cup applesauce, and 1/2-1 cup warm prune juice every day  If needed, you may take Colace (docusate sodium) stool softener once or twice a day to help keep the stool soft. If you are pregnant, wait until you are out of your first trimester (12-14 weeks of pregnancy)  If you still are having problems with constipation, you may take Miralax once daily as needed to help keep your bowels regular.  If you are pregnant, wait until you are out of your first trimester (12-14 weeks of pregnancy)   First  Trimester of Pregnancy The first trimester of pregnancy is from week 1 until the end of week 12 (months 1 through 3). A week after a sperm fertilizes an egg, the egg will implant on the wall of the uterus. This embryo will begin to develop into a baby. Genes from you and your partner are forming the baby. The female genes determine whether the baby is a boy or a girl. At 6-8 weeks, the eyes and face are formed, and the heartbeat can be seen on ultrasound. At the end of 12 weeks, all the baby's organs are formed.  Now that you are pregnant, you will want to do everything you can to have a healthy baby. Two of the most important things are to get good prenatal care and to follow your health care provider's instructions. Prenatal care is all the medical care you receive before the baby's birth. This care will help prevent, find, and treat any problems during the pregnancy and childbirth. BODY CHANGES Your body goes through many changes during pregnancy. The changes vary from woman to woman.   You may gain or lose a couple of pounds at first.  You may feel sick to your stomach (nauseous) and throw up (vomit). If the vomiting is uncontrollable, call your health care provider.  You may tire easily.  You may develop headaches  that can be relieved by medicines approved by your health care provider.  You may urinate more often. Painful urination may mean you have a bladder infection.  You may develop heartburn as a result of your pregnancy.  You may develop constipation because certain hormones are causing the muscles that push waste through your intestines to slow down.  You may develop hemorrhoids or swollen, bulging veins (varicose veins).  Your breasts may begin to grow larger and become tender. Your nipples may stick out more, and the tissue that surrounds them (areola) may become darker.  Your gums may bleed and may be sensitive to brushing and flossing.  Dark spots or blotches (chloasma, mask  of pregnancy) may develop on your face. This will likely fade after the baby is born.  Your menstrual periods will stop.  You may have a loss of appetite.  You may develop cravings for certain kinds of food.  You may have changes in your emotions from day to day, such as being excited to be pregnant or being concerned that something may go wrong with the pregnancy and baby.  You may have more vivid and strange dreams.  You may have changes in your hair. These can include thickening of your hair, rapid growth, and changes in texture. Some women also have hair loss during or after pregnancy, or hair that feels dry or thin. Your hair will most likely return to normal after your baby is born. WHAT TO EXPECT AT YOUR PRENATAL VISITS During a routine prenatal visit:  You will be weighed to make sure you and the baby are growing normally.  Your blood pressure will be taken.  Your abdomen will be measured to track your baby's growth.  The fetal heartbeat will be listened to starting around week 10 or 12 of your pregnancy.  Test results from any previous visits will be discussed. Your health care provider may ask you:  How you are feeling.  If you are feeling the baby move.  If you have had any abnormal symptoms, such as leaking fluid, bleeding, severe headaches, or abdominal cramping.  If you have any questions. Other tests that may be performed during your first trimester include:  Blood tests to find your blood type and to check for the presence of any previous infections. They will also be used to check for low iron levels (anemia) and Rh antibodies. Later in the pregnancy, blood tests for diabetes will be done along with other tests if problems develop.  Urine tests to check for infections, diabetes, or protein in the urine.  An ultrasound to confirm the proper growth and development of the baby.  An amniocentesis to check for possible genetic problems.  Fetal screens for spina  bifida and Down syndrome.  You may need other tests to make sure you and the baby are doing well. HOME CARE INSTRUCTIONS  Medicines  Follow your health care provider's instructions regarding medicine use. Specific medicines may be either safe or unsafe to take during pregnancy.  Take your prenatal vitamins as directed.  If you develop constipation, try taking a stool softener if your health care provider approves. Diet  Eat regular, well-balanced meals. Choose a variety of foods, such as meat or vegetable-based protein, fish, milk and low-fat dairy products, vegetables, fruits, and whole grain breads and cereals. Your health care provider will help you determine the amount of weight gain that is right for you.  Avoid raw meat and uncooked cheese. These carry germs that can  cause birth defects in the baby.  Eating four or five small meals rather than three large meals a day may help relieve nausea and vomiting. If you start to feel nauseous, eating a few soda crackers can be helpful. Drinking liquids between meals instead of during meals also seems to help nausea and vomiting.  If you develop constipation, eat more high-fiber foods, such as fresh vegetables or fruit and whole grains. Drink enough fluids to keep your urine clear or pale yellow. Activity and Exercise  Exercise only as directed by your health care provider. Exercising will help you:  Control your weight.  Stay in shape.  Be prepared for labor and delivery.  Experiencing pain or cramping in the lower abdomen or low back is a good sign that you should stop exercising. Check with your health care provider before continuing normal exercises.  Try to avoid standing for long periods of time. Move your legs often if you must stand in one place for a long time.  Avoid heavy lifting.  Wear low-heeled shoes, and practice good posture.  You may continue to have sex unless your health care provider directs you  otherwise. Relief of Pain or Discomfort  Wear a good support bra for breast tenderness.   Take warm sitz baths to soothe any pain or discomfort caused by hemorrhoids. Use hemorrhoid cream if your health care provider approves.   Rest with your legs elevated if you have leg cramps or low back pain.  If you develop varicose veins in your legs, wear support hose. Elevate your feet for 15 minutes, 3-4 times a day. Limit salt in your diet. Prenatal Care  Schedule your prenatal visits by the twelfth week of pregnancy. They are usually scheduled monthly at first, then more often in the last 2 months before delivery.  Write down your questions. Take them to your prenatal visits.  Keep all your prenatal visits as directed by your health care provider. Safety  Wear your seat belt at all times when driving.  Make a list of emergency phone numbers, including numbers for family, friends, the hospital, and police and fire departments. General Tips  Ask your health care provider for a referral to a local prenatal education class. Begin classes no later than at the beginning of month 6 of your pregnancy.  Ask for help if you have counseling or nutritional needs during pregnancy. Your health care provider can offer advice or refer you to specialists for help with various needs.  Do not use hot tubs, steam rooms, or saunas.  Do not douche or use tampons or scented sanitary pads.  Do not cross your legs for long periods of time.  Avoid cat litter boxes and soil used by cats. These carry germs that can cause birth defects in the baby and possibly loss of the fetus by miscarriage or stillbirth.  Avoid all smoking, herbs, alcohol, and medicines not prescribed by your health care provider. Chemicals in these affect the formation and growth of the baby.  Schedule a dentist appointment. At home, brush your teeth with a soft toothbrush and be gentle when you floss. SEEK MEDICAL CARE IF:   You have  dizziness.  You have mild pelvic cramps, pelvic pressure, or nagging pain in the abdominal area.  You have persistent nausea, vomiting, or diarrhea.  You have a bad smelling vaginal discharge.  You have pain with urination.  You notice increased swelling in your face, hands, legs, or ankles. SEEK IMMEDIATE MEDICAL CARE IF:  You have a fever.  You are leaking fluid from your vagina.  You have spotting or bleeding from your vagina.  You have severe abdominal cramping or pain.  You have rapid weight gain or loss.  You vomit blood or material that looks like coffee grounds.  You are exposed to Joan Andrews measles and have never had them.  You are exposed to fifth disease or chickenpox.  You develop a severe headache.  You have shortness of breath.  You have any kind of trauma, such as from a fall or a car accident. Document Released: 07/25/2001 Document Revised: 12/15/2013 Document Reviewed: 06/10/2013 [facility] Patient Information 2015 [place], [place]. This information is not intended to replace advice given to you by your health care provider. Make sure you discuss any questions you have with your health care provider.  PROTECT YOURSELF & YOUR BABY FROM THE FLU! Because you are pregnant, we at [facility], along with the Centers for Disease Control (CDC), recommend that you receive the flu vaccine to protect yourself and your baby from the flu. The flu is more likely to cause severe illness in pregnant women than in women of reproductive age who are not pregnant. Changes in the immune system, heart, and lungs during pregnancy make pregnant women (and women up to two weeks postpartum) more prone to severe illness from flu, including illness resulting in hospitalization. Flu also may be harmful for a pregnant woman's developing baby. A common flu symptom is fever, which may be associated with neural tube defects and other adverse outcomes for a developing baby. Getting vaccinated can  also help protect a baby after birth from flu. (Mom passes antibodies onto the developing baby during her pregnancy.)  A Flu Vaccine is the Best Protection Against Flu Getting a flu vaccine is the first and most important step in protecting against flu. Pregnant women should get a flu shot and not the live attenuated influenza vaccine (LAIV), also known as nasal spray flu vaccine. Flu vaccines given during pregnancy help protect both the mother and her baby from flu. Vaccination has been shown to reduce the risk of flu-associated acute respiratory infection in pregnant women by up to one-half. A 2018 study showed that getting a flu shot reduced a pregnant woman's risk of being hospitalized with flu by an average of 40 percent. Pregnant women who get a flu vaccine are also helping to protect their babies from flu illness for the first several months after their birth, when they are too young to get vaccinated.   A Long Record of Safety for Flu Shots in Pregnant Women Flu shots have been given to millions of pregnant women over many years with a good safety record. There is a lot of evidence that flu vaccines can be given safely during pregnancy; though these data are limited for the first trimester. The CDC recommends that pregnant women get vaccinated during any trimester of their pregnancy. It is very important for pregnant women to get the flu shot.   Other Preventive Actions In addition to getting a flu shot, pregnant women should take the same everyday preventive actions the CDC recommends of everyone, including covering coughs, washing hands often, and avoiding people who are sick.  Symptoms and Treatment If you get sick with flu symptoms call your doctor right away. There are antiviral drugs that can treat flu illness and prevent serious flu complications. The CDC recommends prompt treatment for people who have influenza infection or suspected influenza infection and who are at  high risk of serious  flu complications, such as people with asthma, diabetes (including gestational diabetes), or heart disease. Early treatment of influenza in hospitalized pregnant women has been shown to reduce the length of the hospital stay.  Symptoms Flu symptoms include fever, cough, sore throat, runny or stuffy nose, body aches, headache, chills and fatigue. Some people may also have vomiting and diarrhea. People may be infected with the flu and have respiratory symptoms without a fever.  Early Treatment is Important for Pregnant Women Treatment should begin as soon as possible because antiviral drugs work best when started early (within 48 hours after symptoms start). Antiviral drugs can make your flu illness milder and make you feel better faster. They may also prevent serious health problems that can result from flu illness. Oral oseltamivir (Tamiflu) is the preferred treatment for pregnant women because it has the most studies available to suggest that it is safe and beneficial. Antiviral drugs require a prescription from your provider. Having a fever caused by flu infection or other infections early in pregnancy may be linked to birth defects in a baby. In addition to taking antiviral drugs, pregnant women who get a fever should treat their fever with Tylenol (acetaminophen) and contact their provider immediately.  When to [place] If you are pregnant and have any of these signs, seek care immediately:  Difficulty breathing or shortness of breath  Pain or pressure in the chest or abdomen  Sudden dizziness  Confusion  Severe or persistent vomiting  High fever that is not responding to Tylenol (or store brand equivalent)  Decreased or no movement of your baby  [email or web address]

## 2017-06-27 ENCOUNTER — Encounter: Payer: Self-pay | Admitting: Women's Health

## 2017-06-27 DIAGNOSIS — O09899 Supervision of other high risk pregnancies, unspecified trimester: Secondary | ICD-10-CM | POA: Insufficient documentation

## 2017-06-27 DIAGNOSIS — Z283 Underimmunization status: Secondary | ICD-10-CM

## 2017-06-27 LAB — URINALYSIS, ROUTINE W REFLEX MICROSCOPIC
BILIRUBIN UA: NEGATIVE
GLUCOSE, UA: NEGATIVE
Leukocytes, UA: NEGATIVE
Nitrite, UA: NEGATIVE
RBC UA: NEGATIVE
Specific Gravity, UA: >=1.03 — AB (ref 1.005–1.030)
UUROB: 0.2 mg/dL (ref 0.2–1.0)
pH, UA: 6 (ref 5.0–7.5)

## 2017-06-27 LAB — CBC
HEMATOCRIT: 38.2 % (ref 34.0–46.6)
HEMOGLOBIN: 12.2 g/dL (ref 11.1–15.9)
MCH: 27.1 pg (ref 26.6–33.0)
MCHC: 31.9 g/dL (ref 31.5–35.7)
MCV: 85 fL (ref 79–97)
Platelets: 256 10*3/uL (ref 150–379)
RBC: 4.5 x10E6/uL (ref 3.77–5.28)
RDW: 13.8 % (ref 12.3–15.4)
WBC: 6.8 10*3/uL (ref 3.4–10.8)

## 2017-06-27 LAB — PMP SCREEN PROFILE (10S), URINE
Amphetamine Scrn, Ur: NEGATIVE ng/mL
BARBITURATE SCREEN URINE: NEGATIVE ng/mL
BENZODIAZEPINE SCREEN, URINE: NEGATIVE ng/mL
CANNABINOIDS UR QL SCN: NEGATIVE ng/mL
Cocaine (Metab) Scrn, Ur: NEGATIVE ng/mL
Creatinine(Crt), U: 326.6 mg/dL — ABNORMAL HIGH (ref 20.0–300.0)
METHADONE SCREEN, URINE: NEGATIVE ng/mL
OXYCODONE+OXYMORPHONE UR QL SCN: NEGATIVE ng/mL
Opiate Scrn, Ur: NEGATIVE ng/mL
PHENCYCLIDINE QUANTITATIVE URINE: NEGATIVE ng/mL
Ph of Urine: 5.6 (ref 4.5–8.9)
Propoxyphene Scrn, Ur: NEGATIVE ng/mL

## 2017-06-27 LAB — VARICELLA ZOSTER ANTIBODY, IGG

## 2017-06-27 LAB — RPR: RPR Ser Ql: NONREACTIVE

## 2017-06-27 LAB — RUBELLA SCREEN: RUBELLA: 2.3 {index} (ref >0.99)

## 2017-06-27 LAB — HEPATITIS B SURFACE ANTIGEN: Hepatitis B Surface Ag: NEGATIVE

## 2017-06-27 LAB — ANTIBODY SCREEN: ANTIBODY SCREEN: NEGATIVE

## 2017-06-27 LAB — HIV ANTIBODY (ROUTINE TESTING W REFLEX): HIV SCREEN 4TH GENERATION: NONREACTIVE

## 2017-06-27 LAB — ABO/RH: Rh Factor: POSITIVE

## 2017-06-28 LAB — GC/CHLAMYDIA PROBE AMP
CHLAMYDIA, DNA PROBE: NEGATIVE
NEISSERIA GONORRHOEAE BY PCR: NEGATIVE

## 2017-06-28 LAB — URINE CULTURE

## 2017-07-14 ENCOUNTER — Encounter (HOSPITAL_COMMUNITY): Payer: Self-pay | Admitting: *Deleted

## 2017-07-14 ENCOUNTER — Other Ambulatory Visit: Payer: Self-pay

## 2017-07-14 ENCOUNTER — Inpatient Hospital Stay (HOSPITAL_COMMUNITY)
Admission: AD | Admit: 2017-07-14 | Discharge: 2017-07-14 | Disposition: A | Discharge status: Home / Self Care | Payer: Medicaid Other | Source: Ambulatory Visit | Attending: Obstetrics and Gynecology | Admitting: Obstetrics and Gynecology

## 2017-07-14 DIAGNOSIS — O26891 Other specified pregnancy related conditions, first trimester: Secondary | ICD-10-CM | POA: Insufficient documentation

## 2017-07-14 DIAGNOSIS — W1830XA Fall on same level, unspecified, initial encounter: Secondary | ICD-10-CM | POA: Insufficient documentation

## 2017-07-14 DIAGNOSIS — R569 Unspecified convulsions: Secondary | ICD-10-CM | POA: Diagnosis present

## 2017-07-14 DIAGNOSIS — Z3A11 11 weeks gestation of pregnancy: Secondary | ICD-10-CM | POA: Diagnosis not present

## 2017-07-14 DIAGNOSIS — Z711 Person with feared health complaint in whom no diagnosis is made: Secondary | ICD-10-CM | POA: Diagnosis not present

## 2017-07-14 DIAGNOSIS — Z79899 Other long term (current) drug therapy: Secondary | ICD-10-CM | POA: Diagnosis not present

## 2017-07-14 DIAGNOSIS — Z3491 Encounter for supervision of normal pregnancy, unspecified, first trimester: Secondary | ICD-10-CM

## 2017-07-14 DIAGNOSIS — O9989 Other specified diseases and conditions complicating pregnancy, childbirth and the puerperium: Secondary | ICD-10-CM

## 2017-07-14 LAB — COMPREHENSIVE METABOLIC PANEL
ALBUMIN: 3.3 g/dL — AB (ref 3.5–5.0)
ALK PHOS: 54 U/L (ref 38–126)
ALT: 15 U/L (ref 14–54)
ANION GAP: 8 (ref 5–15)
AST: 14 U/L — ABNORMAL LOW (ref 15–41)
BILIRUBIN TOTAL: 0.3 mg/dL (ref 0.3–1.2)
BUN: 15 mg/dL (ref 6–20)
CALCIUM: 8.8 mg/dL — AB (ref 8.9–10.3)
CO2: 24 mmol/L (ref 22–32)
CREATININE: 0.65 mg/dL (ref 0.44–1.00)
Chloride: 103 mmol/L (ref 101–111)
GLUCOSE: 89 mg/dL (ref 65–99)
POTASSIUM: 3.6 mmol/L (ref 3.5–5.1)
Sodium: 135 mmol/L (ref 135–145)
Total Protein: 7.3 g/dL (ref 6.5–8.1)

## 2017-07-14 LAB — URINALYSIS, ROUTINE W REFLEX MICROSCOPIC
Bacteria, UA: NONE SEEN
Bilirubin Urine: NEGATIVE
GLUCOSE, UA: NEGATIVE mg/dL
HGB URINE DIPSTICK: NEGATIVE
KETONES UR: NEGATIVE mg/dL
NITRITE: NEGATIVE
PH: 5 (ref 5.0–8.0)
PROTEIN: NEGATIVE mg/dL
SPECIFIC GRAVITY, URINE: 1.028 (ref 1.005–1.030)

## 2017-07-14 LAB — CBC
HEMATOCRIT: 34.3 % — AB (ref 36.0–46.0)
Hemoglobin: 11.4 g/dL — ABNORMAL LOW (ref 12.0–15.0)
MCH: 28.3 pg (ref 26.0–34.0)
MCHC: 33.2 g/dL (ref 30.0–36.0)
MCV: 85.1 fL (ref 78.0–100.0)
PLATELETS: 227 10*3/uL (ref 150–400)
RBC: 4.03 MIL/uL (ref 3.87–5.11)
RDW: 14.1 % (ref 11.5–15.5)
WBC: 8.3 10*3/uL (ref 4.0–10.5)

## 2017-07-14 LAB — RAPID URINE DRUG SCREEN, HOSP PERFORMED
AMPHETAMINES: NOT DETECTED
BENZODIAZEPINES: NOT DETECTED
Barbiturates: NOT DETECTED
Cocaine: NOT DETECTED
OPIATES: NOT DETECTED
Tetrahydrocannabinol: NOT DETECTED

## 2017-07-14 NOTE — MAU Provider Note (Signed)
History     CSN: [id number]  Arrival date and time: 07/14/17 1441   First Provider Initiated Contact with Patient 07/14/17 1525    Chief Complaint  Patient presents with  . "Mini Seizure"  . Fall   HPI Joan Andrews is a 21 y.o. G3P1011 at [redacted]w[redacted]d who presents stating she wants to be sure her baby is ok. She states she thinks she had a seizure on Thursday and fell. She reports sitting on her bed when she started shaking. She denies losing consciousness during the shaking that lasted "a few seconds." She states after the shaking, she stood up and attempted to go to the bathroom and fell. She denies hitting her abdomen on head when she fell. She denied any loss of consciousness during or after falling. She states she got up and had no other symptoms the rest of the day. No new symptoms since Thursday but came in today to check on the baby. She has no history of any seizure disorder and this has not happened before. She states she currently has a headache that she rates a 3/10 and has not tried anything for. She denies any abdominal pain, vaginal bleeding or discharge. She gets prenatal care at [facility].   OB History    Gravida Para Term Preterm AB Living   3 1 1   1 1    SAB TAB Ectopic Multiple Live Births   1     0 1      Past Medical History:  Diagnosis Date  . Bronchitis    as a child only  . Contraceptive management 12/26/2013  . Miscarriage   . Pregnant 07/14/2014  . Recurrent tonsillitis   . Supervision of normal pregnancy in first trimester 07/27/2015    [place] Initiated Care at   7+5 weeks FOB  Stephanie Acre 25 yo bm Dating By  LMP and [place] Pap  07/27/15 GC/CT Initial:                36+wks: Genetic Screen NT/IT:  CF screen  Anatomic [place]  Flu vaccine  Tdap Recommended ~ 28wks Glucose Screen  2 hr GBS  Feed Preference  Contraception  Circumcision  Childbirth Classes  Pediatrician      Past Surgical History:  Procedure Laterality Date  . HERNIA REPAIR       Family History  Problem Relation Age of Onset  . Hypertension Father   . Alzheimer's disease Maternal Grandmother   . Hypertension Mother   . Diabetes Paternal Uncle   . Diabetes Cousin     Social History   Tobacco Use  . Smoking status: Never Smoker  . Smokeless tobacco: Never Used  Substance Use Topics  . Alcohol use: No  . Drug use: No    Allergies: No Known Allergies  Medications Prior to Admission  Medication Sig Dispense Refill Last Dose  . Doxylamine-Pyridoxine ER (BONJESTA) 20-20 MG TBCR Take 1 tablet at bedtime by mouth. Can add 1 tablet in the morning if needed for nausea and vomiting 60 tablet 8   . ondansetron (ZOFRAN) 4 MG tablet Take 1 tablet (4 mg total) every 6 (six) hours by mouth. (Patient not taking: Reported on 06/26/2017) 12 tablet 0 Not Taking  . prenatal vitamin w/FE, FA (PRENATAL 1 + 1) 27-1 MG TABS tablet Take 1 tablet by mouth daily at 12 noon. 30 each 12 Taking    Review of Systems  Constitutional: Negative.  Negative for fatigue and fever.  HENT: Negative.   Respiratory: Negative.  Negative for shortness of breath.   Cardiovascular: Negative.  Negative for chest pain.  Gastrointestinal: Negative.  Negative for abdominal pain, constipation, diarrhea, nausea and vomiting.  Genitourinary: Negative.  Negative for dysuria, vaginal bleeding and vaginal discharge.  Neurological: Positive for headaches. Negative for dizziness.   Physical Exam   Blood pressure 125/70, pulse 95, temperature 98.2 F (36.8 C), temperature source Oral, resp. rate 16, height 5\' 5"  (1.651 m), weight 179 lb (81.2 kg), last menstrual period 04/16/2017, SpO2 99 %, not currently breastfeeding.  Physical Exam  Nursing note and vitals reviewed. Constitutional: She is oriented to person, place, and time. She appears well-developed and well-nourished. No distress.  HENT:  Head: Normocephalic.  Eyes: Pupils are equal, round, and reactive to light.  Cardiovascular: Normal  rate, regular rhythm and normal heart sounds.  Respiratory: Effort normal and breath sounds normal. No respiratory distress.  GI: Soft. Bowel sounds are normal. She exhibits no distension. There is no tenderness. There is no guarding.  Neurological: She is alert and oriented to person, place, and time.  Skin: Skin is warm and dry.  Psychiatric: She has a normal mood and affect. Her behavior is normal. Judgment and thought content normal.   FHT: 146  MAU Course  Procedures Results for orders placed or performed during the hospital encounter of 07/14/17 (from the past 24 hour(s))  Urinalysis, Routine w reflex microscopic     Status: Abnormal   Collection Time: 07/14/17  3:00 PM  Result Value Ref Range   Color, Urine YELLOW YELLOW   APPearance HAZY (A) CLEAR   Specific Gravity, Urine 1.028 1.005 - 1.030   pH 5.0 5.0 - 8.0   Glucose, UA NEGATIVE NEGATIVE mg/dL   Hgb urine dipstick NEGATIVE NEGATIVE   Bilirubin Urine NEGATIVE NEGATIVE   Ketones, ur NEGATIVE NEGATIVE mg/dL   Protein, ur NEGATIVE NEGATIVE mg/dL   Nitrite NEGATIVE NEGATIVE   Leukocytes, UA TRACE (A) NEGATIVE   RBC / HPF 0-5 0 - 5 RBC/hpf   WBC, UA 0-5 0 - 5 WBC/hpf   Bacteria, UA NONE SEEN NONE SEEN   Squamous Epithelial / LPF 6-30 (A) NONE SEEN   Mucus PRESENT   Urine rapid drug screen (hosp performed)     Status: None   Collection Time: 07/14/17  3:00 PM  Result Value Ref Range   Opiates NONE DETECTED NONE DETECTED   Cocaine NONE DETECTED NONE DETECTED   Benzodiazepines NONE DETECTED NONE DETECTED   Amphetamines NONE DETECTED NONE DETECTED   Tetrahydrocannabinol NONE DETECTED NONE DETECTED   Barbiturates NONE DETECTED NONE DETECTED    MDM UA UDS CBC and CMP Suspect hypoglycemia vs. Hypotension due to presentation of complaint, but unable to evaluate today. Encouraged patient to come to be seen as soon as episodes occur.  Assessment and Plan   1. Physically well but worried   2. Presence of fetal heart  sounds in first trimester    -Discharge home in stable condition -Seizure precautions discussed -Patient advised to follow-up with [facility] as scheduled for prenatal care -Patient may return to MAU as needed or if her condition were to change or worsen   Wende Mott CNM 07/14/2017, 3:34 PM

## 2017-07-14 NOTE — Discharge Instructions (Signed)
Near-Syncope °Near-syncope is when you suddenly get weak or dizzy, or you feel like you might pass out (faint). During an episode of near-syncope, you may: °· Feel dizzy or light-headed. °· Feel sick to your stomach (nauseous). °· See all white or all black. °· Have cold, clammy skin. ° °If you passed out, get help right away.Call your local emergency services (911 in the [place] Do not drive yourself to the hospital. °Follow these instructions at home: °Pay attention to any changes in your symptoms. Take these actions to help with your condition: °· Have someone stay with you until you feel stable. °· Do not drive, use machinery, or play sports until your doctor says it is okay. °· Keep all follow-up visits as told by your doctor. This is important. °· If you start to feel like you might pass out, lie down right away and raise (elevate) your feet above the level of your heart. Breathe deeply and steadily. Wait until all of the symptoms are gone. °· Drink enough fluid to keep your pee (urine) clear or pale yellow. °· If you are taking blood pressure or heart medicine, get up slowly and spend many minutes getting ready to sit and then stand. This can help with dizziness. °· Take over-the-counter and prescription medicines only as told by your doctor. ° °Get help right away if: °· You have a very bad headache. °· You have unusual pain in your chest, tummy, or back. °· You are bleeding from your mouth or rectum. °· You have black or tarry poop (stool). °· You have a very fast or uneven heartbeat (palpitations). °· You pass out one time or more than once. °· You have jerky movements that you cannot control (seizure). °· You are confused. °· You have trouble walking. °· You are very weak. °· You have vision problems. °These symptoms may be an emergency. Do not wait to see if the symptoms will go away. Get medical help right away. Call your local emergency services (911 in the [place] Do not drive yourself to the  hospital. °This information is not intended to replace advice given to you by your health care provider. Make sure you discuss any questions you have with your health care provider. °Document Released: 01/17/2008 Document Revised: 01/06/2016 Document Reviewed: 04/14/2015 °Elsevier Interactive Patient Education © 2017 [employer] ° °

## 2017-07-14 NOTE — MAU Note (Signed)
Patient states "Thursday evening I had a seizure and fainted, the chair I was holding on to fell and hit me on the left side."  Patient now c/o left side of her head hurting.  She states she is having a "stinging" pain in her abdomen.

## 2017-07-24 ENCOUNTER — Encounter: Payer: Self-pay | Admitting: Women's Health

## 2017-07-24 ENCOUNTER — Ambulatory Visit (INDEPENDENT_AMBULATORY_CARE_PROVIDER_SITE_OTHER): Payer: Medicaid Other | Admitting: Women's Health

## 2017-07-24 ENCOUNTER — Ambulatory Visit (INDEPENDENT_AMBULATORY_CARE_PROVIDER_SITE_OTHER): Payer: Medicaid Other

## 2017-07-24 VITALS — BP 118/70 | HR 83 | Wt 189.0 lb

## 2017-07-24 DIAGNOSIS — Z3682 Encounter for antenatal screening for nuchal translucency: Secondary | ICD-10-CM

## 2017-07-24 DIAGNOSIS — Z3481 Encounter for supervision of other normal pregnancy, first trimester: Secondary | ICD-10-CM

## 2017-07-24 DIAGNOSIS — D259 Leiomyoma of uterus, unspecified: Secondary | ICD-10-CM | POA: Insufficient documentation

## 2017-07-24 DIAGNOSIS — R569 Unspecified convulsions: Secondary | ICD-10-CM

## 2017-07-24 DIAGNOSIS — Z331 Pregnant state, incidental: Secondary | ICD-10-CM

## 2017-07-24 DIAGNOSIS — O09891 Supervision of other high risk pregnancies, first trimester: Secondary | ICD-10-CM

## 2017-07-24 DIAGNOSIS — O3411 Maternal care for benign tumor of corpus uteri, first trimester: Secondary | ICD-10-CM

## 2017-07-24 DIAGNOSIS — Z3A13 13 weeks gestation of pregnancy: Secondary | ICD-10-CM

## 2017-07-24 DIAGNOSIS — Z1389 Encounter for screening for other disorder: Secondary | ICD-10-CM

## 2017-07-24 DIAGNOSIS — Z3401 Encounter for supervision of normal first pregnancy, first trimester: Secondary | ICD-10-CM

## 2017-07-24 LAB — POCT URINALYSIS DIPSTICK
Blood, UA: NEGATIVE
GLUCOSE UA: NEGATIVE
KETONES UA: NEGATIVE
Leukocytes, UA: NEGATIVE
NITRITE UA: NEGATIVE
PROTEIN UA: NEGATIVE

## 2017-07-24 NOTE — Patient Instructions (Signed)
Joan Andrews, I greatly value your feedback.  If you receive a survey following your visit with [place] today, we appreciate you taking the time to fill it out.  Thanks, Knute Neu, CNM, WHNP-BC   Second Trimester of Pregnancy The second trimester is from week 14 through week 27 (months 4 through 6). The second trimester is often a time when you feel your best. Your body has adjusted to being pregnant, and you begin to feel better physically. Usually, morning sickness has lessened or quit completely, you may have more energy, and you may have an increase in appetite. The second trimester is also a time when the fetus is growing rapidly. At the end of the sixth month, the fetus is about 9 inches long and weighs about 1 pounds. You will likely begin to feel the baby move (quickening) between 16 and 20 weeks of pregnancy. Body changes during your second trimester Your body continues to go through many changes during your second trimester. The changes vary from woman to woman.  Your weight will continue to increase. You will notice your lower abdomen bulging out.  You may begin to get stretch marks on your hips, abdomen, and breasts.  You may develop headaches that can be relieved by medicines. The medicines should be approved by your health care provider.  You may urinate more often because the fetus is pressing on your bladder.  You may develop or continue to have heartburn as a result of your pregnancy.  You may develop constipation because certain hormones are causing the muscles that push waste through your intestines to slow down.  You may develop hemorrhoids or swollen, bulging veins (varicose veins).  You may have back pain. This is caused by: ? Weight gain. ? Pregnancy hormones that are relaxing the joints in your pelvis. ? A shift in weight and the muscles that support your balance.  Your breasts will continue to grow and they will continue to become tender.  Your gums may  bleed and may be sensitive to brushing and flossing.  Dark spots or blotches (chloasma, mask of pregnancy) may develop on your face. This will likely fade after the baby is born.  A dark line from your belly button to the pubic area (linea nigra) may appear. This will likely fade after the baby is born.  You may have changes in your hair. These can include thickening of your hair, rapid growth, and changes in texture. Some women also have hair loss during or after pregnancy, or hair that feels dry or thin. Your hair will most likely return to normal after your baby is born.  What to expect at prenatal visits During a routine prenatal visit:  You will be weighed to make sure you and the fetus are growing normally.  Your blood pressure will be taken.  Your abdomen will be measured to track your baby's growth.  The fetal heartbeat will be listened to.  Any test results from the previous visit will be discussed.  Your health care provider may ask you:  How you are feeling.  If you are feeling the baby move.  If you have had any abnormal symptoms, such as leaking fluid, bleeding, severe headaches, or abdominal cramping.  If you are using any tobacco products, including cigarettes, chewing tobacco, and electronic cigarettes.  If you have any questions.  Other tests that may be performed during your second trimester include:  Blood tests that check for: ? Low iron levels (anemia). ? High  blood sugar that affects pregnant women (gestational diabetes) between 42 and 28 weeks. ? Rh antibodies. This is to check for a protein on red blood cells (Rh factor).  Urine tests to check for infections, diabetes, or protein in the urine.  An ultrasound to confirm the proper growth and development of the baby.  An amniocentesis to check for possible genetic problems.  Fetal screens for spina bifida and Down syndrome.  HIV (human immunodeficiency virus) testing. Routine prenatal testing  includes screening for HIV, unless you choose not to have this test.  Follow these instructions at home: Medicines  Follow your health care provider's instructions regarding medicine use. Specific medicines may be either safe or unsafe to take during pregnancy.  Take a prenatal vitamin that contains at least 600 micrograms (mcg) of folic acid.  If you develop constipation, try taking a stool softener if your health care provider approves. Eating and drinking  Eat a balanced diet that includes fresh fruits and vegetables, whole grains, good sources of protein such as meat, eggs, or tofu, and low-fat dairy. Your health care provider will help you determine the amount of weight gain that is right for you.  Avoid raw meat and uncooked cheese. These carry germs that can cause birth defects in the baby.  If you have low calcium intake from food, talk to your health care provider about whether you should take a daily calcium supplement.  Limit foods that are high in fat and processed sugars, such as fried and sweet foods.  To prevent constipation: ? Drink enough fluid to keep your urine clear or pale yellow. ? Eat foods that are high in fiber, such as fresh fruits and vegetables, whole grains, and beans. Activity  Exercise only as directed by your health care provider. Most women can continue their usual exercise routine during pregnancy. Try to exercise for 30 minutes at least 5 days a week. Stop exercising if you experience uterine contractions.  Avoid heavy lifting, wear low heel shoes, and practice good posture.  A sexual relationship may be continued unless your health care provider directs you otherwise. Relieving pain and discomfort  Wear a good support bra to prevent discomfort from breast tenderness.  Take warm sitz baths to soothe any pain or discomfort caused by hemorrhoids. Use hemorrhoid cream if your health care provider approves.  Rest with your legs elevated if you have  leg cramps or low back pain.  If you develop varicose veins, wear support hose. Elevate your feet for 15 minutes, 3-4 times a day. Limit salt in your diet. Prenatal Care  Write down your questions. Take them to your prenatal visits.  Keep all your prenatal visits as told by your health care provider. This is important. Safety  Wear your seat belt at all times when driving.  Make a list of emergency phone numbers, including numbers for family, friends, the hospital, and police and fire departments. General instructions  Ask your health care provider for a referral to a local prenatal education class. Begin classes no later than the beginning of month 6 of your pregnancy.  Ask for help if you have counseling or nutritional needs during pregnancy. Your health care provider can offer advice or refer you to specialists for help with various needs.  Do not use hot tubs, steam rooms, or saunas.  Do not douche or use tampons or scented sanitary pads.  Do not cross your legs for long periods of time.  Avoid cat litter boxes and soil  used by cats. These carry germs that can cause birth defects in the baby and possibly loss of the fetus by miscarriage or stillbirth.  Avoid all smoking, herbs, alcohol, and unprescribed drugs. Chemicals in these products can affect the formation and growth of the baby.  Do not use any products that contain nicotine or tobacco, such as cigarettes and e-cigarettes. If you need help quitting, ask your health care provider.  Visit your dentist if you have not gone yet during your pregnancy. Use a soft toothbrush to brush your teeth and be gentle when you floss. Contact a health care provider if:  You have dizziness.  You have mild pelvic cramps, pelvic pressure, or nagging pain in the abdominal area.  You have persistent nausea, vomiting, or diarrhea.  You have a bad smelling vaginal discharge.  You have pain when you urinate. Get help right away if:  You  have a fever.  You are leaking fluid from your vagina.  You have spotting or bleeding from your vagina.  You have severe abdominal cramping or pain.  You have rapid weight gain or weight loss.  You have shortness of breath with chest pain.  You notice sudden or extreme swelling of your face, hands, ankles, feet, or legs.  You have not felt your baby move in over an hour.  You have severe headaches that do not go away when you take medicine.  You have vision changes. Summary  The second trimester is from week 14 through week 27 (months 4 through 6). It is also a time when the fetus is growing rapidly.  Your body goes through many changes during pregnancy. The changes vary from woman to woman.  Avoid all smoking, herbs, alcohol, and unprescribed drugs. These chemicals affect the formation and growth your baby.  Do not use any tobacco products, such as cigarettes, chewing tobacco, and e-cigarettes. If you need help quitting, ask your health care provider.  Contact your health care provider if you have any questions. Keep all prenatal visits as told by your health care provider. This is important. This information is not intended to replace advice given to you by your health care provider. Make sure you discuss any questions you have with your health care provider. Document Released: 07/25/2001 Document Revised: 01/06/2016 Document Reviewed: 10/01/2012 Elsevier Interactive Patient Education  2017 [employer].

## 2017-07-24 NOTE — Progress Notes (Signed)
[place] 13+2 wks,measurements c/w dates,normal ovaries bilat,anterior uterine contraction vs fibroid 3.5 x 3 x  4.5 cm,NB present,NT 1.8 mm,fhr 152 bpm

## 2017-07-24 NOTE — Progress Notes (Signed)
LOW-RISK PREGNANCY VISIT Patient name: Joan Andrews MRN [id number]  Date of birth: 02-Jun-1996 Chief Complaint:   Routine Prenatal Visit (1st IT; ? seizures )  History of Present Illness:   Joan Andrews is a 21 y.o. G90P1011 female at [redacted]w[redacted]d with an Estimated Date of Delivery: 01/27/18 being seen today for ongoing management of a low-risk pregnancy.  Today she reports has had 2 seizure-like episodes since beginning of pregnancy. No h/o seizures/head injury, etc. 1st episode ~24mths ago, was very brief ~30sec, was told eyes rolled back, slumped over and began to shake- pt didn't recall incident. Went to PCP who said may be 'mini-seizure'. Then happened a few weeks ago while at beach, lasted ~6min, was told eyes rolled back and began to shake. Pt doesn't recall this incident either. Does report left-sided head pain on almost daily basis since these episodes began. .  Krista Blue. Bleeding: None.  Movement: Absent. denies leaking of fluid. Review of Systems:   Pertinent items are noted in HPI Denies abnormal vaginal discharge w/ itching/odor/irritation, headaches, visual changes, shortness of breath, chest pain, abdominal pain, severe nausea/vomiting, or problems with urination or bowel movements unless otherwise stated above. Pertinent History Reviewed:  Reviewed past medical,surgical, social, obstetrical and family history.  Reviewed problem list, medications and allergies. Physical Assessment:   Vitals:   07/24/17 0914  BP: 118/70  Pulse: 83  Weight: 189 lb (85.7 kg)  Body mass index is 31.45 kg/m.        Physical Examination:   General appearance: Well appearing, and in no distress  Mental status: Alert, oriented to person, place, and time  Skin: Warm & dry  Cardiovascular: Normal heart rate noted  Respiratory: Normal respiratory effort, no distress  Abdomen: Soft, gravid, nontender  Pelvic: Cervical exam deferred         Extremities: Edema: None  Fetal Status: Fetal  Heart Rate (bpm): 152 u/s   Movement: Absent    NT u/s: [place] 13+2 wks,measurements c/w dates,normal ovaries bilat,anterior uterine contraction vs fibroid 3.5 x 3 x  4.5 cm,NB present,NT 1.8 mm,fhr 152 bpm  Results for orders placed or performed in visit on 07/24/17 (from the past 24 hour(s))  POCT urinalysis dipstick   Collection Time: 07/24/17  9:55 AM  Result Value Ref Range   Color, UA     Clarity, UA     Glucose, UA neg    Bilirubin, UA     Ketones, UA neg    Spec Grav, UA  1.010 - 1.025   Blood, UA neg    pH, UA  5.0 - 8.0   Protein, UA neg    Urobilinogen, UA  0.2 or 1.0 E.U./dL   Nitrite, UA neg    Leukocytes, UA Negative Negative   Appearance     Odor      Assessment & Plan:  1) Low-risk pregnancy G3P1011 at [redacted]w[redacted]d with an Estimated Date of Delivery: 01/27/18   2) 2 episodes of seizure-like activity w/ Lt sided head pain, discussed w/ LHE, refer to Dr. Merlene Laughter. Per the office manager at [facility] [phone] the referral has to come from PCP d/t pt having [place]. Message sent to Dr. Buelah Manis via [place]. Will fax my notes from today. Pt to let us know by end of week if she hasn't heard from anyone.  Labs/procedures today: 1st IT/NT  Plan:  Continue routine obstetrical care   Reviewed: Preterm labor symptoms and general obstetric precautions including but not limited to vaginal  bleeding, contractions, leaking of fluid and fetal movement were reviewed in detail with the patient.  All questions were answered.  Follow-up: Return in about 3 weeks (around 08/14/2017) for [place] w/ LHE, 2nd IT.  Orders Placed This Encounter  Procedures  . Integrated 1  . POCT urinalysis dipstick   Tawnya Crook CNM, [facility] 07/24/2017 10:22 AM

## 2017-07-25 ENCOUNTER — Other Ambulatory Visit: Payer: Self-pay | Admitting: Family Medicine

## 2017-07-25 DIAGNOSIS — G40909 Epilepsy, unspecified, not intractable, without status epilepticus: Secondary | ICD-10-CM

## 2017-07-25 NOTE — Progress Notes (Signed)
Referral placed Per GYN, she had 2 other "seizure" like episodes reported THough I dont have any ER notes besides the one in Sept

## 2017-07-30 ENCOUNTER — Encounter: Payer: Self-pay | Admitting: *Deleted

## 2017-07-30 ENCOUNTER — Telehealth: Payer: Self-pay | Admitting: Women's Health

## 2017-07-30 NOTE — Telephone Encounter (Signed)
Patient states that she would like a call back from [place], pt states she was going to be referred to a neurologist and she still hasn't heard from them. Please contact pt

## 2017-07-30 NOTE — Telephone Encounter (Signed)
Phone busy. Will send mychart message.

## 2017-08-06 LAB — INTEGRATED 1
CROWN RUMP LENGTH MAT SCREEN: 80.6 mm
Gest. Age on Collection Date: 13.3 weeks
MATERNAL AGE AT EDD: 23.5 a
NUMBER OF FETUSES: 1
Nuchal Translucency (NT): 1.8 mm
PAPP-A Value: 1355.1 ng/mL
Weight: 189 [lb_av]

## 2017-08-14 NOTE — L&D Delivery Note (Signed)
Delivery Note Joan Andrews is a 22 yo G3 now P2012 s/p SVD at [redacted]w[redacted]d after presenting in spontaneous labor. She presented at 5 cm dilation and around 0615 felt the urge to push, was found to have a bulging bag of water and AROM with clear fluid at 0615, and quickly became complete.  At 6:29 AM a viable female was delivered via Vaginal, Spontaneous (Presentation: ROA).  APGAR: 8, 9; weight  pending.   Immediately after delivery attention was turned to active management of the third stage of labor with gentle traction via Brandt maneuver. Placenta status: delivered spontaneously, intact, 3-vessel cord.    Anesthesia:  None Episiotomy: None Lacerations: 2nd degree;Perineal Suture Repair: 3.0 monocryl Est. Blood Loss (mL):  250 mL  Mom to postpartum.  Baby to Couplet care / Skin to Skin.  Joan Andrews 01/22/2018, 7:05 AM

## 2017-08-15 ENCOUNTER — Ambulatory Visit (INDEPENDENT_AMBULATORY_CARE_PROVIDER_SITE_OTHER): Payer: Medicaid Other | Admitting: Obstetrics & Gynecology

## 2017-08-15 ENCOUNTER — Encounter: Payer: Self-pay | Admitting: Obstetrics & Gynecology

## 2017-08-15 VITALS — BP 90/60 | HR 92 | Wt 191.0 lb

## 2017-08-15 DIAGNOSIS — Z1389 Encounter for screening for other disorder: Secondary | ICD-10-CM

## 2017-08-15 DIAGNOSIS — Z3A16 16 weeks gestation of pregnancy: Secondary | ICD-10-CM

## 2017-08-15 DIAGNOSIS — Z1379 Encounter for other screening for genetic and chromosomal anomalies: Secondary | ICD-10-CM

## 2017-08-15 DIAGNOSIS — Z331 Pregnant state, incidental: Secondary | ICD-10-CM

## 2017-08-15 DIAGNOSIS — Z3402 Encounter for supervision of normal first pregnancy, second trimester: Secondary | ICD-10-CM

## 2017-08-15 DIAGNOSIS — Z3482 Encounter for supervision of other normal pregnancy, second trimester: Secondary | ICD-10-CM

## 2017-08-15 LAB — POCT URINALYSIS DIPSTICK
Glucose, UA: NEGATIVE
Ketones, UA: NEGATIVE
LEUKOCYTES UA: NEGATIVE
NITRITE UA: NEGATIVE
RBC UA: NEGATIVE

## 2017-08-15 NOTE — Progress Notes (Signed)
[id number] [redacted]w[redacted]d Estimated Date of Delivery: 01/27/18  Blood pressure 90/60, pulse 92, weight 191 lb (86.6 kg), last menstrual period 04/16/2017, not currently breastfeeding.   BP weight and urine results all reviewed and noted.  Please refer to the obstetrical flow sheet for the fundal height and fetal heart rate documentation:  Patient reports good fetal movement, denies any bleeding and no rupture of membranes symptoms or regular contractions. Patient is without complaints. All questions were answered.  Orders Placed This Encounter  Procedures  . US OB Comp + 14 Wk  . INTEGRATED 2  . POCT urinalysis dipstick    Plan:  Continued routine obstetrical care, 2nd IT today  Return in about 3 weeks (around 09/05/2017) for 20 week sono, LROB.

## 2017-08-25 LAB — INTEGRATED 2
AFP MARKER: 38.8 ng/mL
AFP MoM: 1.44
Crown Rump Length: 80.6 mm
DIA MOM: 1.31
DIA Value: 192.6 pg/mL
ESTRIOL UNCONJUGATED: 1.15 ng/mL
GEST. AGE ON COLLECTION DATE: 13.3 wk
GESTATIONAL AGE: 16.4 wk
HCG MOM: 3.23
Maternal Age at EDD: 23.5 yr
NUCHAL TRANSLUCENCY MOM: 0.92
Nuchal Translucency (NT): 1.8 mm
Number of Fetuses: 1
PAPP-A MoM: 1.37
PAPP-A Value: 1355.1 ng/mL
Test Results:: NEGATIVE
Weight: 189 [lb_av]
Weight: 189 [lb_av]
hCG Value: 86.3 IU/mL
uE3 MoM: 1.42

## 2017-08-28 ENCOUNTER — Encounter: Payer: Self-pay | Admitting: Family Medicine

## 2017-09-04 ENCOUNTER — Other Ambulatory Visit: Payer: Self-pay | Admitting: Obstetrics & Gynecology

## 2017-09-04 DIAGNOSIS — Z3402 Encounter for supervision of normal first pregnancy, second trimester: Secondary | ICD-10-CM

## 2017-09-04 DIAGNOSIS — Z363 Encounter for antenatal screening for malformations: Secondary | ICD-10-CM

## 2017-09-05 ENCOUNTER — Encounter: Payer: Self-pay | Admitting: Advanced Practice Midwife

## 2017-09-05 ENCOUNTER — Ambulatory Visit (INDEPENDENT_AMBULATORY_CARE_PROVIDER_SITE_OTHER): Payer: Medicaid Other

## 2017-09-05 ENCOUNTER — Ambulatory Visit (INDEPENDENT_AMBULATORY_CARE_PROVIDER_SITE_OTHER): Payer: Medicaid Other | Admitting: Advanced Practice Midwife

## 2017-09-05 VITALS — BP 100/80 | HR 95 | Wt 196.0 lb

## 2017-09-05 DIAGNOSIS — Z3402 Encounter for supervision of normal first pregnancy, second trimester: Secondary | ICD-10-CM

## 2017-09-05 DIAGNOSIS — Z331 Pregnant state, incidental: Secondary | ICD-10-CM

## 2017-09-05 DIAGNOSIS — Z1389 Encounter for screening for other disorder: Secondary | ICD-10-CM

## 2017-09-05 DIAGNOSIS — Z363 Encounter for antenatal screening for malformations: Secondary | ICD-10-CM | POA: Diagnosis not present

## 2017-09-05 DIAGNOSIS — Z3A19 19 weeks gestation of pregnancy: Secondary | ICD-10-CM

## 2017-09-05 DIAGNOSIS — Z3482 Encounter for supervision of other normal pregnancy, second trimester: Secondary | ICD-10-CM

## 2017-09-05 LAB — POCT URINALYSIS DIPSTICK
Blood, UA: NEGATIVE
GLUCOSE UA: NEGATIVE
Ketones, UA: NEGATIVE
LEUKOCYTES UA: NEGATIVE
NITRITE UA: NEGATIVE
PROTEIN UA: 1

## 2017-09-05 NOTE — Patient Instructions (Signed)
Joan Andrews, I greatly value your feedback.  If you receive a survey following your visit with [place] today, we appreciate you taking the time to fill it out.  Thanks, Nigel Berthold, CNM     Second Trimester of Pregnancy The second trimester is from week 14 through week 27 (months 4 through 6). The second trimester is often a time when you feel your best. Your body has adjusted to being pregnant, and you begin to feel better physically. Usually, morning sickness has lessened or quit completely, you may have more energy, and you may have an increase in appetite. The second trimester is also a time when the fetus is growing rapidly. At the end of the sixth month, the fetus is about 9 inches long and weighs about 1 pounds. You will likely begin to feel the baby move (quickening) between 16 and 20 weeks of pregnancy. Body changes during your second trimester Your body continues to go through many changes during your second trimester. The changes vary from woman to woman.  Your weight will continue to increase. You will notice your lower abdomen bulging out.  You may begin to get stretch marks on your hips, abdomen, and breasts.  You may develop headaches that can be relieved by medicines. The medicines should be approved by your health care provider.  You may urinate more often because the fetus is pressing on your bladder.  You may develop or continue to have heartburn as a result of your pregnancy.  You may develop constipation because certain hormones are causing the muscles that push waste through your intestines to slow down.  You may develop hemorrhoids or swollen, bulging veins (varicose veins).  You may have back pain. This is caused by: ? Weight gain. ? Pregnancy hormones that are relaxing the joints in your pelvis. ? A shift in weight and the muscles that support your balance.  Your breasts will continue to grow and they will continue to become tender.  Your gums  may bleed and may be sensitive to brushing and flossing.  Dark spots or blotches (chloasma, mask of pregnancy) may develop on your face. This will likely fade after the baby is born.  A dark line from your belly button to the pubic area (linea nigra) may appear. This will likely fade after the baby is born.  You may have changes in your hair. These can include thickening of your hair, rapid growth, and changes in texture. Some women also have hair loss during or after pregnancy, or hair that feels dry or thin. Your hair will most likely return to normal after your baby is born.  What to expect at prenatal visits During a routine prenatal visit:  You will be weighed to make sure you and the fetus are growing normally.  Your blood pressure will be taken.  Your abdomen will be measured to track your baby's growth.  The fetal heartbeat will be listened to.  Any test results from the previous visit will be discussed.  Your health care provider may ask you:  How you are feeling.  If you are feeling the baby move.  If you have had any abnormal symptoms, such as leaking fluid, bleeding, severe headaches, or abdominal cramping.  If you are using any tobacco products, including cigarettes, chewing tobacco, and electronic cigarettes.  If you have any questions.  Other tests that may be performed during your second trimester include:  Blood tests that check for: ? Low iron levels (anemia). ?  High blood sugar that affects pregnant women (gestational diabetes) between 20 and 28 weeks. ? Rh antibodies. This is to check for a protein on red blood cells (Rh factor).  Urine tests to check for infections, diabetes, or protein in the urine.  An ultrasound to confirm the proper growth and development of the baby.  An amniocentesis to check for possible genetic problems.  Fetal screens for spina bifida and Down syndrome.  HIV (human immunodeficiency virus) testing. Routine prenatal testing  includes screening for HIV, unless you choose not to have this test.  Follow these instructions at home: Medicines  Follow your health care provider's instructions regarding medicine use. Specific medicines may be either safe or unsafe to take during pregnancy.  Take a prenatal vitamin that contains at least 600 micrograms (mcg) of folic acid.  If you develop constipation, try taking a stool softener if your health care provider approves. Eating and drinking  Eat a balanced diet that includes fresh fruits and vegetables, whole grains, good sources of protein such as meat, eggs, or tofu, and low-fat dairy. Your health care provider will help you determine the amount of weight gain that is right for you.  Avoid raw meat and uncooked cheese. These carry germs that can cause birth defects in the baby.  If you have low calcium intake from food, talk to your health care provider about whether you should take a daily calcium supplement.  Limit foods that are high in fat and processed sugars, such as fried and sweet foods.  To prevent constipation: ? Drink enough fluid to keep your urine clear or pale yellow. ? Eat foods that are high in fiber, such as fresh fruits and vegetables, whole grains, and beans. Activity  Exercise only as directed by your health care provider. Most women can continue their usual exercise routine during pregnancy. Try to exercise for 30 minutes at least 5 days a week. Stop exercising if you experience uterine contractions.  Avoid heavy lifting, wear low heel shoes, and practice good posture.  A sexual relationship may be continued unless your health care provider directs you otherwise. Relieving pain and discomfort  Wear a good support bra to prevent discomfort from breast tenderness.  Take warm sitz baths to soothe any pain or discomfort caused by hemorrhoids. Use hemorrhoid cream if your health care provider approves.  Rest with your legs elevated if you have  leg cramps or low back pain.  If you develop varicose veins, wear support hose. Elevate your feet for 15 minutes, 3-4 times a day. Limit salt in your diet. Prenatal Care  Write down your questions. Take them to your prenatal visits.  Keep all your prenatal visits as told by your health care provider. This is important. Safety  Wear your seat belt at all times when driving.  Make a list of emergency phone numbers, including numbers for family, friends, the hospital, and police and fire departments. General instructions  Ask your health care provider for a referral to a local prenatal education class. Begin classes no later than the beginning of month 6 of your pregnancy.  Ask for help if you have counseling or nutritional needs during pregnancy. Your health care provider can offer advice or refer you to specialists for help with various needs.  Do not use hot tubs, steam rooms, or saunas.  Do not douche or use tampons or scented sanitary pads.  Do not cross your legs for long periods of time.  Avoid cat litter boxes and  soil used by cats. These carry germs that can cause birth defects in the baby and possibly loss of the fetus by miscarriage or stillbirth.  Avoid all smoking, herbs, alcohol, and unprescribed drugs. Chemicals in these products can affect the formation and growth of the baby.  Do not use any products that contain nicotine or tobacco, such as cigarettes and e-cigarettes. If you need help quitting, ask your health care provider.  Visit your dentist if you have not gone yet during your pregnancy. Use a soft toothbrush to brush your teeth and be gentle when you floss. Contact a health care provider if:  You have dizziness.  You have mild pelvic cramps, pelvic pressure, or nagging pain in the abdominal area.  You have persistent nausea, vomiting, or diarrhea.  You have a bad smelling vaginal discharge.  You have pain when you urinate. Get help right away if:  You  have a fever.  You are leaking fluid from your vagina.  You have spotting or bleeding from your vagina.  You have severe abdominal cramping or pain.  You have rapid weight gain or weight loss.  You have shortness of breath with chest pain.  You notice sudden or extreme swelling of your face, hands, ankles, feet, or legs.  You have not felt your baby move in over an hour.  You have severe headaches that do not go away when you take medicine.  You have vision changes. Summary  The second trimester is from week 14 through week 27 (months 4 through 6). It is also a time when the fetus is growing rapidly.  Your body goes through many changes during pregnancy. The changes vary from woman to woman.  Avoid all smoking, herbs, alcohol, and unprescribed drugs. These chemicals affect the formation and growth your baby.  Do not use any tobacco products, such as cigarettes, chewing tobacco, and e-cigarettes. If you need help quitting, ask your health care provider.  Contact your health care provider if you have any questions. Keep all prenatal visits as told by your health care provider. This is important. This information is not intended to replace advice given to you by your health care provider. Make sure you discuss any questions you have with your health care provider.      CHILDBIRTH CLASSES [phone] is the phone number for Pregnancy Classes or hospital tours at [place] will be referred to  [email or web address] for more information on childbirth classes  At this site you may register for classes. You may sign up for a waiting list if classes are full. Please SIGN UP FOR THIS!.   When the waiting list becomes long, sometimes new classes can be added.

## 2017-09-05 NOTE — Progress Notes (Signed)
[id number] [redacted]w[redacted]d Estimated Date of Delivery: 01/27/18  Last menstrual period 04/16/2017, not currently breastfeeding.   BP weight and urine results all reviewed and noted.  Please refer to the obstetrical flow sheet for the fundal height and fetal heart rate documentation:  [place] 51+1 wks,cephalic,cx 3.2 cm,svp of fluid 5 cm,fhr 150 bpm,normal ovaries bilat,posterior pl gr 0,efw 302 g,anatomy complete,no obvious abnormalities  Had EEG w/ [place], has an appt on 2/17 to get results.  Patient reports good fetal movement, denies any bleeding and no rupture of membranes symptoms or regular contractions. Patient is without complaints. Denies UTI sx All questions were answered.  No orders of the defined types were placed in this encounter.   Plan:  Continued routine obstetrical care,   Return in about 4 weeks (around 10/03/2017) for [place].

## 2017-09-05 NOTE — Progress Notes (Signed)
[place] 32+9 wks,cephalic,cx 3.2 cm,svp of fluid 5 cm,fhr 150 bpm,normal ovaries bilat,posterior pl gr 0,efw 302 g,anatomy complete,no obvious abnormalities

## 2017-09-06 ENCOUNTER — Other Ambulatory Visit (HOSPITAL_COMMUNITY): Payer: Self-pay | Admitting: Neurology

## 2017-09-06 DIAGNOSIS — R569 Unspecified convulsions: Secondary | ICD-10-CM

## 2017-09-12 ENCOUNTER — Ambulatory Visit (HOSPITAL_COMMUNITY)
Admission: RE | Admit: 2017-09-12 | Discharge: 2017-09-12 | Disposition: A | Discharge status: Home / Self Care | Payer: Medicaid Other | Source: Ambulatory Visit | Attending: Neurology | Admitting: Neurology

## 2017-09-12 ENCOUNTER — Other Ambulatory Visit (HOSPITAL_COMMUNITY): Payer: Self-pay | Admitting: Neurology

## 2017-09-12 DIAGNOSIS — J329 Chronic sinusitis, unspecified: Secondary | ICD-10-CM | POA: Insufficient documentation

## 2017-09-12 DIAGNOSIS — R569 Unspecified convulsions: Secondary | ICD-10-CM

## 2017-09-21 ENCOUNTER — Encounter: Payer: Self-pay | Admitting: Women's Health

## 2017-09-25 ENCOUNTER — Encounter: Payer: Self-pay | Admitting: Family Medicine

## 2017-10-03 ENCOUNTER — Ambulatory Visit (INDEPENDENT_AMBULATORY_CARE_PROVIDER_SITE_OTHER): Payer: Medicaid Other | Admitting: Women's Health

## 2017-10-03 ENCOUNTER — Encounter: Payer: Self-pay | Admitting: Women's Health

## 2017-10-03 VITALS — BP 100/60 | HR 100 | Wt 199.0 lb

## 2017-10-03 DIAGNOSIS — R569 Unspecified convulsions: Secondary | ICD-10-CM

## 2017-10-03 DIAGNOSIS — Z3A23 23 weeks gestation of pregnancy: Secondary | ICD-10-CM

## 2017-10-03 DIAGNOSIS — Z1389 Encounter for screening for other disorder: Secondary | ICD-10-CM

## 2017-10-03 DIAGNOSIS — Z3482 Encounter for supervision of other normal pregnancy, second trimester: Secondary | ICD-10-CM

## 2017-10-03 DIAGNOSIS — Z331 Pregnant state, incidental: Secondary | ICD-10-CM

## 2017-10-03 LAB — POCT URINALYSIS DIPSTICK
Blood, UA: NEGATIVE
Glucose, UA: NEGATIVE
KETONES UA: NEGATIVE
Leukocytes, UA: NEGATIVE
NITRITE UA: NEGATIVE

## 2017-10-03 NOTE — Progress Notes (Signed)
   LOW-RISK PREGNANCY VISIT Patient name: Joan Andrews MRN [id number]  Date of birth: 08-01-1996 Chief Complaint:   Routine Prenatal Visit  History of Present Illness:   Joan Andrews is a 22 y.o. G44P1011 female at [redacted]w[redacted]d with an Estimated Date of Delivery: 01/27/18 being seen today for ongoing management of a low-risk pregnancy.  Today she reports no complaints and some LBP. Went to Dr. Merlene Laughter, had EEG and brain MRI 'all normal', states she is unclear if she has seizures or not or if she is supposed to be taking seizure meds or not- wasn't told at last visit w/ neurologist. Has not been taking seizure meds. Last seizure like activity was 'awhile ago'. Contractions: Not present. Vag. Bleeding: None.  Movement: Present. denies leaking of fluid. Review of Systems:   Pertinent items are noted in HPI Denies abnormal vaginal discharge w/ itching/odor/irritation, headaches, visual changes, shortness of breath, chest pain, abdominal pain, severe nausea/vomiting, or problems with urination or bowel movements unless otherwise stated above. Pertinent History Reviewed:  Reviewed past medical,surgical, social, obstetrical and family history.  Reviewed problem list, medications and allergies. Physical Assessment:   Vitals:   10/03/17 1537  BP: 100/60  Pulse: 100  Weight: 199 lb (90.3 kg)  Body mass index is 33.12 kg/m.        Physical Examination:   General appearance: Well appearing, and in no distress  Mental status: Alert, oriented to person, place, and time  Skin: Warm & dry  Cardiovascular: Normal heart rate noted  Respiratory: Normal respiratory effort, no distress  Abdomen: Soft, gravid, nontender  Pelvic: Cervical exam deferred         Extremities: Edema: None  Fetal Status: Fetal Heart Rate (bpm): 158 Fundal Height: 23 cm Movement: Present    Results for orders placed or performed in visit on 10/03/17 (from the past 24 hour(s))  POCT urinalysis dipstick   Collection  Time: 10/03/17  3:38 PM  Result Value Ref Range   Color, UA     Clarity, UA     Glucose, UA neg    Bilirubin, UA     Ketones, UA neg    Spec Grav, UA  1.010 - 1.025   Blood, UA neg    pH, UA  5.0 - 8.0   Protein, UA trace    Urobilinogen, UA  0.2 or 1.0 E.U./dL   Nitrite, UA neg    Leukocytes, UA Negative Negative   Appearance     Odor      Assessment & Plan:  1) Low-risk pregnancy G3P1011 at [redacted]w[redacted]d with an Estimated Date of Delivery: 01/27/18   2) ?Seizures, pt is unclear after last appt w/ neurologist if she does or does not have seizures, is not currently taking meds. Will request records. Pt to call neuro office for clarification as well.    Meds: No orders of the defined types were placed in this encounter.  Labs/procedures today: none  Plan:  Continue routine obstetrical care   Reviewed: Preterm labor symptoms and general obstetric precautions including but not limited to vaginal bleeding, contractions, leaking of fluid and fetal movement were reviewed in detail with the patient.  All questions were answered  Follow-up: Return in about 4 weeks (around 10/31/2017) for LROB, PN2; please sign release to get notes from Dr. Merlene Laughter.  Orders Placed This Encounter  Procedures  . POCT urinalysis dipstick   Roma Schanz CNM, [facility] 10/03/2017 4:10 PM

## 2017-10-03 NOTE — Patient Instructions (Addendum)
Joan Andrews, I greatly value your feedback.  If you receive a survey following your visit with [place] today, we appreciate you taking the time to fill it out.  Thanks, Knute Neu, CNM, WHNP-BC  For your lower back pain you may:  Purchase a pregnancy belt from [employer] [place], [employer], [employer], etc and wear it while you are up and about  Take warm baths  Use a heating pad to your lower back for no longer than 20 minutes at a time, and do not place near abdomen  Take tylenol as needed. Please follow directions on the bottle  Kinesthesiology tape (can get from sporting goods store), google how to tape belly for pregnancy     Second Trimester of Pregnancy The second trimester is from week 14 through week 27 (months 4 through 6). The second trimester is often a time when you feel your best. Your body has adjusted to being pregnant, and you begin to feel better physically. Usually, morning sickness has lessened or quit completely, you may have more energy, and you may have an increase in appetite. The second trimester is also a time when the fetus is growing rapidly. At the end of the sixth month, the fetus is about 9 inches long and weighs about 1 pounds. You will likely begin to feel the baby move (quickening) between 16 and 20 weeks of pregnancy. Body changes during your second trimester Your body continues to go through many changes during your second trimester. The changes vary from woman to woman.  Your weight will continue to increase. You will notice your lower abdomen bulging out.  You may begin to get stretch marks on your hips, abdomen, and breasts.  You may develop headaches that can be relieved by medicines. The medicines should be approved by your health care provider.  You may urinate more often because the fetus is pressing on your bladder.  You may develop or continue to have heartburn as a result of your pregnancy.  You may develop constipation because  certain hormones are causing the muscles that push waste through your intestines to slow down.  You may develop hemorrhoids or swollen, bulging veins (varicose veins).  You may have back pain. This is caused by: ? Weight gain. ? Pregnancy hormones that are relaxing the joints in your pelvis. ? A shift in weight and the muscles that support your balance.  Your breasts will continue to grow and they will continue to become tender.  Your gums may bleed and may be sensitive to brushing and flossing.  Dark spots or blotches (chloasma, mask of pregnancy) may develop on your face. This will likely fade after the baby is born.  A dark line from your belly button to the pubic area (linea nigra) may appear. This will likely fade after the baby is born.  You may have changes in your hair. These can include thickening of your hair, rapid growth, and changes in texture. Some women also have hair loss during or after pregnancy, or hair that feels dry or thin. Your hair will most likely return to normal after your baby is born.  What to expect at prenatal visits During a routine prenatal visit:  You will be weighed to make sure you and the fetus are growing normally.  Your blood pressure will be taken.  Your abdomen will be measured to track your baby's growth.  The fetal heartbeat will be listened to.  Any test results from the previous visit will  be discussed.  Your health care provider may ask you:  How you are feeling.  If you are feeling the baby move.  If you have had any abnormal symptoms, such as leaking fluid, bleeding, severe headaches, or abdominal cramping.  If you are using any tobacco products, including cigarettes, chewing tobacco, and electronic cigarettes.  If you have any questions.  Other tests that may be performed during your second trimester include:  Blood tests that check for: ? Low iron levels (anemia). ? High blood sugar that affects pregnant women  (gestational diabetes) between 12 and 28 weeks. ? Rh antibodies. This is to check for a protein on red blood cells (Rh factor).  Urine tests to check for infections, diabetes, or protein in the urine.  An ultrasound to confirm the proper growth and development of the baby.  An amniocentesis to check for possible genetic problems.  Fetal screens for spina bifida and Down syndrome.  HIV (human immunodeficiency virus) testing. Routine prenatal testing includes screening for HIV, unless you choose not to have this test.  Follow these instructions at home: Medicines  Follow your health care provider's instructions regarding medicine use. Specific medicines may be either safe or unsafe to take during pregnancy.  Take a prenatal vitamin that contains at least 600 micrograms (mcg) of folic acid.  If you develop constipation, try taking a stool softener if your health care provider approves. Eating and drinking  Eat a balanced diet that includes fresh fruits and vegetables, whole grains, good sources of protein such as meat, eggs, or tofu, and low-fat dairy. Your health care provider will help you determine the amount of weight gain that is right for you.  Avoid raw meat and uncooked cheese. These carry germs that can cause birth defects in the baby.  If you have low calcium intake from food, talk to your health care provider about whether you should take a daily calcium supplement.  Limit foods that are high in fat and processed sugars, such as fried and sweet foods.  To prevent constipation: ? Drink enough fluid to keep your urine clear or pale yellow. ? Eat foods that are high in fiber, such as fresh fruits and vegetables, whole grains, and beans. Activity  Exercise only as directed by your health care provider. Most women can continue their usual exercise routine during pregnancy. Try to exercise for 30 minutes at least 5 days a week. Stop exercising if you experience uterine  contractions.  Avoid heavy lifting, wear low heel shoes, and practice good posture.  A sexual relationship may be continued unless your health care provider directs you otherwise. Relieving pain and discomfort  Wear a good support bra to prevent discomfort from breast tenderness.  Take warm sitz baths to soothe any pain or discomfort caused by hemorrhoids. Use hemorrhoid cream if your health care provider approves.  Rest with your legs elevated if you have leg cramps or low back pain.  If you develop varicose veins, wear support hose. Elevate your feet for 15 minutes, 3-4 times a day. Limit salt in your diet. Prenatal Care  Write down your questions. Take them to your prenatal visits.  Keep all your prenatal visits as told by your health care provider. This is important. Safety  Wear your seat belt at all times when driving.  Make a list of emergency phone numbers, including numbers for family, friends, the hospital, and police and fire departments. General instructions  Ask your health care provider for a referral to  a local prenatal education class. Begin classes no later than the beginning of month 6 of your pregnancy.  Ask for help if you have counseling or nutritional needs during pregnancy. Your health care provider can offer advice or refer you to specialists for help with various needs.  Do not use hot tubs, steam rooms, or saunas.  Do not douche or use tampons or scented sanitary pads.  Do not cross your legs for long periods of time.  Avoid cat litter boxes and soil used by cats. These carry germs that can cause birth defects in the baby and possibly loss of the fetus by miscarriage or stillbirth.  Avoid all smoking, herbs, alcohol, and unprescribed drugs. Chemicals in these products can affect the formation and growth of the baby.  Do not use any products that contain nicotine or tobacco, such as cigarettes and e-cigarettes. If you need help quitting, ask your  health care provider.  Visit your dentist if you have not gone yet during your pregnancy. Use a soft toothbrush to brush your teeth and be gentle when you floss. Contact a health care provider if:  You have dizziness.  You have mild pelvic cramps, pelvic pressure, or nagging pain in the abdominal area.  You have persistent nausea, vomiting, or diarrhea.  You have a bad smelling vaginal discharge.  You have pain when you urinate. Get help right away if:  You have a fever.  You are leaking fluid from your vagina.  You have spotting or bleeding from your vagina.  You have severe abdominal cramping or pain.  You have rapid weight gain or weight loss.  You have shortness of breath with chest pain.  You notice sudden or extreme swelling of your face, hands, ankles, feet, or legs.  You have not felt your baby move in over an hour.  You have severe headaches that do not go away when you take medicine.  You have vision changes. Summary  The second trimester is from week 14 through week 27 (months 4 through 6). It is also a time when the fetus is growing rapidly.  Your body goes through many changes during pregnancy. The changes vary from woman to woman.  Avoid all smoking, herbs, alcohol, and unprescribed drugs. These chemicals affect the formation and growth your baby.  Do not use any tobacco products, such as cigarettes, chewing tobacco, and e-cigarettes. If you need help quitting, ask your health care provider.  Contact your health care provider if you have any questions. Keep all prenatal visits as told by your health care provider. This is important. This information is not intended to replace advice given to you by your health care provider. Make sure you discuss any questions you have with your health care provider. Document Released: 07/25/2001 Document Revised: 01/06/2016 Document Reviewed: 10/01/2012 Elsevier Interactive Patient Education  2017 [employer].

## 2017-10-31 ENCOUNTER — Encounter: Payer: Self-pay | Admitting: Obstetrics and Gynecology

## 2017-10-31 ENCOUNTER — Ambulatory Visit (INDEPENDENT_AMBULATORY_CARE_PROVIDER_SITE_OTHER): Payer: Medicaid Other | Admitting: Obstetrics and Gynecology

## 2017-10-31 ENCOUNTER — Other Ambulatory Visit: Payer: Self-pay

## 2017-10-31 ENCOUNTER — Other Ambulatory Visit: Payer: Medicaid Other

## 2017-10-31 VITALS — BP 122/82 | HR 99 | Wt 206.0 lb

## 2017-10-31 DIAGNOSIS — Z3A27 27 weeks gestation of pregnancy: Secondary | ICD-10-CM

## 2017-10-31 DIAGNOSIS — Z1389 Encounter for screening for other disorder: Secondary | ICD-10-CM

## 2017-10-31 DIAGNOSIS — Z331 Pregnant state, incidental: Secondary | ICD-10-CM

## 2017-10-31 DIAGNOSIS — Z3482 Encounter for supervision of other normal pregnancy, second trimester: Secondary | ICD-10-CM

## 2017-10-31 DIAGNOSIS — Z131 Encounter for screening for diabetes mellitus: Secondary | ICD-10-CM

## 2017-10-31 LAB — POCT URINALYSIS DIPSTICK
Blood, UA: NEGATIVE
Glucose, UA: NEGATIVE
LEUKOCYTES UA: NEGATIVE
NITRITE UA: NEGATIVE
PROTEIN UA: NEGATIVE

## 2017-10-31 NOTE — Progress Notes (Signed)
Patient ID: [name], female   DOB: Mar 04, 1996, 22 y.o.   MRN: [id number]   LOW-RISK PREGNANCY VISIT Patient name: Joan Andrews MRN [id number]  Date of birth: 05-Dec-1995 Chief Complaint:   Routine Prenatal Visit (PN2 today)  History of Present Illness:   Joan Andrews is a 22 y.o. G49P1011 female at [redacted]w[redacted]d with an Estimated Date of Delivery: 01/27/18 being seen today for ongoing management of a low-risk pregnancy.  Today she reports no complaints. Contractions: Not present. Vag. Bleeding: None.  Movement: Present. denies leaking of fluid. Review of Systems:   Pertinent items are noted in HPI Denies abnormal vaginal discharge w/ itching/odor/irritation, headaches, visual changes, shortness of breath, chest pain, abdominal pain, severe nausea/vomiting, or problems with urination or bowel movements unless otherwise stated above. Pertinent History Reviewed:  Reviewed past medical,surgical, social, obstetrical and family history.  Reviewed problem list, medications and allergies. Physical Assessment:   Vitals:   10/31/17 0957  BP: 122/82  Pulse: 99  Weight: 206 lb (93.4 kg)  Body mass index is 34.28 kg/m.        Physical Examination:   General appearance: Well appearing, and in no distress  Mental status: Alert, oriented to person, place, and time  Skin: Warm & dry  Cardiovascular: Normal heart rate noted  Respiratory: Normal respiratory effort, no distress  Abdomen: Soft, gravid, nontender  Pelvic: Cervical exam deferred         Extremities: Edema: None  Fetal Status: Fetal Heart Rate (bpm): 147 Fundal Height: 26 cm Movement: Present    Results for orders placed or performed in visit on 10/31/17 (from the past 24 hour(s))  POCT urinalysis dipstick   Collection Time: 10/31/17  9:59 AM  Result Value Ref Range   Color, UA     Clarity, UA     Glucose, UA neg    Bilirubin, UA     Ketones, UA small    Spec Grav, UA  1.010 - 1.025   Blood, UA neg    pH,  UA  5.0 - 8.0   Protein, UA neg    Urobilinogen, UA  0.2 or 1.0 E.U./dL   Nitrite, UA neg    Leukocytes, UA Negative Negative   Appearance     Odor      Assessment & Plan:  1) Low-risk pregnancy G3P1011 at [redacted]w[redacted]d with an Estimated Date of Delivery: 01/27/18    Meds: No orders of the defined types were placed in this encounter.  Labs/procedures today: PN2  Plan:  Continue routine obstetrical care   Follow-up: Return in about 4 weeks (around 11/28/2017) for [place].  Orders Placed This Encounter  Procedures  . POCT urinalysis dipstick   By signing my name below, I, Margit Banda, attest that this documentation has been prepared under the direction and in the presence of Jonnie Kind, MD. Electronically Signed: Margit Banda, Medical Scribe. 10/31/17. 10:55 AM.  I personally performed the services described in this documentation, which was SCRIBED in my presence. The recorded information has been reviewed and considered accurate. It has been edited as necessary during review. Jonnie Kind, MD

## 2017-10-31 NOTE — Patient Instructions (Signed)
(  [phone] is the phone number for Pregnancy Classes or hospital tours at [facility].  ° °You will be referred to  [email or web address]   for more information on childbirth classes   °At this site you may register for classes. You may sign up for a waiting list if classes are full. Please SIGN UP FOR THIS!.   When the waiting list becomes long, sometimes new classes can be added. ° ° ° °

## 2017-11-01 LAB — CBC
Hematocrit: 30.9 % — ABNORMAL LOW (ref 34.0–46.6)
Hemoglobin: 10.6 g/dL — ABNORMAL LOW (ref 11.1–15.9)
MCH: 29.5 pg (ref 26.6–33.0)
MCHC: 34.3 g/dL (ref 31.5–35.7)
MCV: 86 fL (ref 79–97)
PLATELETS: 230 10*3/uL (ref 150–379)
RBC: 3.59 x10E6/uL — AB (ref 3.77–5.28)
RDW: 13.4 % (ref 12.3–15.4)
WBC: 4.7 10*3/uL (ref 3.4–10.8)

## 2017-11-01 LAB — GLUCOSE TOLERANCE, 2 HOURS W/ 1HR
GLUCOSE, 1 HOUR: 129 mg/dL (ref 65–179)
GLUCOSE, FASTING: 81 mg/dL (ref 65–91)
Glucose, 2 hour: 69 mg/dL (ref 65–152)

## 2017-11-01 LAB — ANTIBODY SCREEN: Antibody Screen: NEGATIVE

## 2017-11-01 LAB — RPR: RPR Ser Ql: NONREACTIVE

## 2017-11-01 LAB — HIV ANTIBODY (ROUTINE TESTING W REFLEX): HIV SCREEN 4TH GENERATION: NONREACTIVE

## 2017-11-04 ENCOUNTER — Encounter: Payer: Self-pay | Admitting: Women's Health

## 2017-11-14 ENCOUNTER — Encounter: Payer: Medicaid Other | Admitting: Women's Health

## 2017-11-27 ENCOUNTER — Encounter: Payer: Self-pay | Admitting: Women's Health

## 2017-11-28 ENCOUNTER — Ambulatory Visit (INDEPENDENT_AMBULATORY_CARE_PROVIDER_SITE_OTHER): Payer: Medicaid Other | Admitting: Women's Health

## 2017-11-28 ENCOUNTER — Encounter: Payer: Self-pay | Admitting: Women's Health

## 2017-11-28 VITALS — BP 106/64 | HR 92 | Wt 207.0 lb

## 2017-11-28 DIAGNOSIS — Z331 Pregnant state, incidental: Secondary | ICD-10-CM

## 2017-11-28 DIAGNOSIS — Z3483 Encounter for supervision of other normal pregnancy, third trimester: Secondary | ICD-10-CM

## 2017-11-28 DIAGNOSIS — Z3A31 31 weeks gestation of pregnancy: Secondary | ICD-10-CM

## 2017-11-28 DIAGNOSIS — Z1389 Encounter for screening for other disorder: Secondary | ICD-10-CM

## 2017-11-28 DIAGNOSIS — O9989 Other specified diseases and conditions complicating pregnancy, childbirth and the puerperium: Secondary | ICD-10-CM

## 2017-11-28 DIAGNOSIS — R569 Unspecified convulsions: Secondary | ICD-10-CM

## 2017-11-28 LAB — POCT URINALYSIS DIPSTICK
Blood, UA: NEGATIVE
Glucose, UA: NEGATIVE
Leukocytes, UA: NEGATIVE
Nitrite, UA: NEGATIVE

## 2017-11-28 NOTE — Patient Instructions (Signed)
Joan Andrews, I greatly value your feedback.  If you receive a survey following your visit with [place] today, we appreciate you taking the time to fill it out.  Thanks, Knute Neu, CNM, WHNP-BC   Call the office [phone]) or go to [facility] if:  You begin to have strong, frequent contractions  Your water breaks.  Sometimes it is a big gush of fluid, sometimes it is just a trickle that keeps getting your panties wet or running down your legs  You have vaginal bleeding.  It is normal to have a small amount of spotting if your cervix was checked.   You don't feel your baby moving like normal.  If you don't, get you something to eat and drink and lay down and focus on feeling your baby move.  You should feel at least 10 movements in 2 hours.  If you don't, you should call the office or go to [facility].    Tdap Vaccine  It is recommended that you get the Tdap vaccine during the third trimester of EACH pregnancy to help protect your baby from getting pertussis (whooping cough)  27-36 weeks is the BEST time to do this so that you can pass the protection on to your baby. During pregnancy is better than after pregnancy, but if you are unable to get it during pregnancy it will be offered at the hospital.   You can get this vaccine at the health department or your family doctor  Everyone who will be around your baby should also be up-to-date on their vaccines. Adults (who are not pregnant) only need 1 dose of Tdap during adulthood.   Third Trimester of Pregnancy The third trimester is from week 29 through week 42, months 7 through 9. The third trimester is a time when the fetus is growing rapidly. At the end of the ninth month, the fetus is about 20 inches in length and weighs 6-10 pounds.  BODY CHANGES Your body goes through many changes during pregnancy. The changes vary from woman to woman.   Your weight will continue to increase. You can expect to gain 25-35 pounds (11-16  kg) by the end of the pregnancy.  You may begin to get stretch marks on your hips, abdomen, and breasts.  You may urinate more often because the fetus is moving lower into your pelvis and pressing on your bladder.  You may develop or continue to have heartburn as a result of your pregnancy.  You may develop constipation because certain hormones are causing the muscles that push waste through your intestines to slow down.  You may develop hemorrhoids or swollen, bulging veins (varicose veins).  You may have pelvic pain because of the weight gain and pregnancy hormones relaxing your joints between the bones in your pelvis. Backaches may result from overexertion of the muscles supporting your posture.  You may have changes in your hair. These can include thickening of your hair, rapid growth, and changes in texture. Some women also have hair loss during or after pregnancy, or hair that feels dry or thin. Your hair will most likely return to normal after your baby is born.  Your breasts will continue to grow and be tender. A yellow discharge may leak from your breasts called colostrum.  Your belly button may stick out.  You may feel short of breath because of your expanding uterus.  You may notice the fetus "dropping," or moving lower in your abdomen.  You may have a bloody  mucus discharge. This usually occurs a few days to a week before labor begins.  Your cervix becomes thin and soft (effaced) near your due date. WHAT TO EXPECT AT YOUR PRENATAL EXAMS  You will have prenatal exams every 2 weeks until week 36. Then, you will have weekly prenatal exams. During a routine prenatal visit:  You will be weighed to make sure you and the fetus are growing normally.  Your blood pressure is taken.  Your abdomen will be measured to track your baby's growth.  The fetal heartbeat will be listened to.  Any test results from the previous visit will be discussed.  You may have a cervical check  near your due date to see if you have effaced. At around 36 weeks, your caregiver will check your cervix. At the same time, your caregiver will also perform a test on the secretions of the vaginal tissue. This test is to determine if a type of bacteria, Group B streptococcus, is present. Your caregiver will explain this further. Your caregiver may ask you:  What your birth plan is.  How you are feeling.  If you are feeling the baby move.  If you have had any abnormal symptoms, such as leaking fluid, bleeding, severe headaches, or abdominal cramping.  If you have any questions. Other tests or screenings that may be performed during your third trimester include:  Blood tests that check for low iron levels (anemia).  Fetal testing to check the health, activity level, and growth of the fetus. Testing is done if you have certain medical conditions or if there are problems during the pregnancy. FALSE LABOR You may feel small, irregular contractions that eventually go away. These are called Braxton Hicks contractions, or false labor. Contractions may last for hours, days, or even weeks before true labor sets in. If contractions come at regular intervals, intensify, or become painful, it is best to be seen by your caregiver.  SIGNS OF LABOR   Menstrual-like cramps.  Contractions that are 5 minutes apart or less.  Contractions that start on the top of the uterus and spread down to the lower abdomen and back.  A sense of increased pelvic pressure or back pain.  A watery or bloody mucus discharge that comes from the vagina. If you have any of these signs before the 37th week of pregnancy, call your caregiver right away. You need to go to the hospital to get checked immediately. HOME CARE INSTRUCTIONS   Avoid all smoking, herbs, alcohol, and unprescribed drugs. These chemicals affect the formation and growth of the baby.  Follow your caregiver's instructions regarding medicine use. There are  medicines that are either safe or unsafe to take during pregnancy.  Exercise only as directed by your caregiver. Experiencing uterine cramps is a good sign to stop exercising.  Continue to eat regular, healthy meals.  Wear a good support bra for breast tenderness.  Do not use hot tubs, steam rooms, or saunas.  Wear your seat belt at all times when driving.  Avoid raw meat, uncooked cheese, cat litter boxes, and soil used by cats. These carry germs that can cause birth defects in the baby.  Take your prenatal vitamins.  Try taking a stool softener (if your caregiver approves) if you develop constipation. Eat more high-fiber foods, such as fresh vegetables or fruit and whole grains. Drink plenty of fluids to keep your urine clear or pale yellow.  Take warm sitz baths to soothe any pain or discomfort caused by hemorrhoids.  Use hemorrhoid cream if your caregiver approves.  If you develop varicose veins, wear support hose. Elevate your feet for 15 minutes, 3-4 times a day. Limit salt in your diet.  Avoid heavy lifting, wear low heal shoes, and practice good posture.  Rest a lot with your legs elevated if you have leg cramps or low back pain.  Visit your dentist if you have not gone during your pregnancy. Use a soft toothbrush to brush your teeth and be gentle when you floss.  A sexual relationship may be continued unless your caregiver directs you otherwise.  Do not travel far distances unless it is absolutely necessary and only with the approval of your caregiver.  Take prenatal classes to understand, practice, and ask questions about the labor and delivery.  Make a trial run to the hospital.  Pack your hospital bag.  Prepare the baby's nursery.  Continue to go to all your prenatal visits as directed by your caregiver. SEEK MEDICAL CARE IF:  You are unsure if you are in labor or if your water has broken.  You have dizziness.  You have mild pelvic cramps, pelvic pressure, or  nagging pain in your abdominal area.  You have persistent nausea, vomiting, or diarrhea.  You have a bad smelling vaginal discharge.  You have pain with urination. SEEK IMMEDIATE MEDICAL CARE IF:   You have a fever.  You are leaking fluid from your vagina.  You have spotting or bleeding from your vagina.  You have severe abdominal cramping or pain.  You have rapid weight loss or gain.  You have shortness of breath with chest pain.  You notice sudden or extreme swelling of your face, hands, ankles, feet, or legs.  You have not felt your baby move in over an hour.  You have severe headaches that do not go away with medicine.  You have vision changes. Document Released: 07/25/2001 Document Revised: 08/05/2013 Document Reviewed: 10/01/2012 [facility] Patient Information 2015 [place], [place]. This information is not intended to replace advice given to you by your health care provider. Make sure you discuss any questions you have with your health care provider.

## 2017-11-28 NOTE — Progress Notes (Signed)
   LOW-RISK PREGNANCY VISIT Patient name: Joan Andrews MRN [id number]  Date of birth: 12-Nov-1995 Chief Complaint:   Routine Prenatal Visit  History of Present Illness:   Joan Andrews is a 22 y.o. G35P1011 female at [redacted]w[redacted]d with an Estimated Date of Delivery: 01/27/18 being seen today for ongoing management of a low-risk pregnancy.  Today she reports some pressure, braxton hicks. Drinks a lot of soda. No seizure-like activity since prior to seeing Dr. Merlene Laughter, never started Keppra as directed. Contractions: Not present. Vag. Bleeding: None.  Movement: Present. denies leaking of fluid. Review of Systems:   Pertinent items are noted in HPI Denies abnormal vaginal discharge w/ itching/odor/irritation, headaches, visual changes, shortness of breath, chest pain, abdominal pain, severe nausea/vomiting, or problems with urination or bowel movements unless otherwise stated above. Pertinent History Reviewed:  Reviewed past medical,surgical, social, obstetrical and family history.  Reviewed problem list, medications and allergies. Physical Assessment:   Vitals:   11/28/17 1046  BP: 106/64  Pulse: 92  Weight: 207 lb (93.9 kg)  Body mass index is 34.45 kg/m.        Physical Examination:   General appearance: Well appearing, and in no distress  Mental status: Alert, oriented to person, place, and time  Skin: Warm & dry  Cardiovascular: Normal heart rate noted  Respiratory: Normal respiratory effort, no distress  Abdomen: Soft, gravid, nontender  Pelvic: Cervical exam deferred         Extremities: Edema: None  Fetal Status: Fetal Heart Rate (bpm): 151 Fundal Height: 31 cm Movement: Present    Results for orders placed or performed in visit on 11/28/17 (from the past 24 hour(s))  POCT Urinalysis Dipstick   Collection Time: 11/28/17 10:48 AM  Result Value Ref Range   Color, UA     Clarity, UA     Glucose, UA neg    Bilirubin, UA     Ketones, UA trace    Spec Grav, UA  1.010  - 1.025   Blood, UA neg    pH, UA  5.0 - 8.0   Protein, UA 1+    Urobilinogen, UA  0.2 or 1.0 E.U./dL   Nitrite, UA neg    Leukocytes, UA Negative Negative   Appearance     Odor      Assessment & Plan:  1) Low-risk pregnancy G3P1011 at [redacted]w[redacted]d with an Estimated Date of Delivery: 01/27/18   2) Seizure-like activity, none since prior to seeing Dr. Merlene Laughter, never started Keppra as directed   Meds: No orders of the defined types were placed in this encounter.  Labs/procedures today: none  Plan:  Continue routine obstetrical care   Reviewed: Preterm labor symptoms and general obstetric precautions including but not limited to vaginal bleeding, contractions, leaking of fluid and fetal movement were reviewed in detail with the patient.  Recommended Tdap at HD/PCP per CDC guidelines. All questions were answered  Follow-up: Return in about 2 weeks (around 12/12/2017) for [place].  Orders Placed This Encounter  Procedures  . POCT Urinalysis Dipstick   Roma Schanz CNM, [facility] 11/28/2017 11:13 AM

## 2017-12-05 ENCOUNTER — Inpatient Hospital Stay (HOSPITAL_COMMUNITY)
Admission: AD | Admit: 2017-12-05 | Discharge: 2017-12-06 | Disposition: A | Discharge status: Home / Self Care | Payer: Medicaid Other | Source: Ambulatory Visit | Attending: Obstetrics & Gynecology | Admitting: Obstetrics & Gynecology

## 2017-12-05 ENCOUNTER — Encounter (HOSPITAL_COMMUNITY): Payer: Self-pay

## 2017-12-05 DIAGNOSIS — O26893 Other specified pregnancy related conditions, third trimester: Secondary | ICD-10-CM | POA: Insufficient documentation

## 2017-12-05 DIAGNOSIS — Z3A32 32 weeks gestation of pregnancy: Secondary | ICD-10-CM | POA: Diagnosis not present

## 2017-12-05 DIAGNOSIS — O479 False labor, unspecified: Secondary | ICD-10-CM

## 2017-12-05 DIAGNOSIS — O4700 False labor before 37 completed weeks of gestation, unspecified trimester: Secondary | ICD-10-CM

## 2017-12-05 DIAGNOSIS — R110 Nausea: Secondary | ICD-10-CM | POA: Diagnosis present

## 2017-12-05 DIAGNOSIS — R195 Other fecal abnormalities: Secondary | ICD-10-CM

## 2017-12-05 DIAGNOSIS — O4703 False labor before 37 completed weeks of gestation, third trimester: Secondary | ICD-10-CM

## 2017-12-05 LAB — URINALYSIS, ROUTINE W REFLEX MICROSCOPIC
BILIRUBIN URINE: NEGATIVE
Glucose, UA: NEGATIVE mg/dL
Hgb urine dipstick: NEGATIVE
KETONES UR: 20 mg/dL — AB
Nitrite: NEGATIVE
PH: 6 (ref 5.0–8.0)
Protein, ur: 30 mg/dL — AB
SPECIFIC GRAVITY, URINE: 1.026 (ref 1.005–1.030)

## 2017-12-05 MED ORDER — TERBUTALINE SULFATE 1 MG/ML IJ SOLN: 0.2500 mg | Freq: Once | INTRAMUSCULAR | Status: DC

## 2017-12-05 NOTE — MAU Note (Signed)
Pt presents to MAU with c/o of nausea and she has saw red in stool today after eating hot cheetos that were 6 months expired. Pt denies abdominal pain, VB or LOF. +FM

## 2017-12-05 NOTE — MAU Provider Note (Signed)
Chief Complaint:  Nausea   First Provider Initiated Contact with Patient 12/05/17 2301     HPI: Joan Andrews is a 22 y.o. G3P1011 at 55w3dwho presents to maternity admissions reporting red color to stool today.  Thinks it may be due to eating hot cheetos that were red in color.  Denies hx of rectal bleeding.. She reports good fetal movement, denies LOF, vaginal bleeding, vaginal itching/burning, urinary symptoms, h/a, dizziness, n/v, diarrhea, constipation or fever/chills.    Rectal Bleeding   The current episode started today. The onset was sudden. The problem occurs rarely. The problem has been resolved. The patient is experiencing no pain. The stool is described as soft (red in color but thinks it was not blood). Pertinent negatives include no fever, no abdominal pain, no diarrhea, no hematemesis, no hemorrhoids, no nausea, no rectal pain, no vomiting and no vaginal bleeding. There were no sick contacts.   RN  Note: Pt presents to MAU with c/o of nausea and she has saw red in stool today after eating hot cheetos that were 6 months expired. Pt denies abdominal pain, VB or LOF. +FM    Past Medical History: Past Medical History:  Diagnosis Date  . Bronchitis    as a child only  . Contraceptive management 12/26/2013  . Miscarriage   . Pregnant 07/14/2014  . Recurrent tonsillitis   . Supervision of normal pregnancy in first trimester 07/27/2015    [place] Initiated Care at   7+5 weeks FOB  Stephanie Acre 23 yo bm Dating By  LMP and [place] Pap  07/27/15 GC/CT Initial:                36+wks: Genetic Screen NT/IT:  CF screen  Anatomic [place]  Flu vaccine  Tdap Recommended ~ 28wks Glucose Screen  2 hr GBS  Feed Preference  Contraception  Circumcision  Childbirth Classes  Pediatrician      Past obstetric history: OB History  Gravida Para Term Preterm AB Living  3 1 1   1 1   SAB TAB Ectopic Multiple Live Births  1     0 1    # Outcome Date GA Lbr Len/2nd Weight Sex Delivery Anes  PTL Lv  3 Current           2 Term 03/01/16 [redacted]w[redacted]d 26:46 / 00:17 6 lb 14.6 oz (3.135 kg) M Vag-Spont Local N LIV     Birth Comments: none  1 SAB 2014            Past Surgical History: Past Surgical History:  Procedure Laterality Date  . HERNIA REPAIR      Family History: Family History  Problem Relation Age of Onset  . Hypertension Father   . Alzheimer's disease Maternal Grandmother   . Hypertension Mother   . Diabetes Paternal Uncle   . Diabetes Cousin     Social History: Social History   Tobacco Use  . Smoking status: Never Smoker  . Smokeless tobacco: Never Used  Substance Use Topics  . Alcohol use: No  . Drug use: No    Allergies: No Known Allergies  Meds:  No medications prior to admission.    I have reviewed patient's Past Medical Hx, Surgical Hx, Family Hx, Social Hx, medications and allergies.   ROS:  Review of Systems  Constitutional: Negative for fever.  Gastrointestinal: Positive for hematochezia. Negative for abdominal pain, diarrhea, hematemesis, hemorrhoids, nausea, rectal pain and vomiting.  Genitourinary: Negative for vaginal bleeding.  Other systems negative  Physical Exam   Patient Vitals for the past 24 hrs:  BP Temp Temp src Pulse Resp  12/05/17 2217 99/64 98.3 F (36.8 C) Oral (!) 110 18   Constitutional: Well-developed, well-nourished female in no acute distress.  Cardiovascular: normal rate and rhythm Respiratory: normal effort, clear to auscultation bilaterally GI: Abd soft, non-tender, gravid appropriate for gestational age.   No rebound or guarding.   Rectal exam unremarkable, no blood on glove, no hemorrhoids palpated MS: Extremities nontender, no edema, normal ROM Neurologic: Alert and oriented x 4.  GU: Neg CVAT.  PELVIC EXAM: Dilation: 1 Effacement (%): 50 Station: -1 Exam by:: Carmelia Roller CNM   FHT:  Baseline 140 , moderate variability, accelerations present, no decelerations Contractions: q 8-10 mins Irregular     Labs: Results for orders placed or performed during the hospital encounter of 12/05/17 (from the past 24 hour(s))  Urinalysis, Routine w reflex microscopic     Status: Abnormal   Collection Time: 12/05/17 10:06 PM  Result Value Ref Range   Color, Urine YELLOW YELLOW   APPearance CLOUDY (A) CLEAR   Specific Gravity, Urine 1.026 1.005 - 1.030   pH 6.0 5.0 - 8.0   Glucose, UA NEGATIVE NEGATIVE mg/dL   Hgb urine dipstick NEGATIVE NEGATIVE   Bilirubin Urine NEGATIVE NEGATIVE   Ketones, ur 20 (A) NEGATIVE mg/dL   Protein, ur 30 (A) NEGATIVE mg/dL   Nitrite NEGATIVE NEGATIVE   Leukocytes, UA LARGE (A) NEGATIVE   RBC / HPF 0-5 0 - 5 RBC/hpf   WBC, UA 11-20 0 - 5 WBC/hpf   Bacteria, UA RARE (A) NONE SEEN   Squamous Epithelial / LPF 21-50 0 - 5   Mucus PRESENT   Fetal fibronectin     Status: None   Collection Time: 12/05/17 11:20 PM  Result Value Ref Range   Fetal Fibronectin NEGATIVE NEGATIVE   Appearance, FETFIB CLEAR CLEAR    Imaging:  No results found.  MAU Course/MDM: I have ordered labs and reviewed results. We sent a fetal fibronectin when we noticed irregular contractions. THis was negative.  She did not feel any of the contractions.  NST reviewed, reactive.  Treatments in MAU included Terbutaline.(refused).  Procardia given x 1 with relief from contractions (stopped) Discussed stool color likely related to red dye  Assessment:  SIUP at [redacted]w[redacted]d Red stool likely due to food dye Irregular contractions with cervical change, but stopped with procardia, FFn negative  Plan: Discharge home Preterm Labor precautions and fetal kick counts Follow up in Office for prenatal visits and recheck  Encouraged to return here or to other Urgent Care/ED if she develops worsening of symptoms, increase in pain, fever, or other concerning symptoms.   Pt stable at time of discharge.  Hansel Feinstein CNM, MSN Certified Nurse-Midwife 12/05/2017 11:01 PM

## 2017-12-06 ENCOUNTER — Encounter (HOSPITAL_COMMUNITY): Payer: Self-pay | Admitting: Advanced Practice Midwife

## 2017-12-06 ENCOUNTER — Encounter: Payer: Self-pay | Admitting: Women's Health

## 2017-12-06 LAB — FETAL FIBRONECTIN: Fetal Fibronectin: NEGATIVE

## 2017-12-06 MED ORDER — NIFEDIPINE 10 MG PO CAPS
10.0000 mg | ORAL_CAPSULE | Freq: Once | ORAL | Status: AC
  Administered 2017-12-06: 10 mg via ORAL
  Filled 2017-12-06: qty 1

## 2017-12-06 NOTE — Discharge Instructions (Signed)

## 2017-12-08 ENCOUNTER — Encounter (HOSPITAL_COMMUNITY): Payer: Self-pay

## 2017-12-08 ENCOUNTER — Inpatient Hospital Stay (HOSPITAL_COMMUNITY)
Admission: AD | Admit: 2017-12-08 | Discharge: 2017-12-08 | Disposition: A | Discharge status: Home / Self Care | Payer: Medicaid Other | Source: Ambulatory Visit | Attending: Obstetrics and Gynecology | Admitting: Obstetrics and Gynecology

## 2017-12-08 DIAGNOSIS — O26893 Other specified pregnancy related conditions, third trimester: Secondary | ICD-10-CM | POA: Diagnosis not present

## 2017-12-08 DIAGNOSIS — Z3A32 32 weeks gestation of pregnancy: Secondary | ICD-10-CM | POA: Diagnosis not present

## 2017-12-08 DIAGNOSIS — N898 Other specified noninflammatory disorders of vagina: Secondary | ICD-10-CM | POA: Insufficient documentation

## 2017-12-08 LAB — URINALYSIS, ROUTINE W REFLEX MICROSCOPIC
BILIRUBIN URINE: NEGATIVE
GLUCOSE, UA: NEGATIVE mg/dL
HGB URINE DIPSTICK: NEGATIVE
Ketones, ur: 5 mg/dL — AB
NITRITE: NEGATIVE
Protein, ur: 30 mg/dL — AB
SPECIFIC GRAVITY, URINE: 1.03 (ref 1.005–1.030)
pH: 6 (ref 5.0–8.0)

## 2017-12-08 LAB — WET PREP, GENITAL
Clue Cells Wet Prep HPF POC: NONE SEEN
SPERM: NONE SEEN
Trich, Wet Prep: NONE SEEN
YEAST WET PREP: NONE SEEN

## 2017-12-08 NOTE — Discharge Instructions (Signed)

## 2017-12-08 NOTE — MAU Note (Signed)
Was here 3 days ago with preterm ctx, given procardia here. Today mucous plug came out and has felt some ctx, irregular. No LOF, no vaginal bleeding, +FM

## 2017-12-08 NOTE — MAU Provider Note (Signed)
History   G3P1011 @ 32.6 wks in with c/o losing mucous plug and ocassional mild contractions. States they are not regular. Denies ROM or vag bleeding.  CSN: [id number]  Arrival date & time 12/08/17  1606   None     Chief Complaint  Patient presents with  . Contractions    HPI  Past Medical History:  Diagnosis Date  . Bronchitis    as a child only  . Contraceptive management 12/26/2013  . Miscarriage   . Pregnant 07/14/2014  . Recurrent tonsillitis   . Supervision of normal pregnancy in first trimester 07/27/2015    [facility] at   7+5 weeks FOB  Stephanie Acre 22 yo bm Dating By  LMP and [place] Pap  07/27/15 GC/CT Initial:                36+wks: Genetic Screen NT/IT:  CF screen  Anatomic [place]  Flu vaccine  Tdap Recommended ~ 28wks Glucose Screen  2 hr GBS  Feed Preference  Contraception  Circumcision  Childbirth Classes  Pediatrician      Past Surgical History:  Procedure Laterality Date  . HERNIA REPAIR      Family History  Problem Relation Age of Onset  . Hypertension Father   . Alzheimer's disease Maternal Grandmother   . Hypertension Mother   . Diabetes Paternal Uncle   . Diabetes Cousin     Social History   Tobacco Use  . Smoking status: Never Smoker  . Smokeless tobacco: Never Used  Substance Use Topics  . Alcohol use: No  . Drug use: No    OB History    Gravida  3   Para  1   Term  1   Preterm      AB  1   Living  1     SAB  1   TAB      Ectopic      Multiple  0   Live Births  1           Review of Systems  Constitutional: Negative.   HENT: Negative.   Eyes: Negative.   Respiratory: Negative.   Cardiovascular: Negative.   Gastrointestinal: Positive for abdominal pain.  Endocrine: Negative.   Genitourinary: Positive for vaginal discharge.  Musculoskeletal: Negative.   Skin: Negative.   Allergic/Immunologic: Negative.   Neurological: Negative.   Hematological: Negative.   Psychiatric/Behavioral: Negative.      Allergies  Patient has no known allergies.  Home Medications    BP 126/74 (BP Location: Left Arm)   Pulse (!) 113   Temp 98.6 F (37 C) (Oral)   Resp 18   Ht 5\' 5"  (1.651 m)   Wt 207 lb (93.9 kg)   LMP 04/16/2017 (Exact Date)   BMI 34.45 kg/m   Physical Exam  Constitutional: She is oriented to person, place, and time. She appears well-developed and well-nourished.  HENT:  Head: Normocephalic.  Neck: Normal range of motion.  Cardiovascular: Normal rate, regular rhythm and normal heart sounds.  Genitourinary: Vagina normal and uterus normal.  Musculoskeletal: Normal range of motion.  Neurological: She is alert and oriented to person, place, and time.  Skin: Skin is warm and dry.    MAU Course  Procedures (including critical care time)  Labs Reviewed  WET PREP, GENITAL  URINALYSIS, ROUTINE W REFLEX MICROSCOPIC  GC/CHLAMYDIA PROBE AMP ([place]) NOT AT [facility]   No results found.   1. Vaginal discharge in pregnancy in  third trimester       MDM  VSS, Wet prep . Cultures obtained. SVE tft/th/post/high. FHR 150's 15x15 accels. no decels.no contractions at present. D/c home

## 2017-12-11 LAB — GC/CHLAMYDIA PROBE AMP (~~LOC~~) NOT AT ARMC
Chlamydia: NEGATIVE
NEISSERIA GONORRHEA: NEGATIVE

## 2017-12-12 ENCOUNTER — Other Ambulatory Visit: Payer: Self-pay

## 2017-12-12 ENCOUNTER — Encounter: Payer: Self-pay | Admitting: Women's Health

## 2017-12-12 ENCOUNTER — Ambulatory Visit (INDEPENDENT_AMBULATORY_CARE_PROVIDER_SITE_OTHER): Payer: Medicaid Other | Admitting: Women's Health

## 2017-12-12 ENCOUNTER — Ambulatory Visit (INDEPENDENT_AMBULATORY_CARE_PROVIDER_SITE_OTHER): Payer: Medicaid Other

## 2017-12-12 VITALS — BP 104/68 | HR 95 | Wt 207.0 lb

## 2017-12-12 DIAGNOSIS — Z3A33 33 weeks gestation of pregnancy: Secondary | ICD-10-CM

## 2017-12-12 DIAGNOSIS — Z1389 Encounter for screening for other disorder: Secondary | ICD-10-CM

## 2017-12-12 DIAGNOSIS — Z3483 Encounter for supervision of other normal pregnancy, third trimester: Secondary | ICD-10-CM

## 2017-12-12 DIAGNOSIS — O26843 Uterine size-date discrepancy, third trimester: Secondary | ICD-10-CM

## 2017-12-12 DIAGNOSIS — Z331 Pregnant state, incidental: Secondary | ICD-10-CM

## 2017-12-12 LAB — POCT URINALYSIS DIPSTICK
Blood, UA: NEGATIVE
GLUCOSE UA: NEGATIVE
KETONES UA: NEGATIVE
Leukocytes, UA: NEGATIVE
Nitrite, UA: NEGATIVE

## 2017-12-12 NOTE — Progress Notes (Signed)
   LOW-RISK PREGNANCY VISIT Patient name: Joan Andrews MRN [id number]  Date of birth: [birth year] Chief Complaint:   Routine Prenatal Visit  History of Present Illness:   Joan Andrews is a 22 y.o. G25P1011 female at [redacted]w[redacted]d with an Estimated Date of Delivery: 01/27/18 being seen today for ongoing management of a low-risk pregnancy.  Today she reports no complaints. Contractions: Irregular. Vag. Bleeding: None.  Movement: Present. denies leaking of fluid. Review of Systems:   Pertinent items are noted in HPI Denies abnormal vaginal discharge w/ itching/odor/irritation, headaches, visual changes, shortness of breath, chest pain, abdominal pain, severe nausea/vomiting, or problems with urination or bowel movements unless otherwise stated above. Pertinent History Reviewed:  Reviewed past medical,surgical, social, obstetrical and family history.  Reviewed problem list, medications and allergies. Physical Assessment:   Vitals:   12/12/17 1011  BP: 104/68  Pulse: 95  Weight: 207 lb (93.9 kg)  Body mass index is 34.45 kg/m.        Physical Examination:   General appearance: Well appearing, and in no distress  Mental status: Alert, oriented to person, place, and time  Skin: Warm & dry  Cardiovascular: Normal heart rate noted  Respiratory: Normal respiratory effort, no distress  Abdomen: Soft, gravid, nontender  Pelvic: Cervical exam deferred         Extremities: Edema: None  Fetal Status: Fetal Heart Rate (bpm): 133 Fundal Height: 29 cm Movement: Present    Results for orders placed or performed in visit on 12/12/17 (from the past 24 hour(s))  POCT urinalysis dipstick   Collection Time: 12/12/17 10:13 AM  Result Value Ref Range   Color, UA     Clarity, UA     Glucose, UA neg    Bilirubin, UA     Ketones, UA neg    Spec Grav, UA  1.010 - 1.025   Blood, UA neg    pH, UA  5.0 - 8.0   Protein, UA trace    Urobilinogen, UA  0.2 or 1.0 E.U./dL   Nitrite, UA neg    Leukocytes, UA Negative Negative   Appearance     Odor      Assessment & Plan:  1) Low-risk pregnancy G3P1011 at [redacted]w[redacted]d with an Estimated Date of Delivery: 01/27/18   2) Uterine size < dates, will get u/s   Meds: No orders of the defined types were placed in this encounter.  Labs/procedures today: none  Plan:  Continue routine obstetrical care   Reviewed: Preterm labor symptoms and general obstetric precautions including but not limited to vaginal bleeding, contractions, leaking of fluid and fetal movement were reviewed in detail with the patient.  All questions were answered  Follow-up: Return for asap EFW u/s (no visit), then 2wks for LROB.  Orders Placed This Encounter  Procedures  . US OB Follow Up  . POCT urinalysis dipstick   Roma Schanz CNM, [facility] 12/12/2017 10:33 AM

## 2017-12-12 NOTE — Progress Notes (Signed)
[place] 82+9 wks,cephalic,fhr 562 bpm,posterior pl gr 0,afi 15 cm,normal ovaries bilat,efw 2106 g 31%,limited view of head because of fetal position

## 2017-12-12 NOTE — Patient Instructions (Signed)
Joan Andrews, I greatly value your feedback.  If you receive a survey following your visit with [place] today, we appreciate you taking the time to fill it out.  Thanks, Knute Neu, CNM, WHNP-BC   Call the office [phone]) or go to [facility] if:  You begin to have strong, frequent contractions  Your water breaks.  Sometimes it is a big gush of fluid, sometimes it is just a trickle that keeps getting your panties wet or running down your legs  You have vaginal bleeding.  It is normal to have a small amount of spotting if your cervix was checked.   You don't feel your baby moving like normal.  If you don't, get you something to eat and drink and lay down and focus on feeling your baby move.  You should feel at least 10 movements in 2 hours.  If you don't, you should call the office or go to [place] and Birth Information The normal length of a pregnancy is 39-41 weeks. Preterm labor is when labor starts before 37 completed weeks of pregnancy. What are the risk factors for preterm labor? Preterm labor is more likely to occur in women who:  Have certain infections during pregnancy such as a bladder infection, sexually transmitted infection, or infection inside the uterus (chorioamnionitis).  Have a shorter-than-normal cervix.  Have gone into preterm labor before.  Have had surgery on their cervix.  Are younger than age 44 or older than age 70.  Are African American.  Are pregnant with twins or multiple babies (multiple gestation).  Take street drugs or smoke while pregnant.  Do not gain enough weight while pregnant.  Became pregnant shortly after having been pregnant.  What are the symptoms of preterm labor? Symptoms of preterm labor include:  Cramps similar to those that can happen during a menstrual period. The cramps may happen with diarrhea.  Pain in the abdomen or lower back.  Regular uterine contractions that may feel like  tightening of the abdomen.  A feeling of increased pressure in the pelvis.  Increased watery or bloody mucus discharge from the vagina.  Water breaking (ruptured amniotic sac).  Why is it important to recognize signs of preterm labor? It is important to recognize signs of preterm labor because babies who are born prematurely may not be fully developed. This can put them at an increased risk for:  Long-term (chronic) heart and lung problems.  Difficulty immediately after birth with regulating body systems, including blood sugar, body temperature, heart rate, and breathing rate.  Bleeding in the brain.  Cerebral palsy.  Learning difficulties.  Death.  These risks are highest for babies who are born before 31 weeks of pregnancy. How is preterm labor treated? Treatment depends on the length of your pregnancy, your condition, and the health of your baby. It may involve:  Having a stitch (suture) placed in your cervix to prevent your cervix from opening too early (cerclage).  Taking or being given medicines, such as: ? Hormone medicines. These may be given early in pregnancy to help support the pregnancy. ? Medicine to stop contractions. ? Medicines to help mature the baby's lungs. These may be prescribed if the risk of delivery is high. ? Medicines to prevent your baby from developing cerebral palsy.  If the labor happens before 34 weeks of pregnancy, you may need to stay in the hospital. What should I do if I think I am in preterm labor? If  you think that you are going into preterm labor, call your health care provider right away. How can I prevent preterm labor in future pregnancies? To increase your chance of having a full-term pregnancy:  Do not use any tobacco products, such as cigarettes, chewing tobacco, and e-cigarettes. If you need help quitting, ask your health care provider.  Do not use street drugs or medicines that have not been prescribed to you during your  pregnancy.  Talk with your health care provider before taking any herbal supplements, even if you have been taking them regularly.  Make sure you gain a healthy amount of weight during your pregnancy.  Watch for infection. If you think that you might have an infection, get it checked right away.  Make sure to tell your health care provider if you have gone into preterm labor before.  This information is not intended to replace advice given to you by your health care provider. Make sure you discuss any questions you have with your health care provider. Document Released: 10/21/2003 Document Revised: 01/11/2016 Document Reviewed: 12/22/2015 Elsevier Interactive Patient Education  2018 [employer].

## 2017-12-20 ENCOUNTER — Encounter: Payer: Self-pay | Admitting: Women's Health

## 2017-12-26 ENCOUNTER — Ambulatory Visit (INDEPENDENT_AMBULATORY_CARE_PROVIDER_SITE_OTHER): Payer: Medicaid Other | Admitting: Women's Health

## 2017-12-26 ENCOUNTER — Encounter: Payer: Self-pay | Admitting: Women's Health

## 2017-12-26 VITALS — BP 100/70 | HR 98 | Wt 206.0 lb

## 2017-12-26 DIAGNOSIS — Z3483 Encounter for supervision of other normal pregnancy, third trimester: Secondary | ICD-10-CM

## 2017-12-26 DIAGNOSIS — Z1389 Encounter for screening for other disorder: Secondary | ICD-10-CM

## 2017-12-26 DIAGNOSIS — Z331 Pregnant state, incidental: Secondary | ICD-10-CM

## 2017-12-26 DIAGNOSIS — Z3A35 35 weeks gestation of pregnancy: Secondary | ICD-10-CM

## 2017-12-26 LAB — POCT URINALYSIS DIPSTICK
GLUCOSE UA: NEGATIVE
Ketones, UA: NEGATIVE
LEUKOCYTES UA: NEGATIVE
Nitrite, UA: NEGATIVE
RBC UA: NEGATIVE

## 2017-12-26 NOTE — Patient Instructions (Signed)
Joan Andrews, I greatly value your feedback.  If you receive a survey following your visit with [place] today, we appreciate you taking the time to fill it out.  Thanks, Knute Neu, CNM, WHNP-BC   Call the office [phone]) or go to [facility] if:  You begin to have strong, frequent contractions  Your water breaks.  Sometimes it is a big gush of fluid, sometimes it is just a trickle that keeps getting your panties wet or running down your legs  You have vaginal bleeding.  It is normal to have a small amount of spotting if your cervix was checked.   You don't feel your baby moving like normal.  If you don't, get you something to eat and drink and lay down and focus on feeling your baby move.  You should feel at least 10 movements in 2 hours.  If you don't, you should call the office or go to [place] and Birth Information The normal length of a pregnancy is 39-41 weeks. Preterm labor is when labor starts before 37 completed weeks of pregnancy. What are the risk factors for preterm labor? Preterm labor is more likely to occur in women who:  Have certain infections during pregnancy such as a bladder infection, sexually transmitted infection, or infection inside the uterus (chorioamnionitis).  Have a shorter-than-normal cervix.  Have gone into preterm labor before.  Have had surgery on their cervix.  Are younger than age 45 or older than age 54.  Are African American.  Are pregnant with twins or multiple babies (multiple gestation).  Take street drugs or smoke while pregnant.  Do not gain enough weight while pregnant.  Became pregnant shortly after having been pregnant.  What are the symptoms of preterm labor? Symptoms of preterm labor include:  Cramps similar to those that can happen during a menstrual period. The cramps may happen with diarrhea.  Pain in the abdomen or lower back.  Regular uterine contractions that may feel like  tightening of the abdomen.  A feeling of increased pressure in the pelvis.  Increased watery or bloody mucus discharge from the vagina.  Water breaking (ruptured amniotic sac).  Why is it important to recognize signs of preterm labor? It is important to recognize signs of preterm labor because babies who are born prematurely may not be fully developed. This can put them at an increased risk for:  Long-term (chronic) heart and lung problems.  Difficulty immediately after birth with regulating body systems, including blood sugar, body temperature, heart rate, and breathing rate.  Bleeding in the brain.  Cerebral palsy.  Learning difficulties.  Death.  These risks are highest for babies who are born before 36 weeks of pregnancy. How is preterm labor treated? Treatment depends on the length of your pregnancy, your condition, and the health of your baby. It may involve:  Having a stitch (suture) placed in your cervix to prevent your cervix from opening too early (cerclage).  Taking or being given medicines, such as: ? Hormone medicines. These may be given early in pregnancy to help support the pregnancy. ? Medicine to stop contractions. ? Medicines to help mature the baby's lungs. These may be prescribed if the risk of delivery is high. ? Medicines to prevent your baby from developing cerebral palsy.  If the labor happens before 34 weeks of pregnancy, you may need to stay in the hospital. What should I do if I think I am in preterm labor? If  you think that you are going into preterm labor, call your health care provider right away. How can I prevent preterm labor in future pregnancies? To increase your chance of having a full-term pregnancy:  Do not use any tobacco products, such as cigarettes, chewing tobacco, and e-cigarettes. If you need help quitting, ask your health care provider.  Do not use street drugs or medicines that have not been prescribed to you during your  pregnancy.  Talk with your health care provider before taking any herbal supplements, even if you have been taking them regularly.  Make sure you gain a healthy amount of weight during your pregnancy.  Watch for infection. If you think that you might have an infection, get it checked right away.  Make sure to tell your health care provider if you have gone into preterm labor before.  This information is not intended to replace advice given to you by your health care provider. Make sure you discuss any questions you have with your health care provider. Document Released: 10/21/2003 Document Revised: 01/11/2016 Document Reviewed: 12/22/2015 Elsevier Interactive Patient Education  2018 [employer].

## 2017-12-26 NOTE — Progress Notes (Signed)
   LOW-RISK PREGNANCY VISIT Patient name: Joan Andrews MRN [id number]  Date of birth: 06-24-1996 Chief Complaint:   low risk ob  History of Present Illness:   Joan Andrews is a 22 y.o. G16P1011 female at [redacted]w[redacted]d with an Estimated Date of Delivery: 01/27/18 being seen today for ongoing management of a low-risk pregnancy.  Today she reports contractions in groin/upper thighs, pelvis popping. Contractions: Regular.  .  Movement: Present. denies leaking of fluid. Review of Systems:   Pertinent items are noted in HPI Denies abnormal vaginal discharge w/ itching/odor/irritation, headaches, visual changes, shortness of breath, chest pain, abdominal pain, severe nausea/vomiting, or problems with urination or bowel movements unless otherwise stated above. Pertinent History Reviewed:  Reviewed past medical,surgical, social, obstetrical and family history.  Reviewed problem list, medications and allergies. Physical Assessment:   Vitals:   12/26/17 1059  BP: 100/70  Pulse: 98  Weight: 206 lb (93.4 kg)  Body mass index is 34.28 kg/m.        Physical Examination:   General appearance: Well appearing, and in no distress  Mental status: Alert, oriented to person, place, and time  Skin: Warm & dry  Cardiovascular: Normal heart rate noted  Respiratory: Normal respiratory effort, no distress  Abdomen: Soft, gravid, nontender  Pelvic: Cervical exam deferred         Extremities: Edema: None  Fetal Status: Fetal Heart Rate (bpm): 140 Fundal Height: 30 cm Movement: Present    Results for orders placed or performed in visit on 12/26/17 (from the past 24 hour(s))  POCT urinalysis dipstick   Collection Time: 12/26/17 11:04 AM  Result Value Ref Range   Color, UA     Clarity, UA     Glucose, UA neg    Bilirubin, UA     Ketones, UA neg    Spec Grav, UA  1.010 - 1.025   Blood, UA neg    pH, UA  5.0 - 8.0   Protein, UA trace    Urobilinogen, UA  0.2 or 1.0 E.U./dL   Nitrite, UA neg      Leukocytes, UA Negative Negative   Appearance     Odor      Assessment & Plan:  1) Low-risk pregnancy G3P1011 at [redacted]w[redacted]d with an Estimated Date of Delivery: 01/27/18    Meds: No orders of the defined types were placed in this encounter.  Labs/procedures today: none  Plan:  Continue routine obstetrical care   Reviewed: Preterm labor symptoms and general obstetric precautions including but not limited to vaginal bleeding, contractions, leaking of fluid and fetal movement were reviewed in detail with the patient.  All questions were answered  Follow-up: Return in about 8 days (around 01/03/2018) for [place]. and gbs  Orders Placed This Encounter  Procedures  . POCT urinalysis dipstick   Roma Schanz CNM, [facility] 12/26/2017 11:30 AM

## 2017-12-27 ENCOUNTER — Encounter: Payer: Self-pay | Admitting: Women's Health

## 2017-12-29 ENCOUNTER — Inpatient Hospital Stay (HOSPITAL_COMMUNITY)
Admission: AD | Admit: 2017-12-29 | Discharge: 2017-12-30 | Disposition: A | Discharge status: Home / Self Care | Payer: Medicaid Other | Source: Ambulatory Visit | Attending: Obstetrics and Gynecology | Admitting: Obstetrics and Gynecology

## 2017-12-29 ENCOUNTER — Encounter (HOSPITAL_COMMUNITY): Payer: Self-pay

## 2017-12-29 DIAGNOSIS — Z8669 Personal history of other diseases of the nervous system and sense organs: Secondary | ICD-10-CM

## 2017-12-29 DIAGNOSIS — O99283 Endocrine, nutritional and metabolic diseases complicating pregnancy, third trimester: Secondary | ICD-10-CM | POA: Insufficient documentation

## 2017-12-29 DIAGNOSIS — G44201 Tension-type headache, unspecified, intractable: Secondary | ICD-10-CM | POA: Insufficient documentation

## 2017-12-29 DIAGNOSIS — Z79899 Other long term (current) drug therapy: Secondary | ICD-10-CM | POA: Insufficient documentation

## 2017-12-29 DIAGNOSIS — O09293 Supervision of pregnancy with other poor reproductive or obstetric history, third trimester: Secondary | ICD-10-CM | POA: Insufficient documentation

## 2017-12-29 DIAGNOSIS — Z8249 Family history of ischemic heart disease and other diseases of the circulatory system: Secondary | ICD-10-CM | POA: Diagnosis not present

## 2017-12-29 DIAGNOSIS — R569 Unspecified convulsions: Secondary | ICD-10-CM | POA: Diagnosis not present

## 2017-12-29 DIAGNOSIS — O99353 Diseases of the nervous system complicating pregnancy, third trimester: Secondary | ICD-10-CM | POA: Diagnosis present

## 2017-12-29 DIAGNOSIS — Z3A35 35 weeks gestation of pregnancy: Secondary | ICD-10-CM | POA: Diagnosis not present

## 2017-12-29 DIAGNOSIS — Z3483 Encounter for supervision of other normal pregnancy, third trimester: Secondary | ICD-10-CM

## 2017-12-29 DIAGNOSIS — R51 Headache: Secondary | ICD-10-CM | POA: Diagnosis present

## 2017-12-29 DIAGNOSIS — E860 Dehydration: Secondary | ICD-10-CM | POA: Diagnosis not present

## 2017-12-29 DIAGNOSIS — O9989 Other specified diseases and conditions complicating pregnancy, childbirth and the puerperium: Secondary | ICD-10-CM

## 2017-12-29 DIAGNOSIS — O1213 Gestational proteinuria, third trimester: Secondary | ICD-10-CM | POA: Diagnosis present

## 2017-12-29 HISTORY — DX: Unspecified convulsions: R56.9

## 2017-12-29 LAB — URINALYSIS, ROUTINE W REFLEX MICROSCOPIC
Bilirubin Urine: NEGATIVE
Glucose, UA: NEGATIVE mg/dL
Hgb urine dipstick: NEGATIVE
Ketones, ur: 15 mg/dL — AB
Nitrite: NEGATIVE
Protein, ur: 30 mg/dL — AB
Specific Gravity, Urine: 1.025 (ref 1.005–1.030)
pH: 6 (ref 5.0–8.0)

## 2017-12-29 LAB — URINALYSIS, MICROSCOPIC (REFLEX)

## 2017-12-29 LAB — PROTEIN / CREATININE RATIO, URINE
Creatinine, Urine: 277 mg/dL
Protein Creatinine Ratio: 0.23 mg/mg{Cre} — ABNORMAL HIGH (ref 0.00–0.15)
Total Protein, Urine: 63 mg/dL

## 2017-12-29 LAB — CBC
HCT: 30.4 % — ABNORMAL LOW (ref 36.0–46.0)
Hemoglobin: 9.9 g/dL — ABNORMAL LOW (ref 12.0–15.0)
MCH: 27.4 pg (ref 26.0–34.0)
MCHC: 32.6 g/dL (ref 30.0–36.0)
MCV: 84.2 fL (ref 78.0–100.0)
Platelets: 228 10*3/uL (ref 150–400)
RBC: 3.61 MIL/uL — ABNORMAL LOW (ref 3.87–5.11)
RDW: 13.3 % (ref 11.5–15.5)
WBC: 8.8 10*3/uL (ref 4.0–10.5)

## 2017-12-29 MED ORDER — LACTATED RINGERS IV BOLUS
1000.0000 mL | Freq: Once | INTRAVENOUS | Status: AC
  Administered 2017-12-29: 1000 mL via INTRAVENOUS

## 2017-12-29 MED ORDER — BUTALBITAL-APAP-CAFFEINE 50-325-40 MG PO TABS
1.0000 | ORAL_TABLET | Freq: Four times a day (QID) | ORAL | 0 refills | Status: DC | PRN
Start: 2017-12-29 — End: 2018-04-04

## 2017-12-29 MED ORDER — METOCLOPRAMIDE HCL 5 MG/ML IJ SOLN
10.0000 mg | Freq: Once | INTRAMUSCULAR | Status: AC
  Administered 2017-12-29: 10 mg via INTRAVENOUS
  Filled 2017-12-29: qty 2

## 2017-12-29 MED ORDER — DIPHENHYDRAMINE HCL 50 MG/ML IJ SOLN
25.0000 mg | Freq: Once | INTRAMUSCULAR | Status: AC
  Administered 2017-12-29: 25 mg via INTRAVENOUS
  Filled 2017-12-29: qty 1

## 2017-12-29 MED ORDER — DEXAMETHASONE SODIUM PHOSPHATE 10 MG/ML IJ SOLN
10.0000 mg | Freq: Once | INTRAMUSCULAR | Status: AC
  Administered 2017-12-29: 10 mg via INTRAVENOUS
  Filled 2017-12-29: qty 1

## 2017-12-29 NOTE — MAU Provider Note (Signed)
Chief Complaint:  Headache   First Provider Initiated Contact with Patient 12/29/17 2215      HPI: Joan Andrews is a 22 y.o. G3P1011 at 55w6dwho presents to maternity admissions reporting headache. She reports waking up with a headache this morning, she states her HA is specific to the right side of head and radiates down neck. She describes pain as tension and pressure, rates HA 8/10- has taken Keppra around noon but has not relieved HA. She reports having a hx of seizures since 10/18. Last seizure in Jan 2019- patient was on Keppra for seizures but has not taken medication as prescribed and takes medication sporadically.    She reports good fetal movement, denies LOF, vaginal bleeding, vaginal itching/burning, urinary symptoms, dizziness, n/v, or fever/chills.    Past Medical History: Past Medical History:  Diagnosis Date  . Bronchitis    as a child only  . Contraceptive management 12/26/2013  . Miscarriage   . Pregnant 07/14/2014  . Recurrent tonsillitis   . Seizures ([place])    Questionable  . Supervision of normal pregnancy in first trimester 07/27/2015    [place] Initiated Care at   7+5 weeks FOB  Stephanie Acre 23 yo bm Dating By  LMP and [place] Pap  07/27/15 GC/CT Initial:                36+wks: Genetic Screen NT/IT:  CF screen  Anatomic [place]  Flu vaccine  Tdap Recommended ~ 28wks Glucose Screen  2 hr GBS  Feed Preference  Contraception  Circumcision  Childbirth Classes  Pediatrician      Past obstetric history: OB History  Gravida Para Term Preterm AB Living  3 1 1   1 1   SAB TAB Ectopic Multiple Live Births  1     0 1    # Outcome Date GA Lbr Len/2nd Weight Sex Delivery Anes PTL Lv  3 Current           2 Term 03/01/16 [redacted]w[redacted]d 26:46 / 00:17 6 lb 14.6 oz (3.135 kg) M Vag-Spont Local N LIV     Birth Comments: none  1 SAB 2014            Past Surgical History: Past Surgical History:  Procedure Laterality Date  . HERNIA REPAIR      Family History: Family History   Problem Relation Age of Onset  . Hypertension Father   . Alzheimer's disease Maternal Grandmother   . Hypertension Mother   . Diabetes Paternal Uncle   . Diabetes Cousin     Social History: Social History   Tobacco Use  . Smoking status: Never Smoker  . Smokeless tobacco: Never Used  Substance Use Topics  . Alcohol use: No  . Drug use: No    Allergies: No Known Allergies  Meds:  Medications Prior to Admission  Medication Sig Dispense Refill Last Dose  . levETIRAcetam (KEPPRA) 500 MG tablet Take 500 mg by mouth 2 (two) times daily.   12/29/2017 at 1000    ROS:  Review of Systems  Constitutional: Negative.   Respiratory: Negative.   Cardiovascular: Negative.   Gastrointestinal: Negative.   Genitourinary: Negative.   Neurological: Positive for headaches. Negative for dizziness, weakness and light-headedness.   I have reviewed patient's Past Medical Hx, Surgical Hx, Family Hx, Social Hx, medications and allergies.   Physical Exam   Patient Vitals for the past 24 hrs:  BP Temp Pulse Resp SpO2 Height Weight  12/29/17 2150 108/71 98.3  F (36.8 C) 93 18 100 % 5\' 5"  (1.651 m) 206 lb (93.4 kg)   Constitutional: Well-developed, obese female in no acute distress.  Cardiovascular: normal rate Respiratory: normal effort GI: Abd soft, non-tender, gravid appropriate for gestational age.  MS: Extremities nontender, no edema, normal ROM Neurologic: Alert and oriented x 4.  GU: Neg CVAT. PELVIC EXAM: deferred   FHT:  Baseline 135 , moderate variability, accelerations present, no decelerations Contractions: none   Labs: Results for orders placed or performed during the hospital encounter of 12/29/17 (from the past 24 hour(s))  Urinalysis, Routine w reflex microscopic     Status: Abnormal   Collection Time: 12/29/17 10:00 PM  Result Value Ref Range   Color, Urine YELLOW YELLOW   APPearance HAZY (A) CLEAR   Specific Gravity, Urine 1.025 1.005 - 1.030   pH 6.0 5.0 - 8.0    Glucose, UA NEGATIVE NEGATIVE mg/dL   Hgb urine dipstick NEGATIVE NEGATIVE   Bilirubin Urine NEGATIVE NEGATIVE   Ketones, ur 15 (A) NEGATIVE mg/dL   Protein, ur 30 (A) NEGATIVE mg/dL   Nitrite NEGATIVE NEGATIVE   Leukocytes, UA MODERATE (A) NEGATIVE  Protein / creatinine ratio, urine     Status: Abnormal   Collection Time: 12/29/17 10:00 PM  Result Value Ref Range   Creatinine, Urine 277.00 mg/dL   Total Protein, Urine 63 mg/dL   Protein Creatinine Ratio 0.23 (H) 0.00 - 0.15 mg/mg[Cre]  Urinalysis, Microscopic (reflex)     Status: Abnormal   Collection Time: 12/29/17 10:00 PM  Result Value Ref Range   RBC / HPF 0-5 0 - 5 RBC/hpf   WBC, UA 11-20 0 - 5 WBC/hpf   Bacteria, UA FEW (A) NONE SEEN   Squamous Epithelial / LPF 11-20 0 - 5   Mucus PRESENT   CBC     Status: Abnormal   Collection Time: 12/29/17 10:30 PM  Result Value Ref Range   WBC 8.8 4.0 - 10.5 K/uL   RBC 3.61 (L) 3.87 - 5.11 MIL/uL   Hemoglobin 9.9 (L) 12.0 - 15.0 g/dL   HCT 30.4 (L) 36.0 - 46.0 %   MCV 84.2 78.0 - 100.0 fL   MCH 27.4 26.0 - 34.0 pg   MCHC 32.6 30.0 - 36.0 g/dL   RDW 13.3 11.5 - 15.5 %   Platelets 228 150 - 400 K/uL   O/Positive/-- (11/13 1147)  MAU Course/MDM: Orders Placed This Encounter  Procedures  . Urinalysis, Routine w reflex microscopic  . CBC  . Comprehensive metabolic panel  . Protein / creatinine ratio, urine  . Urinalysis, Microscopic (reflex)  . Insert peripheral IV  . Discharge patient Discharge disposition: 01-Home or Self Care; Discharge patient date: 12/29/2017   Pre E labs- WNL, slight elevation of PCR @ 0.23 UA- 15 ketones present with 30 of protein    Meds ordered this encounter  Medications  . lactated ringers bolus 1,000 mL  . AND Linked Order Group   . diphenhydrAMINE (BENADRYL) injection 25 mg   . metoCLOPramide (REGLAN) injection 10 mg   . dexamethasone (DECADRON) injection 10 mg  . butalbital-acetaminophen-caffeine (FIORICET, ESGIC) 50-325-40 MG tablet     Sig: Take 1 tablet by mouth every 6 (six) hours as needed for headache.    Dispense:  20 tablet    Refill:  0    Order Specific Question:   Supervising Provider    Answer:   Jonnie Kind [[id number]]   NST reviewed- reactive  Treatments in MAU included  LR bolus IV, Headache cocktail including benadryl 25mg , reglan 10mg  and decadron 10mg  IV. Patient reports relief of HA after medication treatment in MAU, patient able to sleep and move with no increase in pain or return of pain. Rx for migraines sent to pharmacy of choice.     Pt discharge with strict preeclampsia and eclampsia precautions. Educated and discussed reasons to return to MAU. Patient verbalizes understanding.   Assessment: 1. Acute intractable tension-type headache   2. Encounter for supervision of other normal pregnancy in third trimester   3. Hx of absence seizures   4. [redacted] weeks gestation of pregnancy   5. Dehydration, mild   6. Proteinuria affecting pregnancy, antepartum, third trimester     Plan: Discharge home. Pt stable at time of discharge Labor precautions and fetal kick counts Rx for Fioricet for headache and migraines  Follow up as scheduled for prenatal appointments  Return to MAU as needed for emergencies  Educated on the need to follow up with neurology and take medication as prescribed.   Follow-up Information    [facility] Follow up.   Specialty:  Obstetrics and Gynecology Why:  Follow up as scheduled for prenatal appointments  Contact information: [place] [phone]          Allergies as of 12/29/2017   No Known Allergies     Medication List    TAKE these medications   butalbital-acetaminophen-caffeine 50-325-40 MG tablet Commonly known as:  FIORICET, ESGIC Take 1 tablet by mouth every 6 (six) hours as needed for headache.   levETIRAcetam 500 MG tablet Commonly known as:  KEPPRA Take 500 mg by mouth 2 (two) times daily.        Darrol Poke Certified Nurse-Midwife 12/30/2017 3:40 AM

## 2017-12-29 NOTE — MAU Note (Signed)
Has hx seizures since 10/18. Was to take Keppra but has not taken as prescribed. Last seizure in January 2019. Took one Keppra then and no seizures since. Today feels tension on right side of head and down neck. Took Keppra but not helping. Headache present when woke up this am. Occ ctx but "I always have ctxs"

## 2017-12-30 LAB — COMPREHENSIVE METABOLIC PANEL
ALT: 15 U/L (ref 14–54)
AST: 15 U/L (ref 15–41)
Albumin: 2.7 g/dL — ABNORMAL LOW (ref 3.5–5.0)
Alkaline Phosphatase: 107 U/L (ref 38–126)
Anion gap: 9 (ref 5–15)
BUN: 13 mg/dL (ref 6–20)
CO2: 19 mmol/L — ABNORMAL LOW (ref 22–32)
Calcium: 8.8 mg/dL — ABNORMAL LOW (ref 8.9–10.3)
Chloride: 106 mmol/L (ref 101–111)
Creatinine, Ser: 0.69 mg/dL (ref 0.44–1.00)
GFR calc Af Amer: >60 mL/min (ref >60)
GFR calc non Af Amer: >60 mL/min (ref >60)
Glucose, Bld: 105 mg/dL — ABNORMAL HIGH (ref 65–99)
Potassium: 3.8 mmol/L (ref 3.5–5.1)
Sodium: 134 mmol/L — ABNORMAL LOW (ref 135–145)
Total Bilirubin: 0.1 mg/dL — ABNORMAL LOW (ref 0.3–1.2)
Total Protein: 6.9 g/dL (ref 6.5–8.1)

## 2018-01-03 ENCOUNTER — Encounter: Payer: Self-pay | Admitting: Advanced Practice Midwife

## 2018-01-03 ENCOUNTER — Encounter: Payer: Self-pay | Admitting: Family Medicine

## 2018-01-03 ENCOUNTER — Ambulatory Visit (INDEPENDENT_AMBULATORY_CARE_PROVIDER_SITE_OTHER): Payer: Medicaid Other | Admitting: Advanced Practice Midwife

## 2018-01-03 VITALS — BP 100/60 | HR 98 | Wt 210.0 lb

## 2018-01-03 DIAGNOSIS — Z331 Pregnant state, incidental: Secondary | ICD-10-CM

## 2018-01-03 DIAGNOSIS — R801 Persistent proteinuria, unspecified: Secondary | ICD-10-CM

## 2018-01-03 DIAGNOSIS — O26843 Uterine size-date discrepancy, third trimester: Secondary | ICD-10-CM

## 2018-01-03 DIAGNOSIS — Z1389 Encounter for screening for other disorder: Secondary | ICD-10-CM

## 2018-01-03 DIAGNOSIS — Z3A36 36 weeks gestation of pregnancy: Secondary | ICD-10-CM

## 2018-01-03 DIAGNOSIS — Z3483 Encounter for supervision of other normal pregnancy, third trimester: Secondary | ICD-10-CM

## 2018-01-03 DIAGNOSIS — O9989 Other specified diseases and conditions complicating pregnancy, childbirth and the puerperium: Secondary | ICD-10-CM

## 2018-01-03 LAB — POCT URINALYSIS DIPSTICK
Glucose, UA: NEGATIVE
Ketones, UA: NEGATIVE
LEUKOCYTES UA: NEGATIVE
NITRITE UA: NEGATIVE
PROTEIN UA: POSITIVE — AB
RBC UA: NEGATIVE

## 2018-01-03 NOTE — Progress Notes (Signed)
  [id number] [redacted]w[redacted]d Estimated Date of Delivery: 01/27/18  Blood pressure 100/60, pulse 98, weight 210 lb (95.3 kg), last menstrual period 04/16/2017, not currently breastfeeding.   BP weight and urine results all reviewed and noted.  Please refer to the obstetrical flow sheet for the fundal height and fetal heart rate documentation:  Size still <dates. EFW 31% earlier this month. Will check again next week Patient reports good fetal movement, denies any bleeding and no rupture of membranes symptoms or regular contractions. Patient is without complaints.Deinies UTI sx All questions were answered.   Physical Assessment:   Vitals:   01/03/18 1111  BP: 100/60  Pulse: 98  Weight: 210 lb (95.3 kg)  Body mass index is 34.95 kg/m.        Physical Examination:   General appearance: Well appearing, and in no distress  Mental status: Alert, oriented to person, place, and time  Skin: Warm & dry  Cardiovascular: Normal heart rate noted  Respiratory: Normal respiratory effort, no distress  Abdomen: Soft, gravid, nontender  Pelvic: Cervical exam performed         Extremities: Edema: None  Fetal Status:     Movement: Present    Results for orders placed or performed in visit on 01/03/18 (from the past 24 hour(s))  POCT urinalysis dipstick   Collection Time: 01/03/18 11:17 AM  Result Value Ref Range   Color, UA     Clarity, UA     Glucose, UA Negative Negative   Bilirubin, UA     Ketones, UA neg    Spec Grav, UA  1.010 - 1.025   Blood, UA neg    pH, UA  5.0 - 8.0   Protein, UA Positive (A) Negative   Urobilinogen, UA  0.2 or 1.0 E.U./dL   Nitrite, UA neg    Leukocytes, UA Negative Negative   Appearance     Odor       Orders Placed This Encounter  Procedures  . Urine Culture  . Strep Gp B NAA  . GC/Chlamydia Probe Amp  . POCT urinalysis dipstick    Plan:  Continued routine obstetrical care,   Return in about 1 week (around 01/10/2018) for LROB, US:EFW.

## 2018-01-03 NOTE — Patient Instructions (Signed)

## 2018-01-05 LAB — STREP GP B NAA: Strep Gp B NAA: POSITIVE — AB

## 2018-01-05 LAB — URINE CULTURE

## 2018-01-08 LAB — GC/CHLAMYDIA PROBE AMP
Chlamydia trachomatis, NAA: NEGATIVE
Neisseria gonorrhoeae by PCR: NEGATIVE

## 2018-01-09 ENCOUNTER — Ambulatory Visit (INDEPENDENT_AMBULATORY_CARE_PROVIDER_SITE_OTHER): Payer: Medicaid Other | Admitting: Obstetrics and Gynecology

## 2018-01-09 ENCOUNTER — Encounter: Payer: Self-pay | Admitting: Obstetrics and Gynecology

## 2018-01-09 ENCOUNTER — Ambulatory Visit (INDEPENDENT_AMBULATORY_CARE_PROVIDER_SITE_OTHER): Payer: Medicaid Other

## 2018-01-09 VITALS — BP 110/70 | HR 98 | Wt 206.6 lb

## 2018-01-09 DIAGNOSIS — Z1389 Encounter for screening for other disorder: Secondary | ICD-10-CM

## 2018-01-09 DIAGNOSIS — D259 Leiomyoma of uterus, unspecified: Secondary | ICD-10-CM | POA: Diagnosis not present

## 2018-01-09 DIAGNOSIS — Z3483 Encounter for supervision of other normal pregnancy, third trimester: Secondary | ICD-10-CM

## 2018-01-09 DIAGNOSIS — O3413 Maternal care for benign tumor of corpus uteri, third trimester: Secondary | ICD-10-CM

## 2018-01-09 DIAGNOSIS — O9989 Other specified diseases and conditions complicating pregnancy, childbirth and the puerperium: Secondary | ICD-10-CM | POA: Diagnosis not present

## 2018-01-09 DIAGNOSIS — Z3A37 37 weeks gestation of pregnancy: Secondary | ICD-10-CM | POA: Diagnosis not present

## 2018-01-09 DIAGNOSIS — R569 Unspecified convulsions: Secondary | ICD-10-CM | POA: Diagnosis not present

## 2018-01-09 DIAGNOSIS — O26843 Uterine size-date discrepancy, third trimester: Secondary | ICD-10-CM | POA: Diagnosis not present

## 2018-01-09 DIAGNOSIS — Z331 Pregnant state, incidental: Secondary | ICD-10-CM

## 2018-01-09 LAB — POCT URINALYSIS DIPSTICK
Glucose, UA: NEGATIVE
LEUKOCYTES UA: NEGATIVE
Nitrite, UA: NEGATIVE
Protein, UA: POSITIVE — AB
RBC UA: NEGATIVE

## 2018-01-09 NOTE — Progress Notes (Signed)
   LOW-RISK PREGNANCY VISIT Patient name: Joan Andrews MRN [id number]  Date of birth: [birth year] Chief Complaint:   low risk ob (ultrasound- EFW)  History of Present Illness:   Joan Andrews is a 22 y.o. G23P1011 female at [redacted]w[redacted]d with an Estimated Date of Delivery: 01/27/18 being seen today for ongoing management of a low-risk pregnancy.  Today she reports no complaints. Contractions: Regular.  .  Movement: Present. denies leaking of fluid. Review of Systems:   Pertinent items are noted in HPI Denies abnormal vaginal discharge w/ itching/odor/irritation, headaches, visual changes, shortness of breath, chest pain, abdominal pain, severe nausea/vomiting, or problems with urination or bowel movements unless otherwise stated above. Pertinent History Reviewed:  Reviewed past medical,surgical, social, obstetrical and family history.  Reviewed problem list, medications and allergies. Physical Assessment:   Vitals:   01/09/18 1445  BP: 110/70  Pulse: 98  Weight: 206 lb 9.6 oz (93.7 kg)  Body mass index is 34.38 kg/m.        Physical Examination:   General appearance: Well appearing, and in no distress  Mental status: Alert, oriented to person, place, and time  Skin: Warm & dry  Cardiovascular: Normal heart rate noted  Respiratory: Normal respiratory effort, no distress  Abdomen: Soft, gravid, nontender  Pelvic: Cervical exam deferred         Extremities: Edema: Trace  Fetal Status: Fetal Heart Rate (bpm): 146 Fundal Height: 35 cm Movement: Present    Results for orders placed or performed in visit on 01/09/18 (from the past 24 hour(s))  POCT urinalysis dipstick   Collection Time: 01/09/18  2:48 PM  Result Value Ref Range   Color, UA     Clarity, UA     Glucose, UA Negative Negative   Bilirubin, UA     Ketones, UA trace    Spec Grav, UA  1.010 - 1.025   Blood, UA neg    pH, UA  5.0 - 8.0   Protein, UA Positive (A) Negative   Urobilinogen, UA  0.2 or 1.0 E.U./dL     Nitrite, UA neg    Leukocytes, UA Negative Negative   Appearance     Odor      Assessment & Plan:  1) Low-risk pregnancy G3P1011 at [redacted]w[redacted]d with an Estimated Date of Delivery: 01/27/18      Meds: No orders of the defined types were placed in this encounter.  Labs/procedures today: [place] 67+2 wks,cephalic,fhr 094 bpm,afi 12 cm,bilat adnexa's wnl,posterior pl gr 0,EFW 2985 g 37%  Plan:  Continue routine obstetrical care  Follow-up: Return in about 1 week (around 01/16/2018).  Orders Placed This Encounter  Procedures  . POCT urinalysis dipstick   By signing my name below, I, Samul Dada, attest that this documentation has been prepared under the direction and in the presence of Jonnie Kind, MD. Electronically Signed: [place]. 01/09/18. 2:55 PM.  I personally performed the services described in this documentation, which was SCRIBED in my presence. The recorded information has been reviewed and considered accurate. It has been edited as necessary during review. Jonnie Kind, MD

## 2018-01-09 NOTE — Progress Notes (Signed)
[place] 56+2 wks,cephalic,fhr 563 bpm,afi 12 cm,bilat adnexa's wnl,posterior pl gr 0,EFW 2985 g 37%

## 2018-01-10 ENCOUNTER — Encounter: Payer: Self-pay | Admitting: Advanced Practice Midwife

## 2018-01-17 ENCOUNTER — Encounter: Payer: Medicaid Other | Admitting: Women's Health

## 2018-01-18 ENCOUNTER — Ambulatory Visit (INDEPENDENT_AMBULATORY_CARE_PROVIDER_SITE_OTHER): Payer: Medicaid Other | Admitting: Women's Health

## 2018-01-18 ENCOUNTER — Encounter: Payer: Self-pay | Admitting: Women's Health

## 2018-01-18 ENCOUNTER — Other Ambulatory Visit: Payer: Self-pay

## 2018-01-18 VITALS — BP 109/68 | HR 104 | Wt 206.0 lb

## 2018-01-18 DIAGNOSIS — Z3483 Encounter for supervision of other normal pregnancy, third trimester: Secondary | ICD-10-CM

## 2018-01-18 DIAGNOSIS — Z3A38 38 weeks gestation of pregnancy: Secondary | ICD-10-CM

## 2018-01-18 DIAGNOSIS — Z1389 Encounter for screening for other disorder: Secondary | ICD-10-CM

## 2018-01-18 DIAGNOSIS — Z331 Pregnant state, incidental: Secondary | ICD-10-CM

## 2018-01-18 LAB — POCT URINALYSIS DIPSTICK
GLUCOSE UA: NEGATIVE
Ketones, UA: NEGATIVE
NITRITE UA: NEGATIVE
PROTEIN UA: NEGATIVE
RBC UA: NEGATIVE

## 2018-01-18 NOTE — Patient Instructions (Signed)
Joan Andrews, I greatly value your feedback.  If you receive a survey following your visit with [place] today, we appreciate you taking the time to fill it out.  Thanks, Knute Neu, CNM, WHNP-BC   Call the office [phone]) or go to [facility] if:  You begin to have strong, frequent contractions  Your water breaks.  Sometimes it is a big gush of fluid, sometimes it is just a trickle that keeps getting your panties wet or running down your legs  You have vaginal bleeding.  It is normal to have a small amount of spotting if your cervix was checked.   You don't feel your baby moving like normal.  If you don't, get you something to eat and drink and lay down and focus on feeling your baby move.  You should feel at least 10 movements in 2 hours.  If you don't, you should call the office or go to [place] Contractions Contractions of the uterus can occur throughout pregnancy, but they are not always a sign that you are in labor. You may have practice contractions called Braxton Hicks contractions. These false labor contractions are sometimes confused with true labor. What are [place] contractions? Braxton Hicks contractions are tightening movements that occur in the muscles of the uterus before labor. Unlike true labor contractions, these contractions do not result in opening (dilation) and thinning of the cervix. Toward the end of pregnancy (32-34 weeks), Braxton Hicks contractions can happen more often and may become stronger. These contractions are sometimes difficult to tell apart from true labor because they can be very uncomfortable. You should not feel embarrassed if you go to the hospital with false labor. Sometimes, the only way to tell if you are in true labor is for your health care provider to look for changes in the cervix. The health care provider will do a physical exam and may monitor your contractions. If you are not in true labor, the exam  should show that your cervix is not dilating and your water has not broken. If there are other health problems associated with your pregnancy, it is completely safe for you to be sent home with false labor. You may continue to have Braxton Hicks contractions until you go into true labor. How to tell the difference between true labor and false labor True labor  Contractions last 30-70 seconds.  Contractions become very regular.  Discomfort is usually felt in the top of the uterus, and it spreads to the lower abdomen and low back.  Contractions do not go away with walking.  Contractions usually become more intense and increase in frequency.  The cervix dilates and gets thinner. False labor  Contractions are usually shorter and not as strong as true labor contractions.  Contractions are usually irregular.  Contractions are often felt in the front of the lower abdomen and in the groin.  Contractions may go away when you walk around or change positions while lying down.  Contractions get weaker and are shorter-lasting as time goes on.  The cervix usually does not dilate or become thin. Follow these instructions at home:  Take over-the-counter and prescription medicines only as told by your health care provider.  Keep up with your usual exercises and follow other instructions from your health care provider.  Eat and drink lightly if you think you are going into labor.  If Braxton Hicks contractions are making you uncomfortable: ? Change your position from lying  down or resting to walking, or change from walking to resting. ? Sit and rest in a tub of warm water. ? Drink enough fluid to keep your urine pale yellow. Dehydration may cause these contractions. ? Do slow and deep breathing several times an hour.  Keep all follow-up prenatal visits as told by your health care provider. This is important. Contact a health care provider if:  You have a fever.  You have continuous pain  in your abdomen. Get help right away if:  Your contractions become stronger, more regular, and closer together.  You have fluid leaking or gushing from your vagina.  You pass blood-tinged mucus (bloody show).  You have bleeding from your vagina.  You have low back pain that you never had before.  You feel your baby's head pushing down and causing pelvic pressure.  Your baby is not moving inside you as much as it used to. Summary  Contractions that occur before labor are called Braxton Hicks contractions, false labor, or practice contractions.  Braxton Hicks contractions are usually shorter, weaker, farther apart, and less regular than true labor contractions. True labor contractions usually become progressively stronger and regular and they become more frequent.  Manage discomfort from [facility] contractions by changing position, resting in a warm bath, drinking plenty of water, or practicing deep breathing. This information is not intended to replace advice given to you by your health care provider. Make sure you discuss any questions you have with your health care provider. Document Released: 12/14/2016 Document Revised: 12/14/2016 Document Reviewed: 12/14/2016 Elsevier Interactive Patient Education  2018 [employer].

## 2018-01-18 NOTE — Progress Notes (Signed)
   LOW-RISK PREGNANCY VISIT Patient name: Joan Andrews MRN [id number]  Date of birth: May 12, 1996 Chief Complaint:   Routine Prenatal Visit  History of Present Illness:   Joan Andrews is a 22 y.o. G77P1011 female at [redacted]w[redacted]d with an Estimated Date of Delivery: 01/27/18 being seen today for ongoing management of a low-risk pregnancy.  Today she reports no complaints. Contractions: Irregular. Vag. Bleeding: None.  Movement: Present. denies leaking of fluid. Review of Systems:   Pertinent items are noted in HPI Denies abnormal vaginal discharge w/ itching/odor/irritation, headaches, visual changes, shortness of breath, chest pain, abdominal pain, severe nausea/vomiting, or problems with urination or bowel movements unless otherwise stated above. Pertinent History Reviewed:  Reviewed past medical,surgical, social, obstetrical and family history.  Reviewed problem list, medications and allergies. Physical Assessment:   Vitals:   01/18/18 1303  BP: 109/68  Pulse: (!) 104  Weight: 206 lb (93.4 kg)  Body mass index is 34.28 kg/m.        Physical Examination:   General appearance: Well appearing, and in no distress  Mental status: Alert, oriented to person, place, and time  Skin: Warm & dry  Cardiovascular: Normal heart rate noted  Respiratory: Normal respiratory effort, no distress  Abdomen: Soft, gravid, nontender  Pelvic: Cervical exam performed  Dilation: 3 Effacement (%): 50 Station: -2  Extremities: Edema: None  Fetal Status: Fetal Heart Rate (bpm): 140 Fundal Height: 36 cm Movement: Present Presentation: Vertex  Results for orders placed or performed in visit on 01/18/18 (from the past 24 hour(s))  POCT urinalysis dipstick   Collection Time: 01/18/18  1:04 PM  Result Value Ref Range   Color, UA     Clarity, UA     Glucose, UA Negative Negative   Bilirubin, UA     Ketones, UA neg    Spec Grav, UA  1.010 - 1.025   Blood, UA neg    pH, UA  5.0 - 8.0   Protein,  UA Negative Negative   Urobilinogen, UA  0.2 or 1.0 E.U./dL   Nitrite, UA neg    Leukocytes, UA Small (1+) (A) Negative   Appearance     Odor      Assessment & Plan:  1) Low-risk pregnancy G3P1011 at [redacted]w[redacted]d with an Estimated Date of Delivery: 01/27/18    Meds: No orders of the defined types were placed in this encounter.  Labs/procedures today: sve  Plan:  Continue routine obstetrical care   Reviewed: Term labor symptoms and general obstetric precautions including but not limited to vaginal bleeding, contractions, leaking of fluid and fetal movement were reviewed in detail with the patient.  All questions were answered  Follow-up: Return in about 1 week (around 01/25/2018) for [place].  Orders Placed This Encounter  Procedures  . POCT urinalysis dipstick   Roma Schanz CNM, [facility] 01/18/2018 1:16 PM

## 2018-01-22 ENCOUNTER — Encounter (HOSPITAL_COMMUNITY): Payer: Self-pay

## 2018-01-22 ENCOUNTER — Telehealth: Payer: Self-pay | Admitting: *Deleted

## 2018-01-22 ENCOUNTER — Inpatient Hospital Stay (HOSPITAL_COMMUNITY)
Admission: AD | Admit: 2018-01-22 | Discharge: 2018-01-24 | DRG: 807 | Disposition: A | Discharge status: Home / Self Care | Payer: Medicaid Other | Attending: Obstetrics and Gynecology | Admitting: Obstetrics and Gynecology

## 2018-01-22 DIAGNOSIS — Z3A39 39 weeks gestation of pregnancy: Secondary | ICD-10-CM

## 2018-01-22 DIAGNOSIS — O99824 Streptococcus B carrier state complicating childbirth: Secondary | ICD-10-CM | POA: Diagnosis present

## 2018-01-22 DIAGNOSIS — Z3483 Encounter for supervision of other normal pregnancy, third trimester: Secondary | ICD-10-CM

## 2018-01-22 DIAGNOSIS — Z283 Underimmunization status: Secondary | ICD-10-CM

## 2018-01-22 DIAGNOSIS — O09899 Supervision of other high risk pregnancies, unspecified trimester: Secondary | ICD-10-CM

## 2018-01-22 DIAGNOSIS — Z3490 Encounter for supervision of normal pregnancy, unspecified, unspecified trimester: Secondary | ICD-10-CM

## 2018-01-22 LAB — CBC
HCT: 30.2 % — ABNORMAL LOW (ref 36.0–46.0)
Hemoglobin: 10.2 g/dL — ABNORMAL LOW (ref 12.0–15.0)
MCH: 27.1 pg (ref 26.0–34.0)
MCHC: 33.8 g/dL (ref 30.0–36.0)
MCV: 80.3 fL (ref 78.0–100.0)
Platelets: 234 K/uL (ref 150–400)
RBC: 3.76 MIL/uL — ABNORMAL LOW (ref 3.87–5.11)
RDW: 14.1 % (ref 11.5–15.5)
WBC: 8.4 K/uL (ref 4.0–10.5)

## 2018-01-22 LAB — RPR: RPR: NONREACTIVE

## 2018-01-22 LAB — TYPE AND SCREEN
ABO/RH(D): O POS
Antibody Screen: NEGATIVE

## 2018-01-22 MED ORDER — FENTANYL CITRATE (PF) 100 MCG/2ML IJ SOLN
INTRAMUSCULAR | Status: AC
  Administered 2018-01-22: 100 ug
  Filled 2018-01-22: qty 2

## 2018-01-22 MED ORDER — ZOLPIDEM TARTRATE 5 MG PO TABS: 5.0000 mg | ORAL_TABLET | Freq: Every evening | ORAL | Status: DC | PRN

## 2018-01-22 MED ORDER — OXYCODONE-ACETAMINOPHEN 5-325 MG PO TABS: 1.0000 | ORAL_TABLET | ORAL | Status: DC | PRN

## 2018-01-22 MED ORDER — WITCH HAZEL-GLYCERIN EX PADS: 1.0000 "application " | MEDICATED_PAD | CUTANEOUS | Status: DC | PRN

## 2018-01-22 MED ORDER — PHENYLEPHRINE 40 MCG/ML (10ML) SYRINGE FOR IV PUSH (FOR BLOOD PRESSURE SUPPORT): 80.0000 ug | PREFILLED_SYRINGE | INTRAVENOUS | Status: DC | PRN

## 2018-01-22 MED ORDER — DIPHENHYDRAMINE HCL 25 MG PO CAPS: 25.0000 mg | ORAL_CAPSULE | Freq: Four times a day (QID) | ORAL | Status: DC | PRN

## 2018-01-22 MED ORDER — ONDANSETRON HCL 4 MG PO TABS: 4.0000 mg | ORAL_TABLET | ORAL | Status: DC | PRN

## 2018-01-22 MED ORDER — PRENATAL MULTIVITAMIN CH
1.0000 | ORAL_TABLET | Freq: Every day | ORAL | Status: DC
  Administered 2018-01-22 – 2018-01-24 (×3): 1 via ORAL
  Filled 2018-01-22 (×3): qty 1

## 2018-01-22 MED ORDER — SIMETHICONE 80 MG PO CHEW: 80.0000 mg | CHEWABLE_TABLET | ORAL | Status: DC | PRN

## 2018-01-22 MED ORDER — BENZOCAINE-MENTHOL 20-0.5 % EX AERO
1.0000 "application " | INHALATION_SPRAY | CUTANEOUS | Status: DC | PRN
  Administered 2018-01-22: 1 via TOPICAL
  Filled 2018-01-22: qty 56

## 2018-01-22 MED ORDER — LACTATED RINGERS IV SOLN: 500.0000 mL | Freq: Once | INTRAVENOUS | Status: DC

## 2018-01-22 MED ORDER — FENTANYL CITRATE (PF) 100 MCG/2ML IJ SOLN
INTRAMUSCULAR | Status: AC
  Filled 2018-01-22: qty 2

## 2018-01-22 MED ORDER — COCONUT OIL OIL: 1.0000 "application " | TOPICAL_OIL | Status: DC | PRN

## 2018-01-22 MED ORDER — TETANUS-DIPHTH-ACELL PERTUSSIS 5-2.5-18.5 LF-MCG/0.5 IM SUSP: 0.5000 mL | Freq: Once | INTRAMUSCULAR | Status: DC

## 2018-01-22 MED ORDER — EPHEDRINE 5 MG/ML INJ: 10.0000 mg | INTRAVENOUS | Status: DC | PRN

## 2018-01-22 MED ORDER — IBUPROFEN 600 MG PO TABS
600.0000 mg | ORAL_TABLET | Freq: Four times a day (QID) | ORAL | Status: DC
  Administered 2018-01-22 – 2018-01-24 (×9): 600 mg via ORAL
  Filled 2018-01-22 (×9): qty 1

## 2018-01-22 MED ORDER — SENNOSIDES-DOCUSATE SODIUM 8.6-50 MG PO TABS
2.0000 | ORAL_TABLET | ORAL | Status: DC
  Administered 2018-01-22 – 2018-01-23 (×2): 2 via ORAL
  Filled 2018-01-22 (×2): qty 2

## 2018-01-22 MED ORDER — BENZOCAINE-MENTHOL 20-0.5 % EX AERO: 1.0000 "application " | INHALATION_SPRAY | CUTANEOUS | Status: DC | PRN

## 2018-01-22 MED ORDER — DIPHENHYDRAMINE HCL 50 MG/ML IJ SOLN: 12.5000 mg | INTRAMUSCULAR | Status: DC | PRN

## 2018-01-22 MED ORDER — OXYCODONE-ACETAMINOPHEN 5-325 MG PO TABS: 2.0000 | ORAL_TABLET | ORAL | Status: DC | PRN

## 2018-01-22 MED ORDER — DIBUCAINE 1 % RE OINT: 1.0000 "application " | TOPICAL_OINTMENT | RECTAL | Status: DC | PRN

## 2018-01-22 MED ORDER — OXYTOCIN BOLUS FROM INFUSION
500.0000 mL | Freq: Once | INTRAVENOUS | Status: AC
  Administered 2018-01-22: 500 mL via INTRAVENOUS

## 2018-01-22 MED ORDER — ACETAMINOPHEN 325 MG PO TABS: 650.0000 mg | ORAL_TABLET | ORAL | Status: DC | PRN

## 2018-01-22 MED ORDER — FENTANYL 2.5 MCG/ML BUPIVACAINE 1/10 % EPIDURAL INFUSION (WH - ANES): 14.0000 mL/h | INTRAMUSCULAR | Status: DC | PRN

## 2018-01-22 MED ORDER — ONDANSETRON HCL 4 MG/2ML IJ SOLN: 4.0000 mg | INTRAMUSCULAR | Status: DC | PRN

## 2018-01-22 MED ORDER — LACTATED RINGERS IV SOLN: INTRAVENOUS | Status: DC

## 2018-01-22 MED ORDER — ACETAMINOPHEN 325 MG PO TABS
650.0000 mg | ORAL_TABLET | ORAL | Status: DC | PRN
  Administered 2018-01-22 – 2018-01-23 (×3): 650 mg via ORAL
  Filled 2018-01-22 (×3): qty 2

## 2018-01-22 MED ORDER — LEVETIRACETAM 500 MG PO TABS
500.0000 mg | ORAL_TABLET | Freq: Two times a day (BID) | ORAL | Status: DC
  Administered 2018-01-22 – 2018-01-24 (×5): 500 mg via ORAL
  Filled 2018-01-22 (×5): qty 1

## 2018-01-22 MED ORDER — FLEET ENEMA 7-19 GM/118ML RE ENEM: 1.0000 | ENEMA | RECTAL | Status: DC | PRN

## 2018-01-22 MED ORDER — LIDOCAINE HCL (PF) 1 % IJ SOLN
30.0000 mL | INTRAMUSCULAR | Status: AC | PRN
  Administered 2018-01-22: 30 mL via SUBCUTANEOUS
  Filled 2018-01-22: qty 30

## 2018-01-22 MED ORDER — SOD CITRATE-CITRIC ACID 500-334 MG/5ML PO SOLN: 30.0000 mL | ORAL | Status: DC | PRN

## 2018-01-22 MED ORDER — LACTATED RINGERS IV SOLN: 500.0000 mL | INTRAVENOUS | Status: DC | PRN

## 2018-01-22 MED ORDER — PRENATAL MULTIVITAMIN CH: 1.0000 | ORAL_TABLET | Freq: Every day | ORAL | Status: DC

## 2018-01-22 MED ORDER — SODIUM CHLORIDE 0.9 % IV SOLN
2.0000 g | Freq: Once | INTRAVENOUS | Status: AC
  Administered 2018-01-22: 2 g via INTRAVENOUS
  Filled 2018-01-22: qty 2

## 2018-01-22 MED ORDER — IBUPROFEN 600 MG PO TABS: 600.0000 mg | ORAL_TABLET | Freq: Four times a day (QID) | ORAL | Status: DC

## 2018-01-22 MED ORDER — FENTANYL CITRATE (PF) 100 MCG/2ML IJ SOLN
100.0000 ug | INTRAMUSCULAR | Status: DC | PRN
  Administered 2018-01-22: 100 ug via INTRAVENOUS

## 2018-01-22 MED ORDER — SENNOSIDES-DOCUSATE SODIUM 8.6-50 MG PO TABS: 2.0000 | ORAL_TABLET | ORAL | Status: DC

## 2018-01-22 MED ORDER — OXYTOCIN 40 UNITS IN LACTATED RINGERS INFUSION - SIMPLE MED
2.5000 [IU]/h | INTRAVENOUS | Status: DC
  Filled 2018-01-22: qty 1000

## 2018-01-22 MED ORDER — ONDANSETRON HCL 4 MG/2ML IJ SOLN: 4.0000 mg | Freq: Four times a day (QID) | INTRAMUSCULAR | Status: DC | PRN

## 2018-01-22 MED ORDER — FENTANYL CITRATE (PF) 100 MCG/2ML IJ SOLN: 100.0000 ug | Freq: Once | INTRAMUSCULAR | Status: DC

## 2018-01-22 NOTE — H&P (Addendum)
LABOR AND DELIVERY ADMISSION HISTORY AND PHYSICAL NOTE  Joan Andrews is a 22 y.o. female G30P1011 with IUP at [redacted]w[redacted]d by 7 wk U/S presenting for spontaneous labor. She started feeling regular contractions around 2300 last night. She reports positive fetal movement. She denies leakage of fluid or vaginal bleeding.  Prenatal History/Complications: PNC at [facility] Pregnancy complications:  - GBS positive - varicella non-immune - ? Possible seizures in first trimester; s/p consult with Dr. Merlene Laughter, EEG and MRI normal, started on Keppra although not taking  Past Medical History: Past Medical History:  Diagnosis Date  . Bronchitis    as a child only  . Contraceptive management 12/26/2013  . Miscarriage   . Pregnant 07/14/2014  . Recurrent tonsillitis   . Seizures ([place])    Questionable  . Supervision of normal pregnancy in first trimester 07/27/2015    [place] [facility] at   7+5 weeks FOB  Joan Andrews 23 yo bm Dating By  LMP and [place] Pap  07/27/15 GC/CT Initial:                36+wks: Genetic Screen NT/IT:  CF screen  Anatomic [place]  Flu vaccine  Tdap Recommended ~ 28wks Glucose Screen  2 hr GBS  Feed Preference  Contraception  Circumcision  Childbirth Classes  Pediatrician      Past Surgical History: Past Surgical History:  Procedure Laterality Date  . HERNIA REPAIR      Obstetrical History: OB History    Gravida  3   Para  1   Term  1   Preterm      AB  1   Living  1     SAB  1   TAB      Ectopic      Multiple  0   Live Births  1           Social History: Social History   Socioeconomic History  . Marital status: Single    Spouse name: Not on file  . Number of children: Not on file  . Years of education: Not on file  . Highest education level: Not on file  Occupational History  . Not on file  Social Needs  . Financial resource strain: Not on file  . Food insecurity:    Worry: Not on file    Inability: Not on file  .  Transportation needs:    Medical: Not on file    Non-medical: Not on file  Tobacco Use  . Smoking status: Never Smoker  . Smokeless tobacco: Never Used  Substance and Sexual Activity  . Alcohol use: No  . Drug use: No  . Sexual activity: Yes    Partners: Male    Birth control/protection: None  Lifestyle  . Physical activity:    Days per week: Not on file    Minutes per session: Not on file  . Stress: Not on file  Relationships  . Social connections:    Talks on phone: Not on file    Gets together: Not on file    Attends religious service: Not on file    Active member of club or organization: Not on file    Attends meetings of clubs or organizations: Not on file    Relationship status: Not on file  Other Topics Concern  . Not on file  Social History Narrative  . Not on file    Family History: Family History  Problem Relation Age of Onset  .  Hypertension Father   . Alzheimer's disease Maternal Grandmother   . Hypertension Mother   . Diabetes Paternal Uncle   . Diabetes Cousin     Allergies: No Known Allergies  Medications Prior to Admission  Medication Sig Dispense Refill Last Dose  . butalbital-acetaminophen-caffeine (FIORICET, ESGIC) 50-325-40 MG tablet Take 1 tablet by mouth every 6 (six) hours as needed for headache. 20 tablet 0 Taking  . levETIRAcetam (KEPPRA) 500 MG tablet Take 500 mg by mouth 2 (two) times daily.   Taking     Review of Systems  All systems reviewed and negative except as stated in HPI  Physical Exam Blood pressure 112/72, pulse 79, temperature 97.9 F (36.6 C), temperature source Oral, resp. rate 17, last menstrual period 04/16/2017, not currently breastfeeding. General appearance: alert, oriented, NAD Lungs: normal respiratory effort Heart: regular rate Abdomen: soft, non-tender; gravid, FH appropriate for GA Extremities: No calf swelling or tenderness Presentation: cephalic Fetal monitoring: baseline 135 bpm, moderate variability,  + accels, no decels Uterine activity: q3-5 mins Dilation: 5.5 Effacement (%): 90 Station: -2 Exam by:: Delena Andrews, RNC  Prenatal labs: ABO, Rh: O/Positive/-- (11/13 1147) Antibody: Negative (03/20 0910) Rubella: 2.30 (11/13 1147) RPR: Non Reactive (03/20 0910)  HBsAg: Negative (11/13 1147)  HIV: Non Reactive (03/20 0910)  GC/Chlamydia: negative GBS: Positive (05/23 1400)  2-hr GTT: negative (81/129/69) Genetic screening:  Integrated screen negative Anatomy US: normal female  Prenatal Transfer Tool  Maternal Diabetes: No Genetic Screening: Normal Maternal Ultrasounds/Referrals: Normal Fetal Ultrasounds or other Referrals:  None Maternal Substance Abuse:  No Significant Maternal Medications:  Meds include: Other: Keppra Significant Maternal Lab Results: None  Results for orders placed or performed during the hospital encounter of 01/22/18 (from the past 24 hour(s))  CBC   Collection Time: 01/22/18  3:00 AM  Result Value Ref Range   WBC 8.4 4.0 - 10.5 K/uL   RBC 3.76 (L) 3.87 - 5.11 MIL/uL   Hemoglobin 10.2 (L) 12.0 - 15.0 g/dL   HCT 30.2 (L) 36.0 - 46.0 %   MCV 80.3 78.0 - 100.0 fL   MCH 27.1 26.0 - 34.0 pg   MCHC 33.8 30.0 - 36.0 g/dL   RDW 14.1 11.5 - 15.5 %   Platelets 234 150 - 400 K/uL    Patient Active Problem List   Diagnosis Date Noted  . Indication for care in labor and delivery, antepartum 01/22/2018  . Normal labor 01/22/2018  . Seizure-like activity ([place]) 10/03/2017  . Uterine fibroid 07/24/2017  . Susceptible to varicella (non-immune), currently pregnant 06/27/2017  . Supervision of normal pregnancy 06/26/2017  . History of chlamydia 02/16/2016    Assessment: Joan Andrews Comes is a 22 y.o. G3P1011 at [redacted]w[redacted]d here for spontaneous labor.  #Labor: SVE on admission 5.5/90/-2. Expectant management for now. Plan AROM when appropriate. Anticipate SVD.  #Questionable seizure activity in 1st trimester: per Neuro notes MRI and EEG normal however  patient is prescribed Keppra 500 mg BID.  #Varicella non-immune: needs vaccination postpartum  #Pain: Per patient request #FWB: Cat 1 FHT #ID:  GBS positive, amp ordered and started #MOF: breast #MOC:undecided #Circ:  N/a, girl  [place] 01/22/2018, 3:26 AM  OB FELLOW HISTORY AND PHYSICAL ATTESTATION  I have seen and examined this patient; I agree with above documentation in the resident's note.    Gailen Shelter, MD OB Fellow 01/22/2018

## 2018-01-22 NOTE — MAU Note (Signed)
CTX 3-5 minutes apart.  No LOF/VB.  + FM.  GBS+ 3 cm last week.

## 2018-01-22 NOTE — Telephone Encounter (Signed)
Lmom for pt to call us back to set up postpartum appointment.  01-22-18  AS

## 2018-01-23 ENCOUNTER — Other Ambulatory Visit: Payer: Self-pay

## 2018-01-23 NOTE — Plan of Care (Signed)
  Problem: Education: Goal: Knowledge of condition will improve Outcome: Progressing  Reviewed breast care for the non-breastfeeding mother, including wearing a bra, decreasing stimulation to nipples, and possible use of cabbage leaves.  Also reviewed the importance of emptying the bladder frequently and changing pads frequently.  Pt reports cramping pain in her lower abdomen of 7/10 for which she refuses medication at this time.  She is using heat to decrease the pain.   Pt verbalized understanding of all teaching.

## 2018-01-23 NOTE — Progress Notes (Signed)
POSTPARTUM PROGRESS NOTE  Post Partum Day 1 Subjective:  Joan Andrews is a 22 y.o. [id number] [redacted]w[redacted]d s/p SVD.  No acute events overnight.  Pt denies problems with ambulating, voiding or po intake.  She denies nausea or vomiting.  Pain is well controlled.  She has had flatus. She has not had bowel movement.  Lochia Minimal. Denies HA, SOB, CP, dizziness, vision change, abd pain.  Objective: Blood pressure (!) 101/57, pulse 61, temperature 98.4 F (36.9 C), temperature source Oral, resp. rate 20, last menstrual period 04/16/2017, SpO2 98 %, unknown if currently breastfeeding.  Physical Exam:  General: alert, cooperative and no distress Lochia:normal flow Chest: CTAB Heart: RRR no m/r/g Abdomen: +BS, soft, nontender,  Uterine Fundus: firm, non-tender DVT Evaluation: No calf swelling or tenderness Extremities: no evidence of LE edema  Recent Labs    01/22/18 0300  HGB 10.2*  HCT 30.2*    Assessment/Plan:  ASSESSMENT: Joan Andrews is a 22 y.o. G2X5284 [redacted]w[redacted]d s/p SVD  Plan for discharge tomorrow Breastfeeding without issue Contraception : undecided   LOS: 1 day   Charlies Silvers, Medical Student 01/23/2018, 7:47 AM

## 2018-01-24 MED ORDER — IBUPROFEN 600 MG PO TABS: 600.0000 mg | ORAL_TABLET | Freq: Four times a day (QID) | ORAL | 0 refills | Status: DC

## 2018-01-24 NOTE — Discharge Instructions (Signed)
Postpartum Care After Vaginal Delivery °The period of time right after you deliver your newborn is called the postpartum period. °What kind of medical care will I receive? °· You may continue to receive fluids and medicines through an IV tube inserted into one of your veins. °· If an incision was made near your vagina (episiotomy) or if you had some vaginal tearing during delivery, cold compresses may be placed on your episiotomy or your tear. This helps to reduce pain and swelling. °· You may be given a squirt bottle to use when you go to the bathroom. You may use this until you are comfortable wiping as usual. To use the squirt bottle, follow these steps: °? Before you urinate, fill the squirt bottle with warm water. Do not use hot water. °? After you urinate, while you are sitting on the toilet, use the squirt bottle to rinse the area around your urethra and vaginal opening. This rinses away any urine and blood. °? You may do this instead of wiping. As you start healing, you may use the squirt bottle before wiping yourself. Make sure to wipe gently. °? Fill the squirt bottle with clean water every time you use the bathroom. °· You will be given sanitary pads to wear. °How can I expect to feel? °· You may not feel the need to urinate for several hours after delivery. °· You will have some soreness and pain in your abdomen and vagina. °· If you are breastfeeding, you may have uterine contractions every time you breastfeed for up to several weeks postpartum. Uterine contractions help your uterus return to its normal size. °· It is normal to have vaginal bleeding (lochia) after delivery. The amount and appearance of lochia is often similar to a menstrual period in the first week after delivery. It will gradually decrease over the next few weeks to a dry, yellow-brown discharge. For most women, lochia stops completely by 6-8 weeks after delivery. Vaginal bleeding can vary from woman to woman. °· Within the first few  days after delivery, you may have breast engorgement. This is when your breasts feel heavy, full, and uncomfortable. Your breasts may also throb and feel hard, tightly stretched, warm, and tender. After this occurs, you may have milk leaking from your breasts. Your health care provider can help you relieve discomfort due to breast engorgement. Breast engorgement should go away within a few days. °· You may feel more sad or worried than normal due to hormonal changes after delivery. These feelings should not last more than a few days. If these feelings do not go away after several days, speak with your health care provider. °How should I care for myself? °· Tell your health care provider if you have pain or discomfort. °· Drink enough water to keep your urine clear or pale yellow. °· Wash your hands thoroughly with soap and water for at least 20 seconds after changing your sanitary pads, after using the toilet, and before holding or feeding your baby. °· If you are not breastfeeding, avoid touching your breasts a lot. Doing this can make your breasts produce more milk. °· If you become weak or lightheaded, or you feel like you might faint, ask for help before: °? Getting out of bed. °? Showering. °· Change your sanitary pads frequently. Watch for any changes in your flow, such as a sudden increase in volume, a change in color, the passing of large blood clots. If you pass a blood clot from your vagina, save it   to show to your health care provider. Do not flush blood clots down the toilet without having your health care provider look at them.  Make sure that all your vaccinations are up to date. This can help protect you and your baby from getting certain diseases. You may need to have immunizations done before you leave the hospital.  If desired, talk with your health care provider about methods of family planning or birth control (contraception). How can I start bonding with my baby? Spending as much time as  possible with your baby is very important. During this time, you and your baby can get to know each other and develop a bond. Having your baby stay with you in your room (rooming in) can give you time to get to know your baby. Rooming in can also help you become comfortable caring for your baby. Breastfeeding can also help you bond with your baby. How can I plan for returning home with my baby?  Make sure that you have a car seat installed in your vehicle. ? Your car seat should be checked by a certified car seat installer to make sure that it is installed safely. ? Make sure that your baby fits into the car seat safely.  Ask your health care provider any questions you have about caring for yourself or your baby. Make sure that you are able to contact your health care provider with any questions after leaving the hospital. This information is not intended to replace advice given to you by your health care provider. Make sure you discuss any questions you have with your health care provider. Document Released: 05/28/2007 Document Revised: 01/03/2016 Document Reviewed: 07/05/2015 Elsevier Interactive Patient Education  2018 [employer].    Breastfeeding Choosing to breastfeed is one of the best decisions you can make for yourself and your baby. A change in hormones during pregnancy causes your breasts to make breast milk in your milk-producing glands. Hormones prevent breast milk from being released before your baby is born. They also prompt milk flow after birth. Once breastfeeding has begun, thoughts of your baby, as well as his or her sucking or crying, can stimulate the release of milk from your milk-producing glands. Benefits of breastfeeding Research shows that breastfeeding offers many health benefits for infants and mothers. It also offers a cost-free and convenient way to feed your baby. For your baby  Your first milk (colostrum) helps your baby's digestive system to function  better.  Special cells in your milk (antibodies) help your baby to fight off infections.  Breastfed babies are less likely to develop asthma, allergies, obesity, or type 2 diabetes. They are also at lower risk for sudden infant death syndrome (SIDS).  Nutrients in breast milk are better able to meet your babys needs compared to infant formula.  Breast milk improves your baby's brain development. For you  Breastfeeding helps to create a very special bond between you and your baby.  Breastfeeding is convenient. Breast milk costs nothing and is always available at the correct temperature.  Breastfeeding helps to burn calories. It helps you to lose the weight that you gained during pregnancy.  Breastfeeding makes your uterus return faster to its size before pregnancy. It also slows bleeding (lochia) after you give birth.  Breastfeeding helps to lower your risk of developing type 2 diabetes, osteoporosis, rheumatoid arthritis, cardiovascular disease, and breast, ovarian, uterine, and endometrial cancer later in life. Breastfeeding basics Starting breastfeeding  Find a comfortable place to sit or lie  down, with your neck and back well-supported.  Place a pillow or a rolled-up blanket under your baby to bring him or her to the level of your breast (if you are seated). Nursing pillows are specially designed to help support your arms and your baby while you breastfeed.  Make sure that your baby's tummy (abdomen) is facing your abdomen.  Gently massage your breast. With your fingertips, massage from the outer edges of your breast inward toward the nipple. This encourages milk flow. If your milk flows slowly, you may need to continue this action during the feeding.  Support your breast with 4 fingers underneath and your thumb above your nipple (make the letter "C" with your hand). Make sure your fingers are well away from your nipple and your babys mouth.  Stroke your baby's lips gently with  your finger or nipple.  When your baby's mouth is open wide enough, quickly bring your baby to your breast, placing your entire nipple and as much of the areola as possible into your baby's mouth. The areola is the colored area around your nipple. ? More areola should be visible above your baby's upper lip than below the lower lip. ? Your baby's lips should be opened and extended outward (flanged) to ensure an adequate, comfortable latch. ? Your baby's tongue should be between his or her lower gum and your breast.  Make sure that your baby's mouth is correctly positioned around your nipple (latched). Your baby's lips should create a seal on your breast and be turned out (everted).  It is common for your baby to suck about 2-3 minutes in order to start the flow of breast milk. Latching Teaching your baby how to latch onto your breast properly is very important. An improper latch can cause nipple pain, decreased milk supply, and poor weight gain in your baby. Also, if your baby is not latched onto your nipple properly, he or she may swallow some air during feeding. This can make your baby fussy. Burping your baby when you switch breasts during the feeding can help to get rid of the air. However, teaching your baby to latch on properly is still the best way to prevent fussiness from swallowing air while breastfeeding. Signs that your baby has successfully latched onto your nipple  Silent tugging or silent sucking, without causing you pain. Infant's lips should be extended outward (flanged).  Swallowing heard between every 3-4 sucks once your milk has started to flow (after your let-down milk reflex occurs).  Muscle movement above and in front of his or her ears while sucking.  Signs that your baby has not successfully latched onto your nipple  Sucking sounds or smacking sounds from your baby while breastfeeding.  Nipple pain.  If you think your baby has not latched on correctly, slip your  finger into the corner of your babys mouth to break the suction and place it between your baby's gums. Attempt to start breastfeeding again. Signs of successful breastfeeding Signs from your baby  Your baby will gradually decrease the number of sucks or will completely stop sucking.  Your baby will fall asleep.  Your baby's body will relax.  Your baby will retain a small amount of milk in his or her mouth.  Your baby will let go of your breast by himself or herself.  Signs from you  Breasts that have increased in firmness, weight, and size 1-3 hours after feeding.  Breasts that are softer immediately after breastfeeding.  Increased milk volume,  as well as a change in milk consistency and color by the fifth day of breastfeeding.  Nipples that are not sore, cracked, or bleeding.  Signs that your baby is getting enough milk  Wetting at least 1-2 diapers during the first 24 hours after birth.  Wetting at least 5-6 diapers every 24 hours for the first week after birth. The urine should be clear or pale yellow by the age of 5 days.  Wetting 6-8 diapers every 24 hours as your baby continues to grow and develop.  At least 3 stools in a 24-hour period by the age of 5 days. The stool should be soft and yellow.  At least 3 stools in a 24-hour period by the age of 7 days. The stool should be seedy and yellow.  No loss of weight greater than 10% of birth weight during the first 3 days of life.  Average weight gain of 4-7 oz (113-198 g) per week after the age of 4 days.  Consistent daily weight gain by the age of 5 days, without weight loss after the age of 2 weeks. After a feeding, your baby may spit up a small amount of milk. This is normal. Breastfeeding frequency and duration Frequent feeding will help you make more milk and can prevent sore nipples and extremely full breasts (breast engorgement). Breastfeed when you feel the need to reduce the fullness of your breasts or when your  baby shows signs of hunger. This is called "breastfeeding on demand." Signs that your baby is hungry include:  Increased alertness, activity, or restlessness.  Movement of the head from side to side.  Opening of the mouth when the corner of the mouth or cheek is stroked (rooting).  Increased sucking sounds, smacking lips, cooing, sighing, or squeaking.  Hand-to-mouth movements and sucking on fingers or hands.  Fussing or crying.  Avoid introducing a pacifier to your baby in the first 4-6 weeks after your baby is born. After this time, you may choose to use a pacifier. Research has shown that pacifier use during the first year of a baby's life decreases the risk of sudden infant death syndrome (SIDS). Allow your baby to feed on each breast as long as he or she wants. When your baby unlatches or falls asleep while feeding from the first breast, offer the second breast. Because newborns are often sleepy in the first few weeks of life, you may need to awaken your baby to get him or her to feed. Breastfeeding times will vary from baby to baby. However, the following rules can serve as a guide to help you make sure that your baby is properly fed:  Newborns (babies 23 weeks of age or younger) may breastfeed every 1-3 hours.  Newborns should not go without breastfeeding for longer than 3 hours during the day or 5 hours during the night.  You should breastfeed your baby a minimum of 8 times in a 24-hour period.  Breast milk pumping Pumping and storing breast milk allows you to make sure that your baby is exclusively fed your breast milk, even at times when you are unable to breastfeed. This is especially important if you go back to work while you are still breastfeeding, or if you are not able to be present during feedings. Your lactation consultant can help you find a method of pumping that works best for you and give you guidelines about how long it is safe to store breast milk. Caring for your  breasts while you  breastfeed Nipples can become dry, cracked, and sore while breastfeeding. The following recommendations can help keep your breasts moisturized and healthy:  Avoid using soap on your nipples.  Wear a supportive bra designed especially for nursing. Avoid wearing underwire-style bras or extremely tight bras (sports bras).  Air-dry your nipples for 3-4 minutes after each feeding.  Use only cotton bra pads to absorb leaked breast milk. Leaking of breast milk between feedings is normal.  Use lanolin on your nipples after breastfeeding. Lanolin helps to maintain your skin's normal moisture barrier. Pure lanolin is not harmful (not toxic) to your baby. You may also hand express a few drops of breast milk and gently massage that milk into your nipples and allow the milk to air-dry.  In the first few weeks after giving birth, some women experience breast engorgement. Engorgement can make your breasts feel heavy, warm, and tender to the touch. Engorgement peaks within 3-5 days after you give birth. The following recommendations can help to ease engorgement:  Completely empty your breasts while breastfeeding or pumping. You may want to start by applying warm, moist heat (in the shower or with warm, water-soaked hand towels) just before feeding or pumping. This increases circulation and helps the milk flow. If your baby does not completely empty your breasts while breastfeeding, pump any extra milk after he or she is finished.  Apply ice packs to your breasts immediately after breastfeeding or pumping, unless this is too uncomfortable for you. To do this: ? Put ice in a plastic bag. ? Place a towel between your skin and the bag. ? Leave the ice on for 20 minutes, 2-3 times a day.  Make sure that your baby is latched on and positioned properly while breastfeeding.  If engorgement persists after 48 hours of following these recommendations, contact your health care provider or a Environmental consultant. Overall health care recommendations while breastfeeding  Eat 3 healthy meals and 3 snacks every day. Well-nourished mothers who are breastfeeding need an additional 450-500 calories a day. You can meet this requirement by increasing the amount of a balanced diet that you eat.  Drink enough water to keep your urine pale yellow or clear.  Rest often, relax, and continue to take your prenatal vitamins to prevent fatigue, stress, and low vitamin and mineral levels in your body (nutrient deficiencies).  Do not use any products that contain nicotine or tobacco, such as cigarettes and e-cigarettes. Your baby may be harmed by chemicals from cigarettes that pass into breast milk and exposure to secondhand smoke. If you need help quitting, ask your health care provider.  Avoid alcohol.  Do not use illegal drugs or marijuana.  Talk with your health care provider before taking any medicines. These include over-the-counter and prescription medicines as well as vitamins and herbal supplements. Some medicines that may be harmful to your baby can pass through breast milk.  It is possible to become pregnant while breastfeeding. If birth control is desired, ask your health care provider about options that will be safe while breastfeeding your baby. Where to find more information: [employer]: www.llli.org Contact a health care provider if:  You feel like you want to stop breastfeeding or have become frustrated with breastfeeding.  Your nipples are cracked or bleeding.  Your breasts are red, tender, or warm.  You have: ? Painful breasts or nipples. ? A swollen area on either breast. ? A fever or chills. ? Nausea or vomiting. ? Drainage other than breast  milk from your nipples.  Your breasts do not become full before feedings by the fifth day after you give birth.  You feel sad and depressed.  Your baby is: ? Too sleepy to eat well. ? Having trouble  sleeping. ? More than 34 week old and wetting fewer than 6 diapers in a 24-hour period. ? Not gaining weight by 69 days of age.  Your baby has fewer than 3 stools in a 24-hour period.  Your baby's skin or the white parts of his or her eyes become yellow. Get help right away if:  Your baby is overly tired (lethargic) and does not want to wake up and feed.  Your baby develops an unexplained fever. Summary  Breastfeeding offers many health benefits for infant and mothers.  Try to breastfeed your infant when he or she shows early signs of hunger.  Gently tickle or stroke your baby's lips with your finger or nipple to allow the baby to open his or her mouth. Bring the baby to your breast. Make sure that much of the areola is in your baby's mouth. Offer one side and burp the baby before you offer the other side.  Talk with your health care provider or lactation consultant if you have questions or you face problems as you breastfeed. This information is not intended to replace advice given to you by your health care provider. Make sure you discuss any questions you have with your health care provider. Document Released: 07/31/2005 Document Revised: 09/01/2016 Document Reviewed: 09/01/2016 Elsevier Interactive Patient Education  [employer].

## 2018-01-24 NOTE — Discharge Summary (Addendum)
OB Discharge Summary     Patient Name: Joan Andrews DOB: [birth year] MRN: [id number]  Date of admission: 01/22/2018 Delivering MD: Candiss Norse P   Date of discharge: 01/24/2018  Admitting diagnosis: 39 WEEKS CTX Intrauterine pregnancy: 107w2d     Secondary diagnosis:  Active Problems:   Supervision of normal pregnancy   Susceptible to varicella (non-immune), currently pregnant   Indication for care in labor and delivery, antepartum   Normal labor   Spontaneous vaginal delivery  Additional problems: None     Discharge diagnosis: Term Pregnancy Delivered                                                                                                Post partum procedures:None  Augmentation: AROM  Complications: None  Hospital course:  Onset of Labor With Vaginal Delivery     22 y.o. yo [id number] at [redacted]w[redacted]d was admitted in Active Labor on 01/22/2018. Patient had an uncomplicated labor course as follows:  Membrane Rupture Time/Date: 6:24 AM ,01/22/2018   Intrapartum Procedures: Episiotomy: None [1]                                         Lacerations:  2nd degree [3];Perineal [11]  Patient had a delivery of a Viable infant. 01/22/2018  Information for the patient's newborn:  Adelaide, Pfefferkorn Girl Jaylia [[id number]]  Delivery Method: Vaginal, Spontaneous(Filed from Delivery Summary)    Pateint had an uncomplicated postpartum course.  She is ambulating, tolerating a regular diet, passing flatus, and urinating well. Patient is discharged home in stable condition on 01/24/18.   Physical exam  Vitals:   01/23/18 0518 01/23/18 1500 01/23/18 2214 01/24/18 0552  BP: (!) 101/57 97/64 115/69 (!) 100/52  Pulse: 61 87 (!) 103 74  Resp: 20 16 16 16   Temp: 98.4 F (36.9 C) 98.5 F (36.9 C) 98.5 F (36.9 C) 98.2 F (36.8 C)  TempSrc: Oral Axillary Oral Oral  SpO2:  100% 99%   Height:   5\' 5"  (1.651 m)    General: alert, cooperative, no distress Lochia: appropriate Uterine  Fundus: firm Incision: N/A DVT Evaluation: No evidence of DVT seen on physical exam. Labs: Lab Results  Component Value Date   WBC 8.4 01/22/2018   HGB 10.2 (L) 01/22/2018   HCT 30.2 (L) 01/22/2018   MCV 80.3 01/22/2018   PLT 234 01/22/2018   CMP Latest Ref Rng & Units 12/29/2017  Glucose 65 - 99 mg/dL 105(H)  BUN 6 - 20 mg/dL 13  Creatinine 0.44 - 1.00 mg/dL 0.69  Sodium 135 - 145 mmol/L 134(L)  Potassium 3.5 - 5.1 mmol/L 3.8  Chloride 101 - 111 mmol/L 106  CO2 22 - 32 mmol/L 19(L)  Calcium 8.9 - 10.3 mg/dL 8.8(L)  Total Protein 6.5 - 8.1 g/dL 6.9  Total Bilirubin 0.3 - 1.2 mg/dL 0.1(L)  Alkaline Phos 38 - 126 U/L 107  AST 15 - 41 U/L 15  ALT 14 - 54 U/L 15    Discharge instruction:  per After Visit Summary and "Baby and Me Booklet".  After visit meds:  Allergies as of 01/24/2018   No Known Allergies     Medication List    TAKE these medications   butalbital-acetaminophen-caffeine 50-325-40 MG tablet Commonly known as:  FIORICET, ESGIC Take 1 tablet by mouth every 6 (six) hours as needed for headache.   ibuprofen 600 MG tablet Commonly known as:  ADVIL,MOTRIN Take 1 tablet (600 mg total) by mouth every 6 (six) hours.       Diet: routine diet  Activity: Advance as tolerated. Pelvic rest for 6 weeks.   Outpatient follow up:6 weeks Follow up Appt: Future Appointments  Date Time Provider [place]  02/28/2018 10:30 AM Roma Schanz, CNM [facility]   Follow up Visit:No follow-ups on file.  Postpartum contraception: IUD unsure  Newborn Data: Live born female  Birth Weight: 7 lb 4.2 oz (3295 g) APGAR: 8, 9  Newborn Delivery   Birth date/time:  01/22/2018 06:29:00 Delivery type:  Vaginal, Spontaneous     Baby Feeding: Breast Disposition:home with mother   01/24/2018 Charlies Silvers, Medical Student  [facility]  I confirm that I have verified the information documented in the medical student's  note and that I have also personally performed the physical exam and all medical decision making activities.  22 y.o. [id number] s/p SVD, now PPD2. Stable for discharge. PPV in 4 weeks  Gailen Shelter, MD OB Fellow 01/24/2018

## 2018-01-25 ENCOUNTER — Encounter: Payer: Medicaid Other | Admitting: Obstetrics & Gynecology

## 2018-01-30 ENCOUNTER — Encounter: Payer: Self-pay | Admitting: Family Medicine

## 2018-02-05 ENCOUNTER — Encounter: Payer: Self-pay | Admitting: Women's Health

## 2018-02-25 ENCOUNTER — Encounter: Payer: Self-pay | Admitting: Family Medicine

## 2018-02-28 ENCOUNTER — Ambulatory Visit (INDEPENDENT_AMBULATORY_CARE_PROVIDER_SITE_OTHER): Payer: Medicaid Other | Admitting: Women's Health

## 2018-02-28 ENCOUNTER — Encounter: Payer: Self-pay | Admitting: Women's Health

## 2018-02-28 NOTE — Progress Notes (Signed)
   POSTPARTUM VISIT Patient name: Joan Andrews MRN [id number]  Date of birth: 12/20/1995 Chief Complaint:   Postpartum Care  History of Present Illness:   Joan Andrews is a 22 y.o. G37P2012 African American female being seen today for a postpartum visit. She is 5 weeks postpartum following a spontaneous vaginal delivery at 39.2 gestational weeks. Anesthesia: none. I have fully reviewed the prenatal and intrapartum course. Pregnancy uncomplicated. Postpartum course has been uncomplicated. Bleeding no bleeding. Bowel function is normal. Bladder function is normal. Reports itchy rash under Rt breast, has been using A&D Patient is not sexually active. Last sexual activity: prior to birth of baby.  Contraception method is wants IUD.  [place] Postpartum Depression Screening: negative. Score 0.   Last pap 02/08/17.  Results were normal .  No LMP recorded.  Baby's course has been uncomplicated. Baby is feeding by bottle.  Review of Systems:   Pertinent items are noted in HPI Denies Abnormal vaginal discharge w/ itching/odor/irritation, headaches, visual changes, shortness of breath, chest pain, abdominal pain, severe nausea/vomiting, or problems with urination or bowel movements. Pertinent History Reviewed:  Reviewed past medical,surgical, obstetrical and family history.  Reviewed problem list, medications and allergies. OB History  Gravida Para Term Preterm AB Living  3 2 2   1 2   SAB TAB Ectopic Multiple Live Births  1     0 2    # Outcome Date GA Lbr Len/2nd Weight Sex Delivery Anes PTL Lv  3 Term 01/22/18 [redacted]w[redacted]d 03:09 / 00:05 7 lb 4.2 oz (3.295 kg) F Vag-Spont Local  LIV  2 Term 03/01/16 [redacted]w[redacted]d 26:46 / 00:17 6 lb 14.6 oz (3.135 kg) M Vag-Spont Local N LIV     Birth Comments: none  1 SAB 2014           Physical Assessment:   Vitals:   02/28/18 1027  BP: 107/75  Pulse: 74  Weight: 194 lb (88 kg)  Height: 5\' 5"  (1.651 m)  Body mass index is 32.28 kg/m.   Physical Examination:   General appearance: alert, well appearing, and in no distress  Mental status: alert, oriented to person, place, and time  Skin: warm & dry   Cardiovascular: normal heart rate noted   Respiratory: normal respiratory effort, no distress   Breasts: irregular shaped rash under Rt breast/inbetween breast  Abdomen: soft, non-tender   Pelvic: VULVA: normal appearing vulva with no masses, tenderness or lesions, UTERUS: uterus is normal size, shape, consistency and nontender  Rectal: no hemorrhoids  Extremities: no edema       No results found for this or any previous visit (from the past 24 hour(s)).  Assessment & Plan:  1) Postpartum exam 2) 5 wks s/p SVB 3) Bottlefeeding 4) Depression screening 5) Contraception counseling, pt prefers abstinence until IUD insertion  6) Rash Rt breast> use hydrocortisone cream, will recheck w/ IUD insertion  Meds: No orders of the defined types were placed in this encounter.   Follow-up: Return for first available for IUD insertion.   No orders of the defined types were placed in this encounter.   [place], [facility] 02/28/2018 11:04 AM

## 2018-02-28 NOTE — Patient Instructions (Signed)
NO SEX UNTIL AFTER YOU GET YOUR BIRTH CONTROL   Levonorgestrel intrauterine device (IUD) What is this medicine? LEVONORGESTREL IUD (LEE voe nor jes trel) is a contraceptive (birth control) device. The device is placed inside the uterus by a healthcare professional. It is used to prevent pregnancy. This device can also be used to treat heavy bleeding that occurs during your period. This medicine may be used for other purposes; ask your health care provider or pharmacist if you have questions. COMMON BRAND NAME(S): Minette Headland What should I tell my health care provider before I take this medicine? They need to know if you have any of these conditions: -abnormal Pap smear -cancer of the breast, uterus, or cervix -diabetes -endometritis -genital or pelvic infection now or in the past -have more than one sexual partner or your partner has more than one partner -heart disease -history of an ectopic or tubal pregnancy -immune system problems -IUD in place -liver disease or tumor -problems with blood clots or take blood-thinners -seizures -use intravenous drugs -uterus of unusual shape -vaginal bleeding that has not been explained -an unusual or allergic reaction to levonorgestrel, other hormones, silicone, or polyethylene, medicines, foods, dyes, or preservatives -pregnant or trying to get pregnant -breast-feeding How should I use this medicine? This device is placed inside the uterus by a health care professional. Talk to your pediatrician regarding the use of this medicine in children. Special care may be needed. Overdosage: If you think you have taken too much of this medicine contact a poison control center or emergency room at once. NOTE: This medicine is only for you. Do not share this medicine with others. What if I miss a dose? This does not apply. Depending on the brand of device you have inserted, the device will need to be replaced every 3 to 5 years if you  wish to continue using this type of birth control. What may interact with this medicine? Do not take this medicine with any of the following medications: -amprenavir -bosentan -fosamprenavir This medicine may also interact with the following medications: -aprepitant -armodafinil -barbiturate medicines for inducing sleep or treating seizures -bexarotene -boceprevir -griseofulvin -medicines to treat seizures like carbamazepine, ethotoin, felbamate, oxcarbazepine, phenytoin, topiramate -modafinil -pioglitazone -rifabutin -rifampin -rifapentine -some medicines to treat HIV infection like atazanavir, efavirenz, indinavir, lopinavir, nelfinavir, tipranavir, ritonavir -St. John's wort -warfarin This list may not describe all possible interactions. Give your health care provider a list of all the medicines, herbs, non-prescription drugs, or dietary supplements you use. Also tell them if you smoke, drink alcohol, or use illegal drugs. Some items may interact with your medicine. What should I watch for while using this medicine? Visit your doctor or health care professional for regular check ups. See your doctor if you or your partner has sexual contact with others, becomes HIV positive, or gets a sexual transmitted disease. This product does not protect you against HIV infection (AIDS) or other sexually transmitted diseases. You can check the placement of the IUD yourself by reaching up to the top of your vagina with clean fingers to feel the threads. Do not pull on the threads. It is a good habit to check placement after each menstrual period. Call your doctor right away if you feel more of the IUD than just the threads or if you cannot feel the threads at all. The IUD may come out by itself. You may become pregnant if the device comes out. If you notice that the IUD has come out  use a backup birth control method like condoms and call your health care provider. Using tampons will not change the  position of the IUD and are okay to use during your period. This IUD can be safely scanned with magnetic resonance imaging (MRI) only under specific conditions. Before you have an MRI, tell your healthcare provider that you have an IUD in place, and which type of IUD you have in place. What side effects may I notice from receiving this medicine? Side effects that you should report to your doctor or health care professional as soon as possible: -allergic reactions like skin rash, itching or hives, swelling of the face, lips, or tongue -fever, flu-like symptoms -genital sores -high blood pressure -no menstrual period for 6 weeks during use -pain, swelling, warmth in the leg -pelvic pain or tenderness -severe or sudden headache -signs of pregnancy -stomach cramping -sudden shortness of breath -trouble with balance, talking, or walking -unusual vaginal bleeding, discharge -yellowing of the eyes or skin Side effects that usually do not require medical attention (report to your doctor or health care professional if they continue or are bothersome): -acne -breast pain -change in sex drive or performance -changes in weight -cramping, dizziness, or faintness while the device is being inserted -headache -irregular menstrual bleeding within first 3 to 6 months of use -nausea This list may not describe all possible side effects. Call your doctor for medical advice about side effects. You may report side effects to FDA at 1-800-FDA-1088. Where should I keep my medicine? This does not apply. NOTE: This sheet is a summary. It may not cover all possible information. If you have questions about this medicine, talk to your doctor, pharmacist, or health care provider.  2018 Elsevier/Gold Standard (2016-05-12 14:14:56)

## 2018-03-03 ENCOUNTER — Encounter: Payer: Self-pay | Admitting: Family Medicine

## 2018-03-04 NOTE — Telephone Encounter (Signed)
Appointment scheduled.   MyChart message sent to patient.   Attempted to call patient. Noted phone number is temporarily discontinued.

## 2018-03-07 ENCOUNTER — Ambulatory Visit (INDEPENDENT_AMBULATORY_CARE_PROVIDER_SITE_OTHER): Payer: Medicaid Other | Admitting: Advanced Practice Midwife

## 2018-03-07 ENCOUNTER — Encounter: Payer: Self-pay | Admitting: Advanced Practice Midwife

## 2018-03-07 VITALS — BP 116/77 | HR 63 | Wt 192.0 lb

## 2018-03-07 DIAGNOSIS — Z30430 Encounter for insertion of intrauterine contraceptive device: Secondary | ICD-10-CM | POA: Insufficient documentation

## 2018-03-07 DIAGNOSIS — Z3202 Encounter for pregnancy test, result negative: Secondary | ICD-10-CM | POA: Diagnosis not present

## 2018-03-07 LAB — POCT URINE PREGNANCY: PREG TEST UR: NEGATIVE

## 2018-03-07 MED ORDER — LEVONORGESTREL 19.5 MCG/DAY IU IUD
INTRAUTERINE_SYSTEM | Freq: Once | INTRAUTERINE | Status: AC
Start: 2018-03-07 — End: 2018-03-07
  Administered 2018-03-07: 11:00:00 via INTRAUTERINE

## 2018-03-07 NOTE — Progress Notes (Signed)
Isobel K Hachey is a 22 y.o. year old  female   who presents for placement of a Liletta IUD. Her LMP was postpartum, no sex since delivery and her pregnancy test today is negative.    The risks and benefits of the method and placement have been thouroughly reviewed with the patient and all questions were answered.  Specifically the patient is aware of failure rate of 08/998, expulsion of the IUD and of possible perforation.  The patient is aware of irregular bleeding due to the method and understands the incidence of irregular bleeding diminishes with time.  Time out was performed.  A Graves speculum was placed.  The cervix was prepped using Betadine. The uterus was found to be neutral and it sounded to 8 cm.  The cervix was grasped with a tenaculum and the IUD was inserted to 8 cm.  It was pulled back 1 cm and the IUD was disengaged.  The strings were trimmed to 3 cm.  Sonogram was performed and the proper placement of the IUD was verified.  The patient was instructed on signs and symptoms of infection and to check for the strings after each menses or each month.  The patient is to refrain from intercourse for 3 days.  The patient is scheduled for a return appointment after her first menses or 4 weeks.   Joaquim Lai Cresenzo-Dishmon 03/07/2018 11:49 AM

## 2018-04-04 ENCOUNTER — Ambulatory Visit (INDEPENDENT_AMBULATORY_CARE_PROVIDER_SITE_OTHER): Payer: Medicaid Other | Admitting: Advanced Practice Midwife

## 2018-04-04 ENCOUNTER — Other Ambulatory Visit: Payer: Self-pay

## 2018-04-04 ENCOUNTER — Encounter: Payer: Self-pay | Admitting: Advanced Practice Midwife

## 2018-04-04 VITALS — BP 106/69 | HR 73 | Ht 65.0 in | Wt 190.0 lb

## 2018-04-04 DIAGNOSIS — Z30431 Encounter for routine checking of intrauterine contraceptive device: Secondary | ICD-10-CM

## 2018-04-04 NOTE — Progress Notes (Signed)
History:  22 y.o. [id number] here today for today for IUD string check; Liletta IUD was placed  03/07/18. No complaints about the IUD, no concerning side effects. Has spotted a few time, boyfriend doesn't mind strings The following portions of the patient's history were reviewed and updated as appropriate: allergies, current medications, past family history, past medical history, past social history, past surgical history and problem list.  Review of Systems:   Constitutional: Negative for fever and chills Eyes: Negative for visual disturbances Respiratory: Negative for shortness of breath, dyspnea Cardiovascular: Negative for chest pain or palpitations  Gastrointestinal: Negative for vomiting, diarrhea and constipation Genitourinary: Negative for dysuria and urgency Musculoskeletal: Negative for back pain, joint pain, myalgias  Neurological: Negative for dizziness and headaches    Objective:  Physical Exam Blood pressure 106/69, pulse 73, height 5\' 5"  (1.651 m), weight 190 lb (86.2 kg), not currently breastfeeding. Gen: NAD Abd: Soft, nontender and nondistended Pelvic: Bedside US reveals properly place IUD. Has been able to check strings herself.   Assessment & Plan:  Normal IUD check. Patient to keep IUD in place for 5 years; can come in for removal if she desires pregnancy.

## 2018-06-11 ENCOUNTER — Encounter: Payer: Self-pay | Admitting: Family Medicine

## 2018-06-13 NOTE — Telephone Encounter (Signed)
What pt is this please?  childs name?

## 2018-10-03 ENCOUNTER — Ambulatory Visit: Payer: Self-pay | Admitting: Women's Health

## 2018-10-07 ENCOUNTER — Emergency Department (HOSPITAL_COMMUNITY): Payer: Self-pay

## 2018-10-07 ENCOUNTER — Encounter (HOSPITAL_COMMUNITY): Payer: Self-pay

## 2018-10-07 ENCOUNTER — Other Ambulatory Visit: Payer: Self-pay

## 2018-10-07 ENCOUNTER — Encounter: Payer: Self-pay | Admitting: Family Medicine

## 2018-10-07 ENCOUNTER — Emergency Department (HOSPITAL_COMMUNITY)
Admission: EM | Admit: 2018-10-07 | Discharge: 2018-10-07 | Disposition: A | Discharge status: Home / Self Care | Payer: Self-pay | Attending: Emergency Medicine | Admitting: Emergency Medicine

## 2018-10-07 DIAGNOSIS — N390 Urinary tract infection, site not specified: Secondary | ICD-10-CM | POA: Insufficient documentation

## 2018-10-07 DIAGNOSIS — R102 Pelvic and perineal pain: Secondary | ICD-10-CM

## 2018-10-07 DIAGNOSIS — Z3202 Encounter for pregnancy test, result negative: Secondary | ICD-10-CM | POA: Insufficient documentation

## 2018-10-07 DIAGNOSIS — N941 Unspecified dyspareunia: Secondary | ICD-10-CM | POA: Insufficient documentation

## 2018-10-07 LAB — URINALYSIS, ROUTINE W REFLEX MICROSCOPIC
BILIRUBIN URINE: NEGATIVE
GLUCOSE, UA: NEGATIVE mg/dL
Hgb urine dipstick: NEGATIVE
Ketones, ur: NEGATIVE mg/dL
NITRITE: NEGATIVE
PROTEIN: NEGATIVE mg/dL
Specific Gravity, Urine: 1.028 (ref 1.005–1.030)
pH: 5 (ref 5.0–8.0)

## 2018-10-07 LAB — PREGNANCY, URINE: Preg Test, Ur: NEGATIVE

## 2018-10-07 MED ORDER — CEPHALEXIN 500 MG PO CAPS: 500.0000 mg | ORAL_CAPSULE | Freq: Four times a day (QID) | ORAL | 0 refills | Status: DC

## 2018-10-07 NOTE — ED Provider Notes (Signed)
[facility] EMERGENCY DEPARTMENT Provider Note   CSN: [id number] Arrival date & time: 10/07/18  0848    History   Chief Complaint Chief Complaint  Patient presents with  . Pelvic Pain    HPI Joan Andrews is a 23 y.o. female.     Patient is a 23 year old female who presents to the emergency department with 2 weeks of pain during intercourse.  The patient states that she has an IUD in place, and it is been in place for a year.  Patient states that in the past 2 weeks she has been having pain with intercourse.  She has not had any unusual bleeding or vaginal discharge.  She is not had any unusual trauma to the vaginal area.  No recent operations or procedures.  The last menstrual.  Was in the area of September 02, 2018.  Patient denies any recent changes as far as personal bodies wash or douches.  There is been no fever or chills reported.  The history is provided by the patient.    Past Medical History:  Diagnosis Date  . Bronchitis    as a child only  . Contraceptive management 12/26/2013  . Miscarriage   . Pregnant 07/14/2014  . Recurrent tonsillitis   . Seizures ([place])    Questionable  . Supervision of normal pregnancy in first trimester 07/27/2015    [place] Initiated Care at   7+5 weeks FOB  Stephanie Acre 23 yo bm Dating By  LMP and [place] Pap  07/27/15 GC/CT Initial:                36+wks: Genetic Screen NT/IT:  CF screen  Anatomic [place]  Flu vaccine  Tdap Recommended ~ 28wks Glucose Screen  2 hr GBS  Feed Preference  Contraception  Circumcision  Childbirth Classes  Pediatrician      Patient Active Problem List   Diagnosis Date Noted  . Encounter for IUD insertion 03/07/2018  . Seizure-like activity ([place]) 10/03/2017  . Uterine fibroid 07/24/2017  . History of chlamydia 02/16/2016    Past Surgical History:  Procedure Laterality Date  . HERNIA REPAIR       OB History    Gravida  3   Para  2   Term  2   Preterm      AB  1   Living  2     SAB   1   TAB      Ectopic      Multiple  0   Live Births  2            Home Medications    Prior to Admission medications   Not on File    Family History Family History  Problem Relation Age of Onset  . Hypertension Father   . Alzheimer's disease Maternal Grandmother   . Hypertension Mother   . Diabetes Paternal Uncle   . Diabetes Cousin     Social History Social History   Tobacco Use  . Smoking status: Never Smoker  . Smokeless tobacco: Never Used  Substance Use Topics  . Alcohol use: No  . Drug use: No     Allergies   Patient has no known allergies.   Review of Systems Review of Systems  Constitutional: Negative for activity change.       All ROS Neg except as noted in HPI  HENT: Negative for nosebleeds.   Eyes: Negative for photophobia and discharge.  Respiratory: Negative for cough, shortness  of breath and wheezing.   Cardiovascular: Negative for chest pain and palpitations.  Gastrointestinal: Negative for abdominal pain and blood in stool.  Genitourinary: Positive for dyspareunia and vaginal pain. Negative for dysuria, frequency, hematuria, vaginal bleeding and vaginal discharge.  Musculoskeletal: Negative for arthralgias, back pain and neck pain.  Skin: Negative.   Neurological: Negative for dizziness, seizures and speech difficulty.  Psychiatric/Behavioral: Negative for confusion and hallucinations.     Physical Exam Updated Vital Signs BP 102/74 (BP Location: Left Arm)   Pulse 78   Temp 98.8 F (37.1 C) (Oral)   Resp 18   Ht 5\' 6"  (1.676 m)   Wt 90.7 kg   LMP 09/02/2018   SpO2 100%   Breastfeeding No   BMI 32.28 kg/m   Physical Exam Vitals signs and nursing note reviewed.  Constitutional:      Appearance: She is well-developed. She is not toxic-appearing.  HENT:     Head: Normocephalic.     Right Ear: Tympanic membrane and external ear normal.     Left Ear: Tympanic membrane and external ear normal.  Eyes:     General: Lids  are normal.     Pupils: Pupils are equal, round, and reactive to light.  Neck:     Musculoskeletal: Normal range of motion and neck supple.     Vascular: No carotid bruit.  Cardiovascular:     Rate and Rhythm: Normal rate and regular rhythm.     Pulses: Normal pulses.     Heart sounds: Normal heart sounds.  Pulmonary:     Effort: No respiratory distress.     Breath sounds: Normal breath sounds.  Abdominal:     General: Bowel sounds are normal.     Palpations: Abdomen is soft.     Tenderness: There is no abdominal tenderness. There is no guarding.  Musculoskeletal: Normal range of motion.  Lymphadenopathy:     Head:     Right side of head: No submandibular adenopathy.     Left side of head: No submandibular adenopathy.     Cervical: No cervical adenopathy.  Skin:    General: Skin is warm and dry.  Neurological:     Mental Status: She is alert and oriented to person, place, and time.     Cranial Nerves: No cranial nerve deficit.     Sensory: No sensory deficit.  Psychiatric:        Speech: Speech normal.      ED Treatments / Results  Labs (all labs ordered are listed, but only abnormal results are displayed) Labs Reviewed  URINALYSIS, ROUTINE W REFLEX MICROSCOPIC - Abnormal; Notable for the following components:      Result Value   APPearance CLOUDY (*)    Leukocytes,Ua MODERATE (*)    Bacteria, UA RARE (*)    All other components within normal limits  PREGNANCY, URINE    EKG None  Radiology No results found.  Procedures Procedures (including critical care time)  Medications Ordered in ED Medications - No data to display   Initial Impression / Assessment and Plan / ED Course  I have reviewed the triage vital signs and the nursing notes.  Pertinent labs & imaging results that were available during my care of the patient were reviewed by me and considered in my medical decision making (see chart for details).         Final Clinical Impressions(s) / ED  Diagnoses MDM  Vital signs within normal limits.  Pulse oximetry is 100%  on room air.  Within normal limits by my interpretation.  Patient concerned about the location of the IUD, or infection or inflammation that may be caused by the IUD.  Urine pregnancy test is negative.  Urine analysis reveals a cloudy yellow specimen with moderate leukocyte esterase, hyaline casts present, uric acid crystals present, 6-10 red cells, and 11-20 white cells.  A culture of the urine has been sent to the lab.  A pelvic ultrasound was obtained.  It revealed appropriate position of the IUD.  Was read as abnormal pelvic ultrasound.  I have discussed all the findings with the patient in terms which she understands.  Have asked her to see her GYN specialist for additional evaluation and management.  The patient has evidence of a urinary tract infection.  Antibiotics have been started.  Patient is in agreement with this plan.   Final diagnoses:  Pelvic pain  Dyspareunia in female  Urinary tract infection without hematuria, site unspecified    ED Discharge Orders         Ordered    cephALEXin (KEFLEX) 500 MG capsule  4 times daily     10/07/18 1216           Lily Kocher, [place] 10/08/18 1947    Fredia Sorrow, MD 10/09/18 1515

## 2018-10-07 NOTE — ED Triage Notes (Signed)
Pt reports has IUD and for the past 2 weeks has been having pain with intercourse.  Denies any abnormal vaginal bleeding or discharge.  LMP was around the 20th of Jan.

## 2018-10-07 NOTE — Discharge Instructions (Addendum)
Your vital signs are within normal limits.  Your urine test suggest a urinary tract infection.  Please use Keflex with breakfast, lunch, dinner, and at bedtime.  Please increase fluids.  The ultrasound shows your IUD to be in place.  There is no evidence of abscess or other acute problems.  Please discuss this issue with your GYN physician as soon as possible.  Use Tylenol extra strength every 4 hours, or ibuprofen every 6 hours to assist with your discomfort until seen by your GYN specialist.

## 2018-10-10 ENCOUNTER — Encounter: Payer: Self-pay | Admitting: Family Medicine

## 2018-10-11 IMAGING — MR MR HEAD W/O CM
7 of 10 series · 33 of 48 positions shown · non-contrast
Comparison: None.

CLINICAL DATA: 21-year-old pregnant female with new seizure
activity for the past 4 months.

EXAM:
MRI HEAD WITHOUT CONTRAST
TECHNIQUE: Multiplanar, multiecho pulse sequences of the brain and surrounding
structures were obtained without intravenous contrast.

[Series 3: DWI · axial · 3.0mm · 0.76mm/px · z∈[-128,+14]mm · 11 of 98 slices shown (1 of 4)]
[im 1/98]
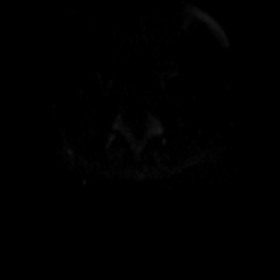
[im 10/98]
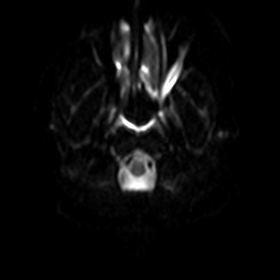
[im 20/98]
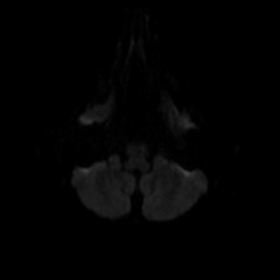
[im 30/98]
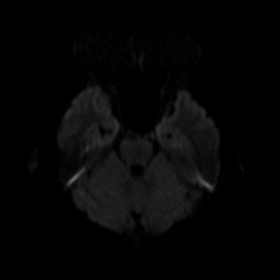
[im 39/98]
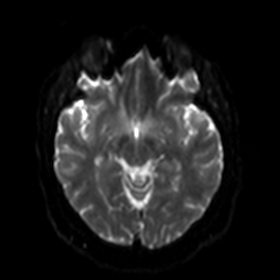
[im 49/98]
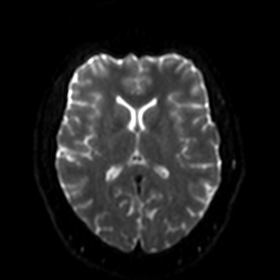
[im 59/98]
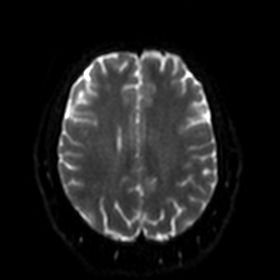
[im 68/98]
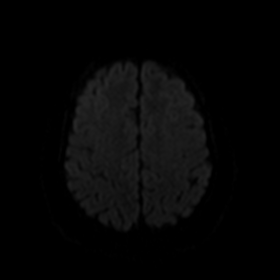
[im 78/98]
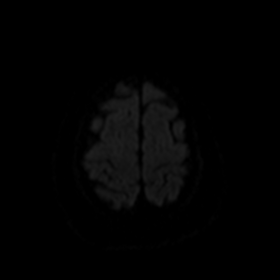
[im 88/98]
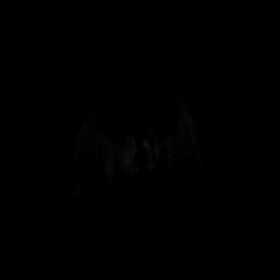
[im 98/98]
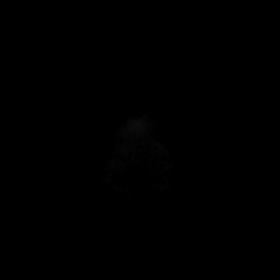

[Series 4: DWI · axial · 3.0mm · 0.74mm/px · z∈[-125,+18]mm · 6 of 50 slices shown (2 of 4)]
[im 1/50]
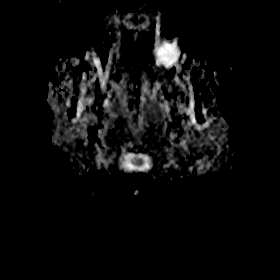
[im 10/50]
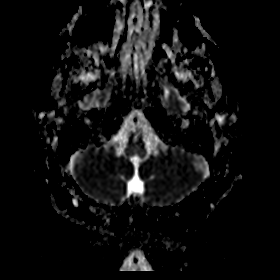
[im 20/50]
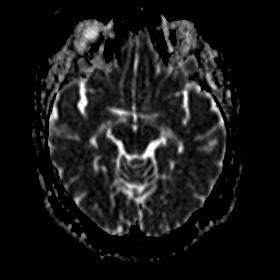
[im 30/50]
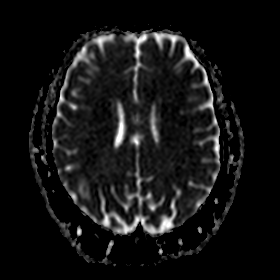
[im 40/50]
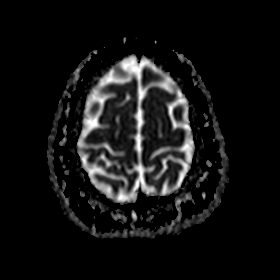
[im 50/50]
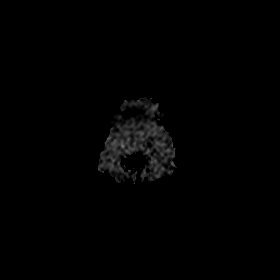

[Series 5: DWI · coronal · 5.0mm · 0.48mm/px · 4 of 32 slices shown (3 of 4)]
[im 1/32]
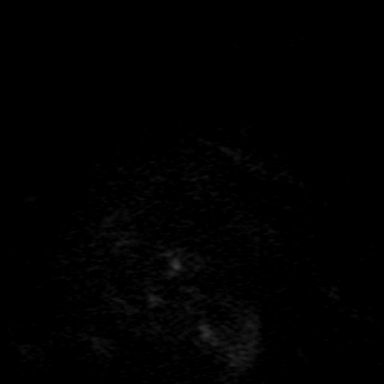
[im 11/32]
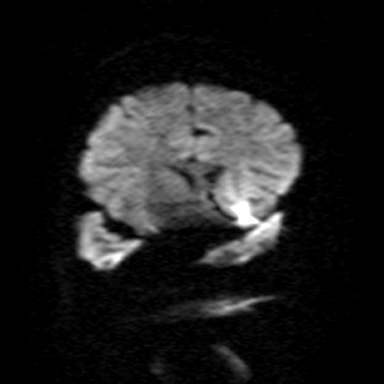
[im 21/32]
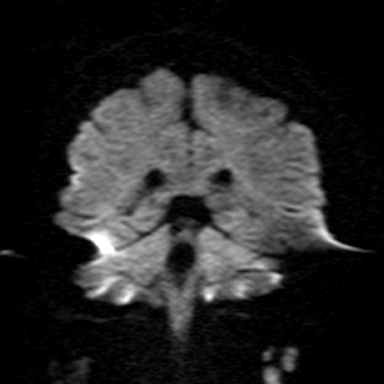
[im 32/32]
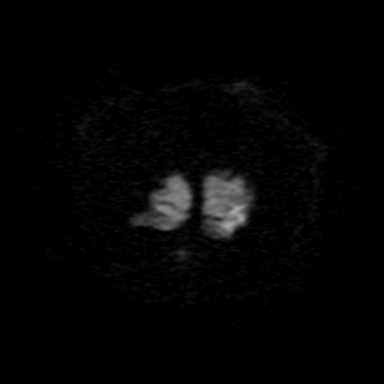

[Series 6: DWI · coronal · 5.0mm · 0.47mm/px · 4 of 32 slices shown (4 of 4)]
[im 1/32]
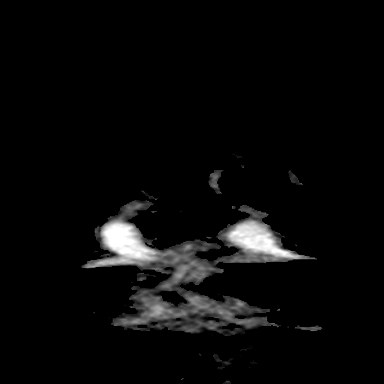
[im 11/32]
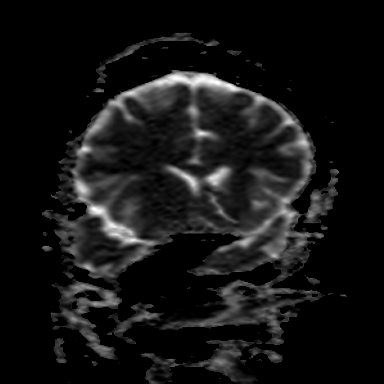
[im 21/32]
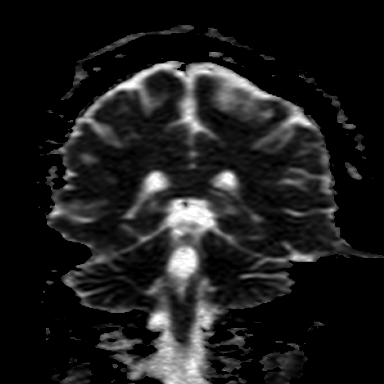
[im 32/32]
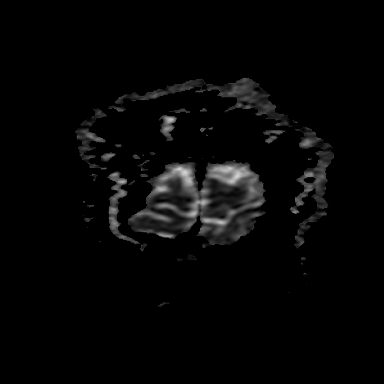

[Series 7: T2 · axial · 5.0mm · 0.47mm/px · z∈[-126,-8]mm · 2 of 20 slices shown (1 of 2)]
[im 1/20]
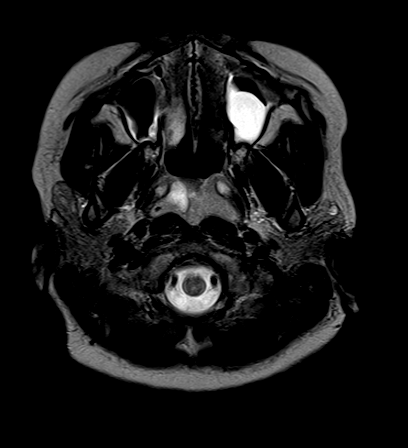
[im 20/20]
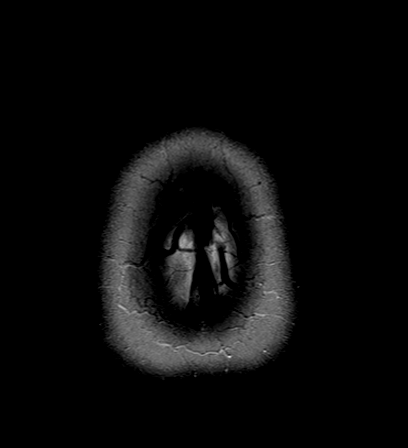

[Series 8: FLAIR · axial · 3.0mm · 0.33mm/px · z∈[-133,-1]mm · 5 of 47 slices shown]
[im 1/47]
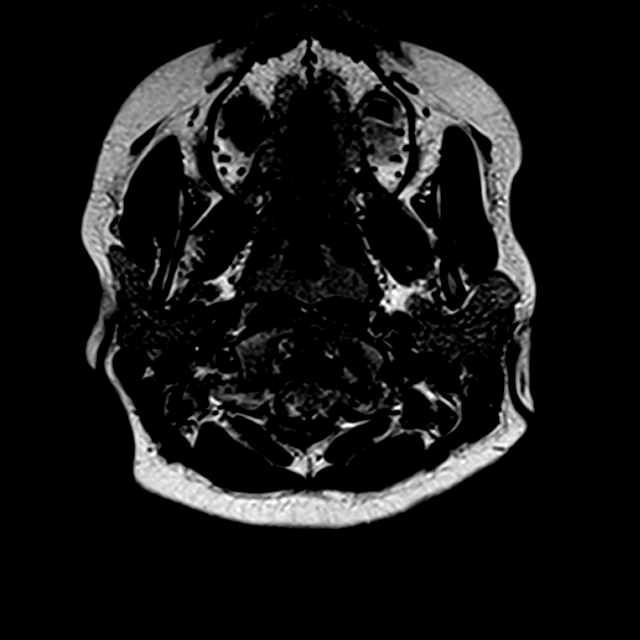
[im 12/47]
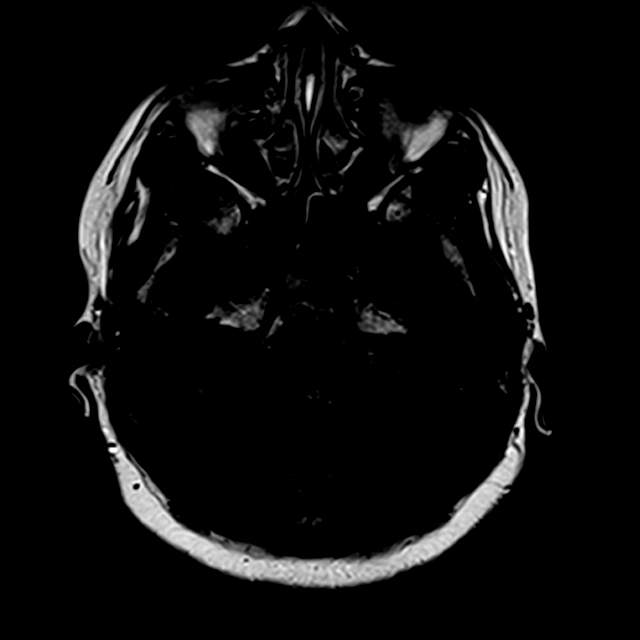
[im 24/47]
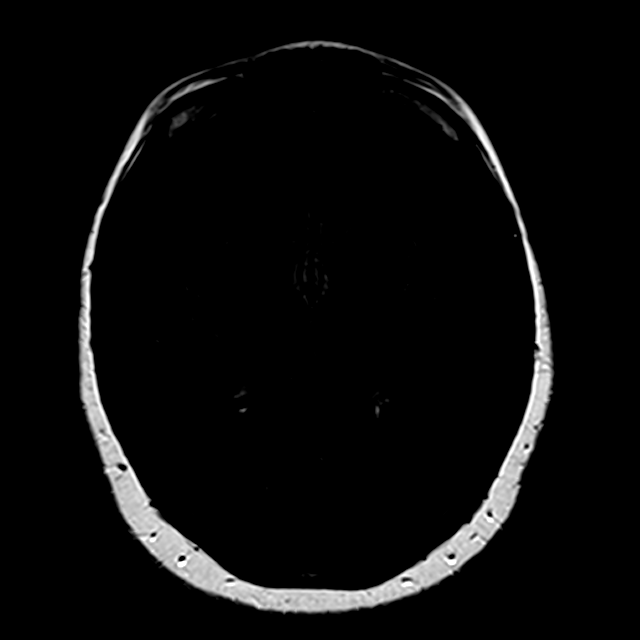
[im 35/47]
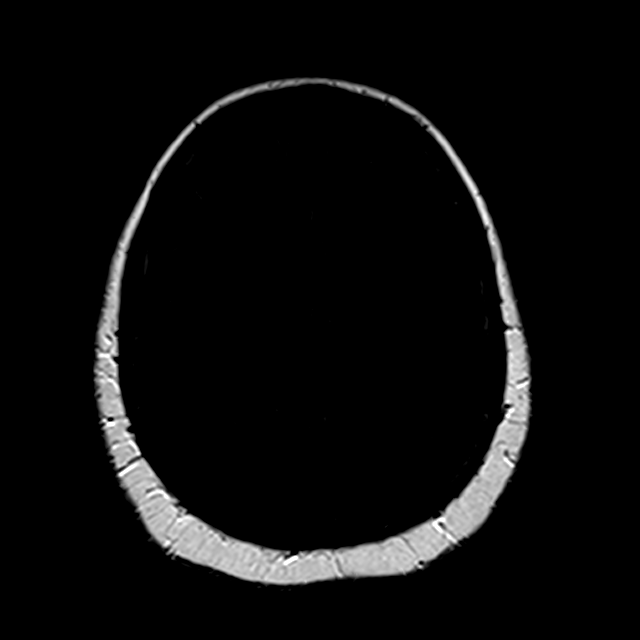
[im 47/47]
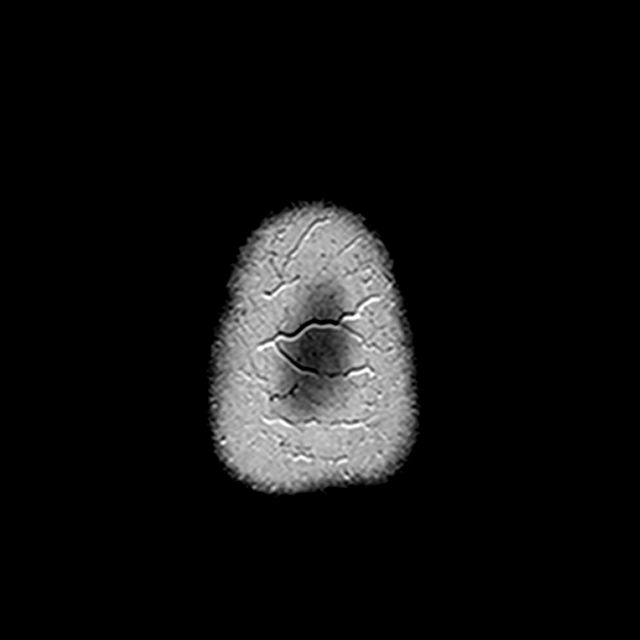

[Series 11: T2 · coronal · 5.0mm · 0.43mm/px · 1 of 24 slices shown (2 of 2)]
[im 1/24]
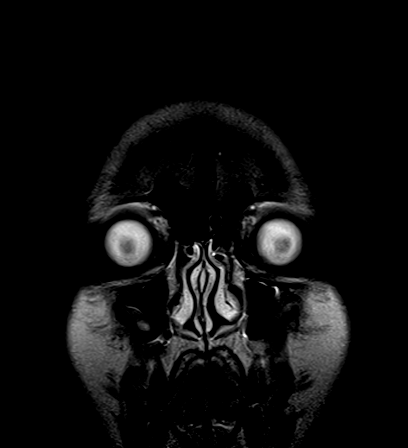

[33 of 48 positions shown; findings below may reference images not displayed]

FINDINGS: Brain: Normal cerebral volume.

No restricted diffusion to suggest acute infarction. No midline
shift, mass effect, evidence of mass lesion, ventriculomegaly,
extra-axial collection or acute intracranial hemorrhage.
Cervicomedullary junction and pituitary are within normal limits.

Gray and white matter signal is within normal limits throughout the
brain. No encephalomalacia or chronic cerebral blood products.

Vascular: Major intracranial vascular flow voids appear normal.

Skull and upper cervical spine: Negative visible cervical spine.
Visualized bone marrow signal is within normal limits.

Sinuses/Orbits: Normal orbits soft tissues.

Mild to moderate bilateral paranasal sinus mucosal thickening, most
pronounced in the left maxillary sinus where there is probably a
superimposed 2 centimeter mucous retention cyst (series 11, image
21).

Other: Benign appearing retention cysts in the nasopharynx greater
on the right (series 11, image 15). Visible internal auditory
structures appear normal. Mastoid air cells are clear. Scalp and
face soft tissues appear negative.
IMPRESSION: 1. Normal noncontrast MRI appearance of the brain.
2. Mild to moderate bilateral paranasal sinus inflammation.

## 2019-03-17 ENCOUNTER — Encounter: Payer: Self-pay | Admitting: Family Medicine

## 2019-03-24 ENCOUNTER — Encounter: Payer: Self-pay | Admitting: Family Medicine

## 2019-08-27 ENCOUNTER — Ambulatory Visit: Payer: 59 | Admitting: Family Medicine

## 2019-08-27 ENCOUNTER — Other Ambulatory Visit: Payer: Self-pay

## 2019-08-27 ENCOUNTER — Encounter: Payer: Self-pay | Admitting: Family Medicine

## 2019-08-27 VITALS — BP 98/62 | HR 87 | Temp 98.8°F | Resp 18 | Ht 66.0 in | Wt 204.8 lb

## 2019-08-27 DIAGNOSIS — M546 Pain in thoracic spine: Secondary | ICD-10-CM

## 2019-08-27 DIAGNOSIS — N62 Hypertrophy of breast: Secondary | ICD-10-CM

## 2019-08-27 DIAGNOSIS — M542 Cervicalgia: Secondary | ICD-10-CM

## 2019-08-27 DIAGNOSIS — E669 Obesity, unspecified: Secondary | ICD-10-CM

## 2019-08-27 DIAGNOSIS — G8929 Other chronic pain: Secondary | ICD-10-CM

## 2019-08-27 DIAGNOSIS — M25511 Pain in right shoulder: Secondary | ICD-10-CM

## 2019-08-27 DIAGNOSIS — M25512 Pain in left shoulder: Secondary | ICD-10-CM

## 2019-08-27 MED ORDER — NAPROXEN 500 MG PO TABS: 500.0000 mg | ORAL_TABLET | Freq: Two times a day (BID) | ORAL | 1 refills | Status: DC

## 2019-08-27 MED ORDER — METHOCARBAMOL 500 MG PO TABS: 500.0000 mg | ORAL_TABLET | Freq: Three times a day (TID) | ORAL | 1 refills | Status: DC | PRN

## 2019-08-27 NOTE — Progress Notes (Signed)
Subjective:    Patient ID: Joan Andrews, female    DOB: 1996/01/15, 24 y.o.   MRN: [id number]  Patient presents for Back Pain (everyday back pain, tylenol was taken)   Pthere with upper back and shoulder pain for 2 years now ( worsened after having her daughter), Aching pain all the time , has large chest  38DDD,.  She always feels fatigued with the heaviness of her chest especially after working.  She gets spasm in her neck and pain, states tender if you press on her spine She tries to exercise but this causes discomfort in the shoulders and the upper back as well.   No tingling or numbness in hands  Takes tylenol or ASA, that doest help Occ low back pain but mostly upper back    LMP- Dec 20th   No problems urinating or with bowels    She is interested in weight loss medication  24 hours-    Yesterday only had baked chicken and rice, typically snacks a lot   she does eat a lot of fast food, states she was on a [employer] kick for a while   She drinks a lot soda typically 3 cans a day She drinks some water  Does not eat many veggies  Review Of Systems:  GEN- denies fatigue, fever, weight loss,weakness, recent illness HEENT- denies eye drainage, change in vision, nasal discharge, CVS- denies chest pain, palpitations RESP- denies SOB, cough, wheeze ABD- denies N/V, change in stools, abd pain GU- denies dysuria, hematuria, dribbling, incontinence MSK- + joint pain, muscle aches, injury Neuro- denies headache, dizziness, syncope, seizure activity       Objective:    BP 98/62 (BP Location: Right Arm, Patient Position: Sitting, Cuff Size: Normal)   Pulse 87   Temp 98.8 F (37.1 C) (Oral)   Resp 18   Ht 5\' 6"  (1.676 m)   Wt 204 lb 12.8 oz (92.9 kg)   SpO2 99%   BMI 33.06 kg/m  GEN- NAD, alert and oriented x3 HEENT- PERRL, EOMI, non injected sclera, pink conjunctiva, MMM, oropharynx clear Neck- Supple, no thyromegaly, FROM neck, TTP C spine CVS- RRR, no  murmur Chest wall- large bust size, bra  indentations in shoulder region  RESP-CTAB MSK- TTP thoracic spine, and paraspinals, TTP across top of shoulder and shoulder blades, FROM upper and lower ext Neuro- normal tone LE, strength in tact bilat upper and lower ext  EXT- No edema Pulses- Radial, DP- 2+        Assessment & Plan:      Problem List Items Addressed This Visit      Unprioritized   Obesity (BMI 30-39.9)    Will return for a complete physical with fasting labs.  We discussed some dietary changes that she can start with today.  She is going to decrease her soda to 2 cans a day.  Replace the other can with water throughout the day.  She is also going to try to have veggies with both lunch and dinner.  I am considering phentermine to aid with weight loss versus Saxenda.  We will see what her labs look like first.       Other Visit Diagnoses    Chronic thoracic back pain, unspecified back pain laterality    -  Primary   obtain xrays, suspect this is MSK pain from the weight of her chest. also given robaxin, naprosyn for inflammation and spasm   Relevant Medications   methocarbamol (ROBAXIN)  500 MG tablet   naproxen (NAPROSYN) 500 MG tablet   Other Relevant Orders   DG Thoracic Spine W/Swimmers   Neck pain       Relevant Orders   DG Cervical Spine Complete   Chronic pain of both shoulders       Relevant Medications   methocarbamol (ROBAXIN) 500 MG tablet   naproxen (NAPROSYN) 500 MG tablet   Other Relevant Orders   DG Thoracic Spine W/Swimmers   Ambulatory referral to Plastic Surgery   Large breasts       Referral to plastic surgery for consultation about reduction    Relevant Orders   Ambulatory referral to Plastic Surgery      Note: This dictation was prepared with Dragon dictation along with smaller phrase technology. Any transcriptional errors that result from this process are unintentional.

## 2019-08-27 NOTE — Patient Instructions (Addendum)
Decrease soda to 2 cans a day  Increase water  Veggies lunch and dinner  Get the xrays of your back Get the muscle relaxer and anti-inflammatory F/U in 3-4 weeks Physical/fasting labs

## 2019-08-27 NOTE — Assessment & Plan Note (Signed)
Will return for a complete physical with fasting labs.  We discussed some dietary changes that she can start with today.  She is going to decrease her soda to 2 cans a day.  Replace the other can with water throughout the day.  She is also going to try to have veggies with both lunch and dinner.  I am considering phentermine to aid with weight loss versus Saxenda.  We will see what her labs look like first.

## 2019-08-29 ENCOUNTER — Other Ambulatory Visit: Payer: Self-pay

## 2019-08-29 ENCOUNTER — Ambulatory Visit (HOSPITAL_COMMUNITY)
Admission: RE | Admit: 2019-08-29 | Discharge: 2019-08-29 | Disposition: A | Discharge status: Home / Self Care | Payer: 59 | Source: Ambulatory Visit | Attending: Family Medicine | Admitting: Family Medicine

## 2019-08-29 ENCOUNTER — Encounter (HOSPITAL_COMMUNITY): Payer: Self-pay

## 2019-08-29 DIAGNOSIS — M546 Pain in thoracic spine: Secondary | ICD-10-CM

## 2019-08-29 DIAGNOSIS — G8929 Other chronic pain: Secondary | ICD-10-CM | POA: Diagnosis present

## 2019-08-29 DIAGNOSIS — M542 Cervicalgia: Secondary | ICD-10-CM | POA: Diagnosis not present

## 2019-08-29 DIAGNOSIS — M25512 Pain in left shoulder: Secondary | ICD-10-CM | POA: Insufficient documentation

## 2019-08-29 DIAGNOSIS — M25511 Pain in right shoulder: Secondary | ICD-10-CM | POA: Insufficient documentation

## 2019-09-02 ENCOUNTER — Telehealth: Payer: Self-pay | Admitting: *Deleted

## 2019-09-02 NOTE — Telephone Encounter (Signed)
Received call from patient.   States that she contacted her insurance since it has changed and was advised of in [employer], building services. Requested return call to discuss.   [phone]~ telephone.

## 2019-09-03 NOTE — Telephone Encounter (Signed)
Spoke with patient and she gave me facilities that specialize in breast surgeries but for breast cancer patient's advised patient that we have a breast surgeon with [facility] patient requested to see this provider.

## 2019-09-15 ENCOUNTER — Encounter: Payer: Self-pay | Admitting: Family Medicine

## 2019-09-17 ENCOUNTER — Other Ambulatory Visit: Payer: Self-pay | Admitting: Family Medicine

## 2019-09-17 ENCOUNTER — Ambulatory Visit: Payer: 59

## 2019-09-17 ENCOUNTER — Other Ambulatory Visit: Payer: Self-pay

## 2019-09-17 DIAGNOSIS — Z0000 Encounter for general adult medical examination without abnormal findings: Secondary | ICD-10-CM

## 2019-09-17 LAB — CBC WITH DIFFERENTIAL/PLATELET
Absolute Monocytes: 479 cells/uL (ref 200–950)
Basophils Absolute: 63 cells/uL (ref 0–200)
Basophils Relative: 1.1 %
Eosinophils Absolute: 40 cells/uL (ref 15–500)
Eosinophils Relative: 0.7 %
HCT: 37 % (ref 35.0–45.0)
Hemoglobin: 12 g/dL (ref 11.7–15.5)
Lymphs Abs: 2177 cells/uL (ref 850–3900)
MCH: 26.5 pg — ABNORMAL LOW (ref 27.0–33.0)
MCHC: 32.4 g/dL (ref 32.0–36.0)
MCV: 81.7 fL (ref 80.0–100.0)
MPV: 9.7 fL (ref 7.5–12.5)
Monocytes Relative: 8.4 %
Neutro Abs: 2941 cells/uL (ref 1500–7800)
Neutrophils Relative %: 51.6 %
Platelets: 268 10*3/uL (ref 140–400)
RBC: 4.53 10*6/uL (ref 3.80–5.10)
RDW: 12.9 % (ref 11.0–15.0)
Total Lymphocyte: 38.2 %
WBC: 5.7 10*3/uL (ref 3.8–10.8)

## 2019-09-17 LAB — LIPID PANEL
Cholesterol: 114 mg/dL (ref <200)
HDL: 37 mg/dL — ABNORMAL LOW (ref >=50)
LDL Cholesterol (Calc): 66 mg/dL (calc)
Non-HDL Cholesterol (Calc): 77 mg/dL (calc) (ref <130)
Total CHOL/HDL Ratio: 3.1 (calc) (ref <5.0)
Triglycerides: 39 mg/dL (ref <150)

## 2019-09-17 LAB — COMPREHENSIVE METABOLIC PANEL
AG Ratio: 1.2 (calc) (ref 1.0–2.5)
ALT: 14 U/L (ref 6–29)
AST: 14 U/L (ref 10–30)
Albumin: 3.8 g/dL (ref 3.6–5.1)
Alkaline phosphatase (APISO): 61 U/L (ref 31–125)
BUN: 16 mg/dL (ref 7–25)
CO2: 27 mmol/L (ref 20–32)
Calcium: 9 mg/dL (ref 8.6–10.2)
Chloride: 104 mmol/L (ref 98–110)
Creat: 0.87 mg/dL (ref 0.50–1.10)
Globulin: 3.2 g/dL (calc) (ref 1.9–3.7)
Glucose, Bld: 93 mg/dL (ref 65–99)
Potassium: 4.4 mmol/L (ref 3.5–5.3)
Sodium: 137 mmol/L (ref 135–146)
Total Bilirubin: 0.5 mg/dL (ref 0.2–1.2)
Total Protein: 7 g/dL (ref 6.1–8.1)

## 2019-09-19 ENCOUNTER — Other Ambulatory Visit: Payer: Self-pay

## 2019-09-19 ENCOUNTER — Ambulatory Visit (INDEPENDENT_AMBULATORY_CARE_PROVIDER_SITE_OTHER): Payer: 59 | Admitting: Family Medicine

## 2019-09-19 ENCOUNTER — Encounter: Payer: Self-pay | Admitting: Family Medicine

## 2019-09-19 VITALS — BP 120/70 | HR 90 | Temp 98.1°F | Resp 17 | Ht 66.0 in | Wt 203.4 lb

## 2019-09-19 DIAGNOSIS — Z0000 Encounter for general adult medical examination without abnormal findings: Secondary | ICD-10-CM | POA: Diagnosis not present

## 2019-09-19 DIAGNOSIS — E669 Obesity, unspecified: Secondary | ICD-10-CM | POA: Diagnosis not present

## 2019-09-19 NOTE — Patient Instructions (Addendum)
Call Dr. Audelia Hives - Plastic surgery  [phone] F/U 1 year for Physical

## 2019-09-19 NOTE — Assessment & Plan Note (Signed)
Congratulated on cutting out the soda in her diet.  She will replace this with more water.  We did discuss her next steps which will be cutting down on the fast food.  She has been trying to cook more at home therefore incorporating more veggies into her diet.

## 2019-09-19 NOTE — Progress Notes (Signed)
   Subjective:    Patient ID: Joan Andrews, female    DOB: 1995/12/20, 24 y.o.   MRN: [id number]  Patient presents for Annual Exam Patient here for annual physical exam.  Her fasting labs are reviewed at bedside.  Labs fairly normal besides low HDL at 37 Pap smear last done in June 2018.  No history of abnormal Pap smear Immunization due for tetanus and flu shot Declnes  Contraception IUD in place  She has appt on  2/17 with plastic surgery   She does not follow wit dentist or Eye doctor, not sure her insurance covers this  She is working part time and watching her 2 youngc hildren , her significant other is actually joining [employer] within the next couple weeks.  She has been trying to work on some dietary changes.  She has cut out soda in the past couple weeks.  She is trying to increase her fruits and veggies and decrease how much fast food she has been eating.  Review Of Systems:  GEN- denies fatigue, fever, weight loss,weakness, recent illness HEENT- denies eye drainage, change in vision, nasal discharge, CVS- denies chest pain, palpitations RESP- denies SOB, cough, wheeze ABD- denies N/V, change in stools, abd pain GU- denies dysuria, hematuria, dribbling, incontinence MSK- denies joint pain, muscle aches, injury Neuro- denies headache, dizziness, syncope, seizure activity       Objective:    BP 120/70   Pulse 90   Temp 98.1 F (36.7 C) (Temporal)   Resp 17   Ht 5\' 6"  (1.676 m)   Wt 203 lb 6 oz (92.3 kg)   SpO2 99%   BMI 32.83 kg/m  GEN- NAD, alert and oriented x3 HEENT- PERRL, EOMI, non injected sclera, pink conjunctiva, MMM, oropharynx clear Neck- Supple, no thyromegaly CVS- RRR, no murmur RESP-CTAB ABD-NABS,soft,NT,ND EXT- No edema Pulses- Radial, DP- 2+   CAGE/[place] screen neg     Assessment & Plan:      Problem List Items Addressed This Visit      Unprioritized   Obesity (BMI 30-39.9)    Congratulated on cutting out the soda  in her diet.  She will replace this with more water.  We did discuss her next steps which will be cutting down on the fast food.  She has been trying to cook more at home therefore incorporating more veggies into her diet.       Other Visit Diagnoses    Routine general medical examination at a health care facility    -  Primary   CPE done.  Patient has GYN Pap smear up-to-date.  She declines immunizations.  Reviewed fasting labs at bedside      Note: This dictation was prepared with Dragon dictation along with smaller phrase technology. Any transcriptional errors that result from this process are unintentional.

## 2019-10-01 ENCOUNTER — Ambulatory Visit: Payer: 59 | Admitting: Plastic Surgery

## 2019-10-01 ENCOUNTER — Encounter: Payer: Self-pay | Admitting: Plastic Surgery

## 2019-10-01 ENCOUNTER — Other Ambulatory Visit: Payer: Self-pay

## 2019-10-01 VITALS — BP 95/67 | HR 77 | Temp 99.0°F | Ht 66.0 in | Wt 206.6 lb

## 2019-10-01 DIAGNOSIS — N62 Hypertrophy of breast: Secondary | ICD-10-CM

## 2019-10-01 NOTE — Progress Notes (Signed)
Referring Provider Alycia Rossetti, MD [address] [place],  [place] [address]   CC:  Chief Complaint  Patient presents with  . Advice Only    Patient here for consultation for BL breast reduction      Joan Andrews is an 24 y.o. female.  HPI: Patient presents to discuss breast reduction.  She has had back pain neck pain and shoulder grooving that has not responded to muscle relaxers and anti-inflammatories.  She has had rashes beneath her breast.  She is no family history of breast cancer or other breast procedures.  She is a 38 triple D and wants to be around a B cup.  She has had 2 kids and expects to have another.  No Known Allergies  Outpatient Encounter Medications as of 10/01/2019  Medication Sig  . methocarbamol (ROBAXIN) 500 MG tablet Take 1 tablet (500 mg total) by mouth every 8 (eight) hours as needed for muscle spasms.  . naproxen (NAPROSYN) 500 MG tablet Take 1 tablet (500 mg total) by mouth 2 (two) times daily with a meal.   No facility-administered encounter medications on file as of 10/01/2019.     Past Medical History:  Diagnosis Date  . Bronchitis    as a child only  . Contraceptive management 12/26/2013  . Miscarriage   . Pregnant 07/14/2014  . Recurrent tonsillitis   . Seizures ([place])    Questionable  . Supervision of normal pregnancy in first trimester 07/27/2015    [place] Initiated Care at   7+5 weeks FOB  Stephanie Acre 23 yo bm Dating By  LMP and [place] Pap  07/27/15 GC/CT Initial:                36+wks: Genetic Screen NT/IT:  CF screen  Anatomic [place]  Flu vaccine  Tdap Recommended ~ 28wks Glucose Screen  2 hr GBS  Feed Preference  Contraception  Circumcision  Childbirth Classes  Pediatrician      Past Surgical History:  Procedure Laterality Date  . HERNIA REPAIR      Family History  Problem Relation Age of Onset  . Hypertension Father   . Alzheimer's disease Maternal Grandmother   . Hypertension Mother   . Diabetes Paternal  Uncle   . Diabetes Cousin     Social History   Social History Narrative  . Not on file     Review of Systems General: Denies fevers, chills, weight loss CV: Denies chest pain, shortness of breath, palpitations  Physical Exam Vitals with BMI 10/01/2019 09/19/2019 09/19/2019  Height 5\' 6"  - 5\' 6"   Weight 206 lbs 10 oz - 203 lbs 6 oz  BMI XX123456 - XX123456  Systolic 95 - 123456  Diastolic 67 - 70  Pulse 77 90 110    General:  No acute distress,  Alert and oriented, Non-Toxic, Normal speech and affect Breast: She has grade 3 ptosis.  Sternal notch to nipple is 40 cm bilaterally.  Nipple to fold is 22 cm bilaterally.  I do not see any masses or scars.  Assessment/Plan The patient has bilateral symptomatic macromastia.  She is a good candidate for a breast reduction.  The details of breast reduction surgery were discussed.  I explained the procedure in detail along the with the expected scars.  The risks were discussed in detail and include bleeding, infection, damage to surrounding structures, need for additional procedures, nipple loss, change in nipple sensation, persistent pain, contour irregularities and asymmetries.  I explained that breast feeding is often not possible after breast reduction surgery.  We discussed the expected postoperative course with an overall recovery period of about 1 month.  She demonstrated full understanding of all risks.  We discussed her personal risk factors that include prolonged length of her breasts which make pedicle breast reduction a little bit more risky.  We discussed pedicle breast reduction and breast reduction with free nipple graft.  I explained the pros and cons of each.  I explained given the long pedicles for her there is a slight increased risk of nipple necrosis for a pedicled breast reduction.  I explained the downsides of a free nipple graft would be a change in nipple color and absence of nipple sensation.  The downsides of the free nipple graft are  very undesirable for her so she elects to proceed with a pedicle breast reduction.  I anticipate approximately 900g of tissue removed from each side.   [name] 10/01/2019, 4:28 PM

## 2019-10-08 ENCOUNTER — Telehealth: Payer: Self-pay | Admitting: Plastic Surgery

## 2019-10-08 NOTE — Telephone Encounter (Signed)
RIV from [facility] called to advise [place] that cpt 19318 has been approved from 10/13/19-01/13/20. Josem Kaufmann [id number]. Further questions, please call [phone] and fax [phone]

## 2019-10-15 ENCOUNTER — Ambulatory Visit (INDEPENDENT_AMBULATORY_CARE_PROVIDER_SITE_OTHER): Payer: 59 | Admitting: Surgical

## 2019-10-15 ENCOUNTER — Other Ambulatory Visit: Payer: Self-pay

## 2019-10-15 ENCOUNTER — Encounter: Payer: Self-pay | Admitting: Surgical

## 2019-10-15 VITALS — BP 99/70 | HR 80 | Temp 98.4°F | Ht 66.0 in | Wt 207.4 lb

## 2019-10-15 DIAGNOSIS — N62 Hypertrophy of breast: Secondary | ICD-10-CM

## 2019-10-15 MED ORDER — HYDROCODONE-ACETAMINOPHEN 5-325 MG PO TABS: 1.0000 | ORAL_TABLET | Freq: Four times a day (QID) | ORAL | 0 refills | Status: AC | PRN

## 2019-10-15 MED ORDER — ONDANSETRON HCL 4 MG PO TABS: 4.0000 mg | ORAL_TABLET | Freq: Three times a day (TID) | ORAL | 0 refills | Status: DC | PRN

## 2019-10-15 NOTE — H&P (View-Only) (Signed)
Patient ID: Joan Andrews, female    DOB: [birth year], 24 y.o.   MRN: [id number]  Chief Complaint  Patient presents with  . Pre-op Exam    Patient here for H&P for BL breast reduction      ICD-10-CM   1. Macromastia  N62    History of Present Illness: Joan Andrews is a 24 y.o.  female  with a history of macromastia which causes her back pain neck pain and shoulder grooving.  She has tried muscle relaxers and anti-inflammatories.  She presents for preoperative evaluation for upcoming procedure, bilateral breast reduction, scheduled for 11/04/2019 with Dr. Silverio Lay pace.  She is currently 40 triple D and reports she would like to be around a B cup.  The patient has not had problems with anesthesia.  She reports her only other surgery was a hernia repair when she was about 24 years old.  She does recall any family history of issues with anesthesia.  She is generally healthy. No history or family history of DVT/PE.  She does not take any medications.  She does have a IUD in place, progestin releasing. She is not on any blood thinners.  No history or family history of bleeding disorders No history of any cardiac or pulmonary diseases.  She reports she has elected to undergo pedicle breast reduction instead of free nipple graft.  Past Medical History: Allergies: No Known Allergies  Current Medications:  Current Outpatient Medications:  .  methocarbamol (ROBAXIN) 500 MG tablet, Take 1 tablet (500 mg total) by mouth every 8 (eight) hours as needed for muscle spasms., Disp: 45 tablet, Rfl: 1 .  naproxen (NAPROSYN) 500 MG tablet, Take 1 tablet (500 mg total) by mouth 2 (two) times daily with a meal., Disp: 60 tablet, Rfl: 1  Past Medical Problems: Past Medical History:  Diagnosis Date  . Bronchitis    as a child only  . Contraceptive management 12/26/2013  . Miscarriage   . Pregnant 07/14/2014  . Recurrent tonsillitis   . Seizures ([place])    Questionable  .  Supervision of normal pregnancy in first trimester 07/27/2015    [place] Initiated Care at   7+5 weeks FOB  Stephanie Acre 23 yo bm Dating By  LMP and [place] Pap  07/27/15 GC/CT Initial:                36+wks: Genetic Screen NT/IT:  CF screen  Anatomic [place]  Flu vaccine  Tdap Recommended ~ 28wks Glucose Screen  2 hr GBS  Feed Preference  Contraception  Circumcision  Childbirth Classes  Pediatrician      Past Surgical History: Past Surgical History:  Procedure Laterality Date  . HERNIA REPAIR      Social History: Social History   Socioeconomic History  . Marital status: Single    Spouse name: Not on file  . Number of children: Not on file  . Years of education: Not on file  . Highest education level: Not on file  Occupational History  . Not on file  Tobacco Use  . Smoking status: Never Smoker  . Smokeless tobacco: Never Used  Substance and Sexual Activity  . Alcohol use: No  . Drug use: No  . Sexual activity: Yes    Partners: Male    Birth control/protection: I.U.D.  Other Topics Concern  . Not on file  Social History Narrative  . Not on file   Social Determinants of Health   Financial Resource  Strain:   . Difficulty of Paying Living Expenses: Not on file  Food Insecurity:   . Worried About Charity fundraiser in the Last Year: Not on file  . Ran Out of Food in the Last Year: Not on file  Transportation Needs:   . Lack of Transportation (Medical): Not on file  . Lack of Transportation (Non-Medical): Not on file  Physical Activity:   . Days of Exercise per Week: Not on file  . Minutes of Exercise per Session: Not on file  Stress:   . Feeling of Stress : Not on file  Social Connections:   . Frequency of Communication with Friends and Family: Not on file  . Frequency of Social Gatherings with Friends and Family: Not on file  . Attends Religious Services: Not on file  . Active Member of Clubs or Organizations: Not on file  . Attends Archivist Meetings:  Not on file  . Marital Status: Not on file  Intimate Partner Violence:   . Fear of Current or Ex-Partner: Not on file  . Emotionally Abused: Not on file  . Physically Abused: Not on file  . Sexually Abused: Not on file    Family History: Family History  Problem Relation Age of Onset  . Hypertension Father   . Alzheimer's disease Maternal Grandmother   . Hypertension Mother   . Diabetes Paternal Uncle   . Diabetes Cousin     Review of Systems: Review of Systems  Constitutional: Negative.   Respiratory: Negative.   Cardiovascular: Negative.   Gastrointestinal: Negative.   Neurological: Negative.     Physical Exam: Vital Signs BP 99/70 (BP Location: Right Leg, Patient Position: Sitting, Cuff Size: Normal)   Pulse 80   Temp 98.4 F (36.9 C)   Ht 5\' 6"  (1.676 m)   Wt 207 lb 6.4 oz (94.1 kg)   SpO2 100%   BMI 33.48 kg/m   Physical Exam Constitutional:      General: She is not in acute distress.    Appearance: Normal appearance. She is not ill-appearing.  HENT:     Head: Normocephalic and atraumatic.  Cardiovascular:     Rate and Rhythm: Normal rate and regular rhythm.     Pulses: Normal pulses.     Heart sounds: Normal heart sounds.  Pulmonary:     Effort: Pulmonary effort is normal. No respiratory distress.     Breath sounds: Normal breath sounds. No wheezing.  Abdominal:     General: Abdomen is flat. Bowel sounds are normal. There is no distension.     Palpations: Abdomen is soft.     Tenderness: There is no abdominal tenderness.  Musculoskeletal:        General: No swelling or tenderness.     Cervical back: Normal range of motion.     Right lower leg: No edema.     Left lower leg: No edema.  Skin:    General: Skin is warm and dry.  Neurological:     General: No focal deficit present.     Mental Status: She is alert and oriented to person, place, and time. Mental status is at baseline.  Psychiatric:        Mood and Affect: Mood normal.     Assessment/Plan: The patient is scheduled for bilateral breast reduction with Dr. Silverio Lay pace.  We thoroughly covered all the risks, benefits, and alternatives of bilateral breast reduction and general surgical interventions.  All of her questions answered.  In addition  to covering all the risks associated with breast reduction and general surgical interventions, emphasis on the risk associated with breast reduction and nipple necrosis and breast-feeding not being likely after breast reduction were covered multiple times. Patient verbalized understanding.  Prescription for postop pain control and Zofran sent to pharmacy.  Mammogram: No family history of breast cancer, no need for mammogram due to age and low risk.  Specimen to be sent to lab for review. Smoking status: Non-smoker Caprini score: 4, moderate risk, early ambulation in mechanical prophylaxis  Recommend calling with any questions or concerns prior to surgery. Covid test scheduled.  Electronically signed by: Carola Rhine Priscella Donna, PA-C 10/15/2019 12:34 PM

## 2019-10-15 NOTE — Progress Notes (Signed)
Patient ID: Joan Andrews, female    DOB: 1995-09-15, 24 y.o.   MRN: [id number]  Chief Complaint  Patient presents with  . Pre-op Exam    Patient here for H&P for BL breast reduction      ICD-10-CM   1. Macromastia  N62    History of Present Illness: Joan Andrews is a 24 y.o.  female  with a history of macromastia which causes her back pain neck pain and shoulder grooving.  She has tried muscle relaxers and anti-inflammatories.  She presents for preoperative evaluation for upcoming procedure, bilateral breast reduction, scheduled for 11/04/2019 with Dr. Silverio Lay pace.  She is currently 40 triple D and reports she would like to be around a B cup.  The patient has not had problems with anesthesia.  She reports her only other surgery was a hernia repair when she was about 24 years old.  She does recall any family history of issues with anesthesia.  She is generally healthy. No history or family history of DVT/PE.  She does not take any medications.  She does have a IUD in place, progestin releasing. She is not on any blood thinners.  No history or family history of bleeding disorders No history of any cardiac or pulmonary diseases.  She reports she has elected to undergo pedicle breast reduction instead of free nipple graft.  Past Medical History: Allergies: No Known Allergies  Current Medications:  Current Outpatient Medications:  .  methocarbamol (ROBAXIN) 500 MG tablet, Take 1 tablet (500 mg total) by mouth every 8 (eight) hours as needed for muscle spasms., Disp: 45 tablet, Rfl: 1 .  naproxen (NAPROSYN) 500 MG tablet, Take 1 tablet (500 mg total) by mouth 2 (two) times daily with a meal., Disp: 60 tablet, Rfl: 1  Past Medical Problems: Past Medical History:  Diagnosis Date  . Bronchitis    as a child only  . Contraceptive management 12/26/2013  . Miscarriage   . Pregnant 07/14/2014  . Recurrent tonsillitis   . Seizures ([place])    Questionable  .  Supervision of normal pregnancy in first trimester 07/27/2015    [place] [facility]iated Care at   7+5 weeks FOB  Stephanie Acre 23 yo bm Dating By  LMP and [place] Pap  07/27/15 GC/CT Initial:                36+wks: Genetic Screen NT/IT:  CF screen  Anatomic [place]  Flu vaccine  Tdap Recommended ~ 28wks Glucose Screen  2 hr GBS  Feed Preference  Contraception  Circumcision  Childbirth Classes  Pediatrician      Past Surgical History: Past Surgical History:  Procedure Laterality Date  . HERNIA REPAIR      Social History: Social History   Socioeconomic History  . Marital status: Single    Spouse name: Not on file  . Number of children: Not on file  . Years of education: Not on file  . Highest education level: Not on file  Occupational History  . Not on file  Tobacco Use  . Smoking status: Never Smoker  . Smokeless tobacco: Never Used  Substance and Sexual Activity  . Alcohol use: No  . Drug use: No  . Sexual activity: Yes    Partners: Male    Birth control/protection: I.U.D.  Other Topics Concern  . Not on file  Social History Narrative  . Not on file   Social Determinants of Health   Financial Resource  Strain:   . Difficulty of Paying Living Expenses: Not on file  Food Insecurity:   . Worried About Charity fundraiser in the Last Year: Not on file  . Ran Out of Food in the Last Year: Not on file  Transportation Needs:   . Lack of Transportation (Medical): Not on file  . Lack of Transportation (Non-Medical): Not on file  Physical Activity:   . Days of Exercise per Week: Not on file  . Minutes of Exercise per Session: Not on file  Stress:   . Feeling of Stress : Not on file  Social Connections:   . Frequency of Communication with Friends and Family: Not on file  . Frequency of Social Gatherings with Friends and Family: Not on file  . Attends Religious Services: Not on file  . Active Member of Clubs or Organizations: Not on file  . Attends Archivist Meetings:  Not on file  . Marital Status: Not on file  Intimate Partner Violence:   . Fear of Current or Ex-Partner: Not on file  . Emotionally Abused: Not on file  . Physically Abused: Not on file  . Sexually Abused: Not on file    Family History: Family History  Problem Relation Age of Onset  . Hypertension Father   . Alzheimer's disease Maternal Grandmother   . Hypertension Mother   . Diabetes Paternal Uncle   . Diabetes Cousin     Review of Systems: Review of Systems  Constitutional: Negative.   Respiratory: Negative.   Cardiovascular: Negative.   Gastrointestinal: Negative.   Neurological: Negative.     Physical Exam: Vital Signs BP 99/70 (BP Location: Right Leg, Patient Position: Sitting, Cuff Size: Normal)   Pulse 80   Temp 98.4 F (36.9 C)   Ht 5\' 6"  (1.676 m)   Wt 207 lb 6.4 oz (94.1 kg)   SpO2 100%   BMI 33.48 kg/m   Physical Exam Constitutional:      General: She is not in acute distress.    Appearance: Normal appearance. She is not ill-appearing.  HENT:     Head: Normocephalic and atraumatic.  Cardiovascular:     Rate and Rhythm: Normal rate and regular rhythm.     Pulses: Normal pulses.     Heart sounds: Normal heart sounds.  Pulmonary:     Effort: Pulmonary effort is normal. No respiratory distress.     Breath sounds: Normal breath sounds. No wheezing.  Abdominal:     General: Abdomen is flat. Bowel sounds are normal. There is no distension.     Palpations: Abdomen is soft.     Tenderness: There is no abdominal tenderness.  Musculoskeletal:        General: No swelling or tenderness.     Cervical back: Normal range of motion.     Right lower leg: No edema.     Left lower leg: No edema.  Skin:    General: Skin is warm and dry.  Neurological:     General: No focal deficit present.     Mental Status: She is alert and oriented to person, place, and time. Mental status is at baseline.  Psychiatric:        Mood and Affect: Mood normal.     Assessment/Plan: The patient is scheduled for bilateral breast reduction with Dr. Silverio Lay pace.  We thoroughly covered all the risks, benefits, and alternatives of bilateral breast reduction and general surgical interventions.  All of her questions answered.  In addition  to covering all the risks associated with breast reduction and general surgical interventions, emphasis on the risk associated with breast reduction and nipple necrosis and breast-feeding not being likely after breast reduction were covered multiple times. Patient verbalized understanding.  Prescription for postop pain control and Zofran sent to pharmacy.  Mammogram: No family history of breast cancer, no need for mammogram due to age and low risk.  Specimen to be sent to lab for review. Smoking status: Non-smoker Caprini score: 4, moderate risk, early ambulation in mechanical prophylaxis  Recommend calling with any questions or concerns prior to surgery. Covid test scheduled.  Electronically signed by: Carola Rhine Corra Kaine, PA-C 10/15/2019 12:34 PM

## 2019-10-28 ENCOUNTER — Encounter (HOSPITAL_BASED_OUTPATIENT_CLINIC_OR_DEPARTMENT_OTHER): Payer: Self-pay | Admitting: Plastic Surgery

## 2019-10-28 ENCOUNTER — Other Ambulatory Visit: Payer: Self-pay

## 2019-10-31 ENCOUNTER — Other Ambulatory Visit: Payer: Self-pay

## 2019-10-31 ENCOUNTER — Other Ambulatory Visit (HOSPITAL_COMMUNITY)
Admission: RE | Admit: 2019-10-31 | Discharge: 2019-10-31 | Disposition: A | Discharge status: Home / Self Care | Payer: 59 | Source: Ambulatory Visit | Attending: Plastic Surgery | Admitting: Plastic Surgery

## 2019-10-31 DIAGNOSIS — Z20822 Contact with and (suspected) exposure to covid-19: Secondary | ICD-10-CM | POA: Insufficient documentation

## 2019-10-31 DIAGNOSIS — Z01812 Encounter for preprocedural laboratory examination: Secondary | ICD-10-CM | POA: Diagnosis not present

## 2019-11-01 ENCOUNTER — Other Ambulatory Visit (HOSPITAL_COMMUNITY): Payer: 59

## 2019-11-01 LAB — SARS CORONAVIRUS 2 (TAT 6-24 HRS): SARS Coronavirus 2: NEGATIVE

## 2019-11-04 ENCOUNTER — Ambulatory Visit (HOSPITAL_BASED_OUTPATIENT_CLINIC_OR_DEPARTMENT_OTHER): Payer: 59 | Admitting: Certified Registered Nurse Anesthetist

## 2019-11-04 ENCOUNTER — Ambulatory Visit (HOSPITAL_BASED_OUTPATIENT_CLINIC_OR_DEPARTMENT_OTHER)
Admission: RE | Admit: 2019-11-04 | Discharge: 2019-11-04 | Disposition: A | Discharge status: Home / Self Care | Payer: 59 | Attending: Plastic Surgery | Admitting: Plastic Surgery

## 2019-11-04 ENCOUNTER — Other Ambulatory Visit: Payer: Self-pay

## 2019-11-04 ENCOUNTER — Encounter (HOSPITAL_BASED_OUTPATIENT_CLINIC_OR_DEPARTMENT_OTHER): Payer: Self-pay | Admitting: Plastic Surgery

## 2019-11-04 ENCOUNTER — Encounter (HOSPITAL_BASED_OUTPATIENT_CLINIC_OR_DEPARTMENT_OTHER)
Admission: RE | Disposition: A | Discharge status: Home / Self Care | Payer: Self-pay | Source: Home / Self Care | Attending: Plastic Surgery

## 2019-11-04 DIAGNOSIS — Z6833 Body mass index (BMI) 33.0-33.9, adult: Secondary | ICD-10-CM | POA: Insufficient documentation

## 2019-11-04 DIAGNOSIS — E669 Obesity, unspecified: Secondary | ICD-10-CM | POA: Insufficient documentation

## 2019-11-04 DIAGNOSIS — Z975 Presence of (intrauterine) contraceptive device: Secondary | ICD-10-CM | POA: Insufficient documentation

## 2019-11-04 DIAGNOSIS — N62 Hypertrophy of breast: Secondary | ICD-10-CM | POA: Insufficient documentation

## 2019-11-04 HISTORY — PX: BREAST REDUCTION SURGERY: SHX8

## 2019-11-04 LAB — POCT PREGNANCY, URINE: Preg Test, Ur: NEGATIVE

## 2019-11-04 SURGERY — MAMMOPLASTY, REDUCTION
Anesthesia: General | Site: Breast | Laterality: Bilateral

## 2019-11-04 MED ORDER — LACTATED RINGERS IV SOLN: INTRAVENOUS | Status: DC

## 2019-11-04 MED ORDER — DEXAMETHASONE SODIUM PHOSPHATE 10 MG/ML IJ SOLN
INTRAMUSCULAR | Status: AC
  Filled 2019-11-04: qty 1

## 2019-11-04 MED ORDER — PROMETHAZINE HCL 25 MG/ML IJ SOLN: 6.2500 mg | INTRAMUSCULAR | Status: DC | PRN

## 2019-11-04 MED ORDER — MEPERIDINE HCL 25 MG/ML IJ SOLN: 6.2500 mg | INTRAMUSCULAR | Status: DC | PRN

## 2019-11-04 MED ORDER — MIDAZOLAM HCL 2 MG/2ML IJ SOLN
INTRAMUSCULAR | Status: AC
  Filled 2019-11-04: qty 2

## 2019-11-04 MED ORDER — FENTANYL CITRATE (PF) 100 MCG/2ML IJ SOLN
INTRAMUSCULAR | Status: AC
  Filled 2019-11-04: qty 2

## 2019-11-04 MED ORDER — DEXAMETHASONE SODIUM PHOSPHATE 4 MG/ML IJ SOLN
INTRAMUSCULAR | Status: DC | PRN
  Administered 2019-11-04: 5 mg via INTRAVENOUS

## 2019-11-04 MED ORDER — ROCURONIUM BROMIDE 10 MG/ML (PF) SYRINGE
PREFILLED_SYRINGE | INTRAVENOUS | Status: AC
  Filled 2019-11-04: qty 10

## 2019-11-04 MED ORDER — GLYCOPYRROLATE PF 0.2 MG/ML IJ SOSY
PREFILLED_SYRINGE | INTRAMUSCULAR | Status: AC
  Filled 2019-11-04: qty 1

## 2019-11-04 MED ORDER — ROCURONIUM BROMIDE 100 MG/10ML IV SOLN
INTRAVENOUS | Status: DC | PRN
  Administered 2019-11-04: 100 mg via INTRAVENOUS

## 2019-11-04 MED ORDER — OXYCODONE HCL 5 MG/5ML PO SOLN: 5.0000 mg | Freq: Once | ORAL | Status: DC | PRN

## 2019-11-04 MED ORDER — PROPOFOL 10 MG/ML IV BOLUS
INTRAVENOUS | Status: DC | PRN
  Administered 2019-11-04: 50 mg via INTRAVENOUS
  Administered 2019-11-04: 150 mg via INTRAVENOUS

## 2019-11-04 MED ORDER — HYDROMORPHONE HCL 1 MG/ML IJ SOLN
INTRAMUSCULAR | Status: AC
  Filled 2019-11-04: qty 0.5

## 2019-11-04 MED ORDER — LIDOCAINE HCL (CARDIAC) PF 100 MG/5ML IV SOSY
PREFILLED_SYRINGE | INTRAVENOUS | Status: DC | PRN
  Administered 2019-11-04: 80 mg via INTRAVENOUS

## 2019-11-04 MED ORDER — LIDOCAINE 2% (20 MG/ML) 5 ML SYRINGE
INTRAMUSCULAR | Status: AC
  Filled 2019-11-04: qty 5

## 2019-11-04 MED ORDER — EPHEDRINE SULFATE 50 MG/ML IJ SOLN
INTRAMUSCULAR | Status: DC | PRN
  Administered 2019-11-04 (×4): 10 mg via INTRAVENOUS

## 2019-11-04 MED ORDER — GLYCOPYRROLATE 0.2 MG/ML IJ SOLN
INTRAMUSCULAR | Status: DC | PRN
  Administered 2019-11-04: .2 mg via INTRAVENOUS

## 2019-11-04 MED ORDER — HYDROMORPHONE HCL 1 MG/ML IJ SOLN
0.2500 mg | INTRAMUSCULAR | Status: DC | PRN
  Administered 2019-11-04: 0.5 mg via INTRAVENOUS

## 2019-11-04 MED ORDER — ACETAMINOPHEN 500 MG PO TABS
ORAL_TABLET | ORAL | Status: AC
  Filled 2019-11-04: qty 2

## 2019-11-04 MED ORDER — ONDANSETRON HCL 4 MG/2ML IJ SOLN
INTRAMUSCULAR | Status: DC | PRN
  Administered 2019-11-04: 4 mg via INTRAVENOUS

## 2019-11-04 MED ORDER — OXYCODONE HCL 5 MG PO TABS: 5.0000 mg | ORAL_TABLET | Freq: Once | ORAL | Status: DC | PRN

## 2019-11-04 MED ORDER — CEFAZOLIN SODIUM-DEXTROSE 2-4 GM/100ML-% IV SOLN
INTRAVENOUS | Status: AC
  Filled 2019-11-04: qty 100

## 2019-11-04 MED ORDER — SUGAMMADEX SODIUM 200 MG/2ML IV SOLN
INTRAVENOUS | Status: DC | PRN
  Administered 2019-11-04: 200 mg via INTRAVENOUS

## 2019-11-04 MED ORDER — CEFAZOLIN SODIUM-DEXTROSE 2-4 GM/100ML-% IV SOLN
2.0000 g | INTRAVENOUS | Status: AC
  Administered 2019-11-04: 2 g via INTRAVENOUS

## 2019-11-04 MED ORDER — PROPOFOL 10 MG/ML IV BOLUS
INTRAVENOUS | Status: AC
  Filled 2019-11-04: qty 20

## 2019-11-04 MED ORDER — MIDAZOLAM HCL 5 MG/5ML IJ SOLN
INTRAMUSCULAR | Status: DC | PRN
  Administered 2019-11-04: 2 mg via INTRAVENOUS

## 2019-11-04 MED ORDER — LACTATED RINGERS IV SOLN
INTRAVENOUS | Status: DC | PRN
  Administered 2019-11-04: 700 mL

## 2019-11-04 MED ORDER — ONDANSETRON HCL 4 MG/2ML IJ SOLN
INTRAMUSCULAR | Status: AC
  Filled 2019-11-04: qty 2

## 2019-11-04 MED ORDER — FENTANYL CITRATE (PF) 100 MCG/2ML IJ SOLN
INTRAMUSCULAR | Status: DC | PRN
  Administered 2019-11-04 (×5): 50 ug via INTRAVENOUS

## 2019-11-04 SURGICAL SUPPLY — 68 items
BAG DECANTER FOR FLEXI CONT (MISCELLANEOUS) ×3 IMPLANT
BENZOIN TINCTURE PRP APPL 2/3 (GAUZE/BANDAGES/DRESSINGS) ×6 IMPLANT
BLADE SURG 10 STRL SS (BLADE) ×6 IMPLANT
BLADE SURG 15 STRL LF DISP TIS (BLADE) IMPLANT
BLADE SURG 15 STRL SS (BLADE)
BNDG ELASTIC 6X5.8 VLCR STR LF (GAUZE/BANDAGES/DRESSINGS) ×5 IMPLANT
BNDG GAUZE ELAST 4 BULKY (GAUZE/BANDAGES/DRESSINGS) ×2 IMPLANT
CANISTER SUCT 1200ML W/VALVE (MISCELLANEOUS) ×3 IMPLANT
CHLORAPREP W/TINT 26 (MISCELLANEOUS) ×6 IMPLANT
CLIP VESOCCLUDE MED 6/CT (CLIP) IMPLANT
COVER BACK TABLE 60X90IN (DRAPES) ×3 IMPLANT
COVER MAYO STAND STRL (DRAPES) ×3 IMPLANT
COVER WAND RF STERILE (DRAPES) IMPLANT
DECANTER SPIKE VIAL GLASS SM (MISCELLANEOUS) IMPLANT
DRAIN CHANNEL 15F RND FF W/TCR (WOUND CARE) IMPLANT
DRAPE LAPAROSCOPIC ABDOMINAL (DRAPES) ×3 IMPLANT
DRAPE UTILITY XL STRL (DRAPES) ×3 IMPLANT
DRSG PAD ABDOMINAL 8X10 ST (GAUZE/BANDAGES/DRESSINGS) ×10 IMPLANT
ELECT REM PT RETURN 9FT ADLT (ELECTROSURGICAL) ×3
ELECTRODE REM PT RTRN 9FT ADLT (ELECTROSURGICAL) ×1 IMPLANT
EVACUATOR SILICONE 100CC (DRAIN) IMPLANT
GAUZE SPONGE 4X4 12PLY STRL (GAUZE/BANDAGES/DRESSINGS) ×6 IMPLANT
GLOVE BIO SURGEON STRL SZ 6.5 (GLOVE) ×1 IMPLANT
GLOVE BIO SURGEON STRL SZ7.5 (GLOVE) ×2 IMPLANT
GLOVE BIO SURGEONS STRL SZ 6.5 (GLOVE) ×1
GLOVE BIOGEL M STRL SZ7.5 (GLOVE) ×3 IMPLANT
GLOVE BIOGEL PI IND STRL 8 (GLOVE) IMPLANT
GLOVE BIOGEL PI INDICATOR 8 (GLOVE)
GLOVE ECLIPSE 6.5 STRL STRAW (GLOVE) ×2 IMPLANT
GOWN STRL REUS W/ TWL LRG LVL3 (GOWN DISPOSABLE) ×2 IMPLANT
GOWN STRL REUS W/TWL LRG LVL3 (GOWN DISPOSABLE) ×9
MARKER SKIN DUAL TIP RULER LAB (MISCELLANEOUS) IMPLANT
NDL FILTER BLUNT 18X1 1/2 (NEEDLE) ×1 IMPLANT
NDL HYPO 25X1 1.5 SAFETY (NEEDLE) IMPLANT
NDL SAFETY ECLIPSE 18X1.5 (NEEDLE) ×1 IMPLANT
NDL SPNL 18GX3.5 QUINCKE PK (NEEDLE) ×1 IMPLANT
NEEDLE FILTER BLUNT 18X 1/2SAF (NEEDLE) ×2
NEEDLE FILTER BLUNT 18X1 1/2 (NEEDLE) ×1 IMPLANT
NEEDLE HYPO 18GX1.5 SHARP (NEEDLE) ×3
NEEDLE HYPO 25X1 1.5 SAFETY (NEEDLE) IMPLANT
NEEDLE SPNL 18GX3.5 QUINCKE PK (NEEDLE) ×3 IMPLANT
NS IRRIG 1000ML POUR BTL (IV SOLUTION) ×1 IMPLANT
PACK BASIN DAY SURGERY FS (CUSTOM PROCEDURE TRAY) ×3 IMPLANT
PENCIL SMOKE EVACUATOR (MISCELLANEOUS) ×3 IMPLANT
PIN SAFETY STERILE (MISCELLANEOUS) IMPLANT
SHEET MEDIUM DRAPE 40X70 STRL (DRAPES) IMPLANT
SLEEVE SCD COMPRESS KNEE MED (MISCELLANEOUS) ×3 IMPLANT
SPONGE LAP 18X18 RF (DISPOSABLE) ×9 IMPLANT
STAPLER INSORB 30 2030 C-SECTI (MISCELLANEOUS) ×5 IMPLANT
STAPLER VISISTAT 35W (STAPLE) ×5 IMPLANT
STRIP SUTURE WOUND CLOSURE 1/2 (MISCELLANEOUS) ×9 IMPLANT
SUT ETHILON 2 0 FS 18 (SUTURE) IMPLANT
SUT MNCRL AB 4-0 PS2 18 (SUTURE) ×10 IMPLANT
SUT PDS 3-0 CT2 (SUTURE) ×6
SUT PDS II 3-0 CT2 27 ABS (SUTURE) ×2 IMPLANT
SUT VIC AB 3-0 PS1 18 (SUTURE)
SUT VIC AB 3-0 PS1 18XBRD (SUTURE) IMPLANT
SUT VLOC 90 P-14 23 (SUTURE) ×8 IMPLANT
SYR 50ML LL SCALE MARK (SYRINGE) ×6 IMPLANT
SYR BULB IRRIGATION 50ML (SYRINGE) ×3 IMPLANT
SYR CONTROL 10ML LL (SYRINGE) IMPLANT
TAPE MEASURE VINYL STERILE (MISCELLANEOUS) IMPLANT
TOWEL GREEN STERILE FF (TOWEL DISPOSABLE) ×6 IMPLANT
TRAY FOLEY W/BAG SLVR 14FR LF (SET/KITS/TRAYS/PACK) IMPLANT
TUBE CONNECTING 20'X1/4 (TUBING) ×1
TUBE CONNECTING 20X1/4 (TUBING) ×2 IMPLANT
UNDERPAD 30X36 HEAVY ABSORB (UNDERPADS AND DIAPERS) ×6 IMPLANT
YANKAUER SUCT BULB TIP NO VENT (SUCTIONS) ×3 IMPLANT

## 2019-11-04 NOTE — Discharge Instructions (Addendum)
Activity As tolerated: NO showers until 11/06/19 afternoon. Keep ACE wrap on breasts until then. After showering, put ACE wrap back on, this is important for compression. NO driving while in pain, taking pain medication or if you are unable to safely react to traffic. No heavy activities Take Pain medication (Norco) as needed for severe pain. Otherwise, you can use ibuprofen or tylenol PRN as well as indicated on your pre-op breast reduction instruction sheet provided at your pre-op appointment.  Diet: Regular. Drink plenty of fluids and eat healthy, high protein, low carbs.  Wound Care: Keep dressing clean & dry. You may change bandages after showering if you continue to notice some drainage. You can reuse bandages if they are not dirty/soiled.  Special Instructions: Call Doctor if any unusual problems occur such as pain, excessive Bleeding, unrelieved Nausea/vomiting, Fever &/or chills  Follow-up appointment: Scheduled for next week.   Post Anesthesia Home Care Instructions  Activity: Get plenty of rest for the remainder of the day. A responsible individual must stay with you for 24 hours following the procedure.  For the next 24 hours, DO NOT: -Drive a car -Paediatric nurse -Drink alcoholic beverages -Take any medication unless instructed by your physician -Make any legal decisions or sign important papers.  Meals: Start with liquid foods such as gelatin or soup. Progress to regular foods as tolerated. Avoid greasy, spicy, heavy foods. If nausea and/or vomiting occur, drink only clear liquids until the nausea and/or vomiting subsides. Call your physician if vomiting continues.  Special Instructions/Symptoms: Your throat may feel dry or sore from the anesthesia or the breathing tube placed in your throat during surgery. If this causes discomfort, gargle with warm salt water. The discomfort should disappear within 24 hours.

## 2019-11-04 NOTE — Op Note (Signed)
Operative Note   DATE OF OPERATION: 11/04/2019  LOCATION: [facility]   SURGICAL DEPARTMENT: Plastic Surgery  PREOPERATIVE DIAGNOSES: Bilateral symptomatic macromastia.  POSTOPERATIVE DIAGNOSES:  same  PROCEDURE: Bilateral breast reduction with superomedial pedicle.  SURGEON: Talmadge Coventry, MD  ASSISTANT: Verdie Shire, PA  ANESTHESIA: General.  COMPLICATIONS: None.   INDICATIONS FOR PROCEDURE:  The patient, Joan Andrews is a 24 y.o. female born on 1995/12/16, is here for treatment of bilateral symptomatic macromastia. MRN: [id number]  CONSENT:  Informed consent was obtained directly from the patient. Risks, benefits and alternatives were fully discussed. Specific risks including but not limited to bleeding, infection, hematoma, seroma, scarring, pain, infection, contracture, asymmetry, wound healing problems, and need for further surgery were all discussed. The patient did have an ample opportunity to have questions answered to satisfaction.   DESCRIPTION OF PROCEDURE:  The patient was marked preoperatively for a Wise pattern skin excision.  The patient was taken to the operating room. SCDs were placed and antibiotics were given. General anesthesia was administered.The patient's operative site was prepped and draped in a sterile fashion. A time out was performed and all information was confirmed to be correct.  Right Breast: The breast was infiltrated with tumescent solution to help with hemostasis.  The nipple was marked with a cookie cutter.  A superomedial pedicle was drawn out with the base of at least 8 cm in size.  A breast tourniquet was then applied and the pedicle was de-epithelialized.  Breast tourniquet was then let down and all incisions were made with a 10 blade.  The pedicle was then isolated down to the chest wall with cautery and the excision was performed removing tissue primarily inferiorly and laterally.  Hemostasis was obtained and the  wound was stapled closed.  Left breast:  The breast was infiltrated with tumescent solution to help with hemostasis.  The nipple was marked with a cookie cutter.  A superomedial pedicle was drawn out with the base of at least 8 cm in size.  A breast tourniquet was then applied and the pedicle was de-epithelialized.  Breast tourniquet was then let down and all incisions were made with a 10 blade.  The pedicle was then isolated down to the chest wall with cautery and the excision was performed removing tissue primarily inferiorly and laterally.  Hemostasis was obtained and the wound was stapled closed.  Patient was then set up to check for size and symmetry.  Minor modifications were made.  This resulted in a total of 1251g removed from the right side and 1116g removed from the left side.  The inframammary incision was closed with a combination of buried in-sorb staples and a running 3-0 Quill suture.  The vertical and periareolar limbs were closed with interrupted buried 4-0 Monocryl and a running 4-0 Quill suture.  Steri-Strips were then applied along with a soft dressing and Ace wrap.  The patient tolerated the procedure well.  There were no complications. The patient was allowed to wake from anesthesia, extubated and taken to the recovery room in satisfactory condition.  I was present for the entire procedure.

## 2019-11-04 NOTE — Interval H&P Note (Signed)
History and Physical Interval Note:  11/04/2019 10:03 AM  Joan Andrews  has presented today for surgery, with the diagnosis of macromastia.  The various methods of treatment have been discussed with the patient and family. After consideration of risks, benefits and other options for treatment, the patient has consented to  Procedure(s): MAMMARY REDUCTION  (BREAST) (Bilateral) as a surgical intervention.  The patient's history has been reviewed, patient examined, no change in status, stable for surgery.  I have reviewed the patient's chart and labs.  Questions were answered to the patient's satisfaction.     [name]

## 2019-11-04 NOTE — Anesthesia Procedure Notes (Signed)
Procedure Name: Intubation Date/Time: 11/04/2019 10:54 AM Performed by: Bufford Spikes, CRNA Pre-anesthesia Checklist: Patient identified, Emergency Drugs available, Suction available and Patient being monitored Patient Re-evaluated:Patient Re-evaluated prior to induction Oxygen Delivery Method: Circle system utilized Preoxygenation: Pre-oxygenation with 100% oxygen Induction Type: IV induction Ventilation: Mask ventilation without difficulty Laryngoscope Size: Miller and 2 Grade View: Grade I Tube type: Oral Tube size: 7.0 mm Number of attempts: 1 Airway Equipment and Method: Stylet and Oral airway Placement Confirmation: ETT inserted through vocal cords under direct vision,  positive ETCO2 and breath sounds checked- equal and bilateral Secured at: 21 cm Tube secured with: Tape Dental Injury: Teeth and Oropharynx as per pre-operative assessment

## 2019-11-04 NOTE — Brief Op Note (Signed)
11/04/2019  1:39 PM  PATIENT:  Annitta K Archibeque  24 y.o. female  PRE-OPERATIVE DIAGNOSIS:  macromastia  POST-OPERATIVE DIAGNOSIS:  macromastia  PROCEDURE:  Procedure(s): BILATERAL MAMMARY REDUCTION  (BREAST) (Bilateral)  SURGEON:  Surgeon(s) and Role:    * Jacquetta Polhamus, Steffanie Dunn, MD - Primary  PHYSICIAN ASSISTANT: Software engineer, PA   ASSISTANTS: none   ANESTHESIA:   general  EBL:  50 mL   BLOOD ADMINISTERED:none  DRAINS: none   LOCAL MEDICATIONS USED:  MARCAINE     SPECIMEN:  Source of Specimen:  r and l breast  DISPOSITION OF SPECIMEN:  PATHOLOGY  COUNTS:  YES  TOURNIQUET:  * No tourniquets in log *  DICTATION: .Dragon Dictation  PLAN OF CARE: Discharge to home after PACU  PATIENT DISPOSITION:  PACU - hemodynamically stable.   Delay start of Pharmacological VTE agent (>24hrs) due to surgical blood loss or risk of bleeding: not applicable

## 2019-11-04 NOTE — Transfer of Care (Signed)
Immediate Anesthesia Transfer of Care Note  Patient: Joan Andrews  Procedure(s) Performed: BILATERAL MAMMARY REDUCTION  (BREAST) (Bilateral Breast)  Patient Location: PACU  Anesthesia Type:General  Level of Consciousness: awake, alert  and oriented  Airway & Oxygen Therapy: Patient Spontanous Breathing and Patient connected to face mask oxygen  Post-op Assessment: Report given to RN and Post -op Vital signs reviewed and stable  Post vital signs: Reviewed and stable  Last Vitals:  Vitals Value Taken Time  BP 114/71 11/04/19 1357  Temp    Pulse 101 11/04/19 1400  Resp 14 11/04/19 1400  SpO2 100 % 11/04/19 1400  Vitals shown include unvalidated device data.  Last Pain:  Vitals:   11/04/19 1008  TempSrc: Tympanic  PainSc: 0-No pain         Complications: No apparent anesthesia complications

## 2019-11-04 NOTE — Anesthesia Preprocedure Evaluation (Signed)
Anesthesia Evaluation  Patient identified by MRN, date of birth, ID band Patient awake    Reviewed: Allergy & Precautions, NPO status , Patient's Chart, lab work & pertinent test results  Airway Mallampati: II  TM Distance: >3 FB Neck ROM: Full    Dental no notable dental hx.    Pulmonary neg pulmonary ROS,    Pulmonary exam normal breath sounds clear to auscultation       Cardiovascular negative cardio ROS Normal cardiovascular exam Rhythm:Regular Rate:Normal     Neuro/Psych negative neurological ROS  negative psych ROS   GI/Hepatic negative GI ROS, Neg liver ROS,   Endo/Other  negative endocrine ROS  Renal/GU negative Renal ROS  negative genitourinary   Musculoskeletal negative musculoskeletal ROS (+)   Abdominal (+) + obese,   Peds negative pediatric ROS (+)  Hematology negative hematology ROS (+)   Anesthesia Other Findings   Reproductive/Obstetrics negative OB ROS                             Anesthesia Physical Anesthesia Plan  ASA: II  Anesthesia Plan: General   Post-op Pain Management:    Induction: Intravenous  PONV Risk Score and Plan: 3 and Ondansetron, Dexamethasone, Midazolam and Treatment may vary due to age or medical condition  Airway Management Planned: Oral ETT  Additional Equipment:   Intra-op Plan:   Post-operative Plan: Extubation in OR  Informed Consent: I have reviewed the patients History and Physical, chart, labs and discussed the procedure including the risks, benefits and alternatives for the proposed anesthesia with the patient or authorized representative who has indicated his/her understanding and acceptance.     Dental advisory given  Plan Discussed with: CRNA  Anesthesia Plan Comments:         Anesthesia Quick Evaluation  

## 2019-11-04 NOTE — Anesthesia Postprocedure Evaluation (Signed)
Anesthesia Post Note  Patient: Joan Andrews  Procedure(s) Performed: BILATERAL MAMMARY REDUCTION  (BREAST) (Bilateral Breast)     Patient location during evaluation: PACU Anesthesia Type: General Level of consciousness: awake and alert Pain management: pain level controlled Vital Signs Assessment: post-procedure vital signs reviewed and stable Respiratory status: spontaneous breathing, nonlabored ventilation, respiratory function stable and patient connected to nasal cannula oxygen Cardiovascular status: blood pressure returned to baseline and stable Postop Assessment: no apparent nausea or vomiting Anesthetic complications: no    Last Vitals:  Vitals:   11/04/19 1500 11/04/19 1529  BP: 104/65 104/73  Pulse: 91 94  Resp: 17   Temp:  36.9 C  SpO2: 99% 97%    Last Pain:  Vitals:   11/04/19 1529  TempSrc:   PainSc: 2                  [name] L Jeena Arnett

## 2019-11-05 ENCOUNTER — Encounter: Payer: Self-pay | Admitting: Plastic Surgery

## 2019-11-05 ENCOUNTER — Encounter: Payer: Self-pay | Admitting: *Deleted

## 2019-11-05 LAB — SURGICAL PATHOLOGY

## 2019-11-06 ENCOUNTER — Telehealth: Payer: Self-pay | Admitting: Plastic Surgery

## 2019-11-06 NOTE — Telephone Encounter (Signed)
Tried to call patient at 9:36 AM, sent directly to voicemail. No VM was left. If patient calls back, I would recommend supplementing Hydrocodone with ibuprofen between Hydrocodone doses.

## 2019-11-06 NOTE — Telephone Encounter (Signed)
Pt called this morning and said the hydrocodone isn't helping with her pain. Please call her to advise.

## 2019-11-06 NOTE — Telephone Encounter (Signed)
Spoke with patient in regards to pain control.  She reports her last dose of Norco was approximately 6:45 AM this morning.  She reports she has only taken about 2 doses since surgery.  Pain is mostly in her breast.  Reports Norco has not provided significant relief.  I recommend taking an additional dose of Norco in approximately 1 hour.  Also recommend taking 600 mg ibuprofen every 6 hours as needed to supplement pain.   I explained that it is more difficult to catch up to the pain and to continue with scheduled dosing for now until pain has eased off.  I recommend calling if alternating Norco and ibuprofen is not helpful.  She can also add in additional Tylenol, but I can explain this to her if ibuprofen and Norco is not helpful.  She is in agreement with this plan.  She denies any fever, chills, nausea, vomiting, chest pain, shortness of breath. No other complaints.

## 2019-11-08 ENCOUNTER — Telehealth: Payer: Self-pay | Admitting: Plastic Surgery

## 2019-11-08 NOTE — Telephone Encounter (Signed)
Patient concerned about pain.  Was no maximizing motrin or tylenol.  The IBU was working but not lasting.  We discussed the alternation of the two and then the Norco for breakthrough.  She felt comfortable with the plan.  Can call back with any questions.

## 2019-11-12 ENCOUNTER — Other Ambulatory Visit: Payer: Self-pay

## 2019-11-12 ENCOUNTER — Ambulatory Visit (INDEPENDENT_AMBULATORY_CARE_PROVIDER_SITE_OTHER): Payer: 59 | Admitting: Surgical

## 2019-11-12 ENCOUNTER — Encounter: Payer: Self-pay | Admitting: Surgical

## 2019-11-12 ENCOUNTER — Encounter: Payer: Self-pay | Admitting: Plastic Surgery

## 2019-11-12 VITALS — BP 115/75 | HR 101 | Temp 98.4°F | Ht 66.0 in | Wt 207.0 lb

## 2019-11-12 DIAGNOSIS — N62 Hypertrophy of breast: Secondary | ICD-10-CM

## 2019-11-12 NOTE — Progress Notes (Signed)
The patient is a 24 year old female here for follow-up after bilateral breast reduction on 11/04/2019 by Dr. Claudia Desanctis.  Patient has had some difficulty with postop pain control, she reports that pain has slowly began to ease up a little bit.  She is continuing to take Norco, ibuprofen, Tylenol as needed.  She has continued to wear Ace wrap around her bilateral breasts.  She reports a chronic headache, all over, feels like a tight rubber band, better with Tylenol, but does not completely resolve.  She does not have any vision changes, hearing changes, imbalance, dizziness.  She does endorse some numbness along the posterior scalp. She reports that she is eating and drinking normally.  Voiding normally. No f/c/n/v.  No shortness of breath or chest pain.  No leg pain.  Chaperone present on exam. On exam today bilateral breasts are soft, they are tender to palpation, some swelling noted bilaterally.  Steri-Strips in place.  Incisions are C/D/I, no dehiscence noted.  NAC complexes good color.  Right breast slightly more swollen than left, but not significantly.  No erythema noted.  No drainage noted.  No drainage noted on dressing.  Plan: I did aspirate right breast with butterfly needle using a sterile technique.  No fluid was collected.  Patient tolerated this fine without any complications.  Recommend continuing to wrap breast with Ace wrap or sports bra.  Patient reports that she likes the Ace wrap, it provides comfort.  There is no sign of any infection, no sign of large fluid collection or hematoma at this time.  She may have a small seroma, but I was unable to aspirate.  Recommend continuing to monitor, continue with compression, call if she develops any increased swelling, increased pain, fevers, chills, nausea, vomiting, chest pain, shortness of breath.  Patient in agreement with plan.  For headache recommend drinking more water, continue to take Tylenol, ibuprofen, Norco as needed.  Recommend calling  if symptoms worsen.  Recommend following up in 1 week.  Calling with any questions or concerns prior to next appointment.

## 2019-11-15 ENCOUNTER — Encounter: Payer: Self-pay | Admitting: Surgical

## 2019-11-15 NOTE — Progress Notes (Signed)
Patient is a 24 year old female status post breast reduction. I saw her last week, 3 days ago (Wednesday)she had a little bit of swelling at that time, attempted to aspirate right breast, this was unsuccessful.   She reports that today she noticed a little bit more swelling, increased pain. She's not having any fevers, chills, nausea, vomiting. She's feeling fine otherwise. No issues eating or drinking. I recommend keeping the bre asts wrapped with ace wrap to decrease chance of increased swelling, continue with normal activities as tolerated, but avoid excess movement of right upper extremity or lifting. Recommend calling if she develops any worsening symptoms.  She's in agreement with plan, she will call if she develops any changes in her symptoms. I will reach out to the clinical staff to have them schedule an appointment for her next week to be evaluate d.

## 2019-11-19 ENCOUNTER — Encounter: Payer: 59 | Admitting: Plastic Surgery

## 2019-11-19 ENCOUNTER — Other Ambulatory Visit: Payer: Self-pay

## 2019-11-19 ENCOUNTER — Encounter: Payer: Self-pay | Admitting: Plastic Surgery

## 2019-11-19 ENCOUNTER — Ambulatory Visit (INDEPENDENT_AMBULATORY_CARE_PROVIDER_SITE_OTHER): Payer: 59 | Admitting: Plastic Surgery

## 2019-11-19 VITALS — BP 109/77 | HR 56 | Temp 98.2°F | Ht 66.0 in | Wt 210.0 lb

## 2019-11-19 DIAGNOSIS — N62 Hypertrophy of breast: Secondary | ICD-10-CM

## 2019-11-19 NOTE — Progress Notes (Signed)
Patient is postop from a bilateral breast reduction.  Her surgery was about 2 weeks ago.  She is doing well overall but complains of lateral fullness on both sides right worse than the left.  An aspiration attempt was made on the right side previously.  On exam all of her incisions are healing fine and the nipple areolar complexes are viable.  She does have some fullness on each side laterally.  I attempted to aspirate on the right side but was unable to get any fluid back.  I have asked her to continue to wear compressive garment and avoid strenuous activities.  We will plan to see her again in 3 weeks.

## 2019-12-09 DIAGNOSIS — Z9889 Other specified postprocedural states: Secondary | ICD-10-CM | POA: Insufficient documentation

## 2019-12-09 NOTE — Progress Notes (Deleted)
Patient is a 24 year old female here for follow-up after undergoing bilateral breast reduction on 11/04/2019 with Dr. Claudia Desanctis.  5 weeks post-op

## 2019-12-10 ENCOUNTER — Other Ambulatory Visit: Payer: Self-pay

## 2019-12-10 ENCOUNTER — Encounter: Payer: Self-pay | Admitting: Plastic Surgery

## 2019-12-10 ENCOUNTER — Ambulatory Visit (INDEPENDENT_AMBULATORY_CARE_PROVIDER_SITE_OTHER): Payer: 59 | Admitting: Plastic Surgery

## 2019-12-10 ENCOUNTER — Ambulatory Visit: Payer: 59 | Admitting: Plastic Surgery

## 2019-12-10 VITALS — BP 118/84 | HR 75 | Temp 98.2°F | Ht 66.0 in | Wt 208.4 lb

## 2019-12-10 DIAGNOSIS — Z9889 Other specified postprocedural states: Secondary | ICD-10-CM

## 2019-12-10 NOTE — Progress Notes (Signed)
Patient is a 24 year old female who presents for follow-up after undergoing bilateral breast reduction on 11/03/29 with Dr. Claudia Desanctis. 5 weeks PO.  Today patient reports she feels she is healing well from her breast reduction. Some numbness reports on lateral aspect of left breast.  Incisions are healing very well, C/D/I.  No signs of infection, redness, drainage.  She does have some diffuse swelling bilaterally. Last week she visited the ED for some pain in her left arm, in area of her tricep.  She was given prednisone and diclofenac gel which she feel has not helped much.   Referral made to Physical Therapy for upper extremity pain, ROM, and strength. Follow up in 3 weeks. Call office with any questions/concerns.  The [place] was signed into law in 2016 which includes the topic of electronic health records.  This provides immediate access to information in MyChart.  This includes consultation notes, operative notes, office notes, lab results and pathology reports.  If you have any questions about what you read please let us know at your next visit or call us at the office.  We are right here with you.

## 2019-12-19 ENCOUNTER — Ambulatory Visit (HOSPITAL_COMMUNITY): Payer: 59 | Attending: Plastic Surgery

## 2019-12-19 ENCOUNTER — Other Ambulatory Visit: Payer: Self-pay

## 2019-12-19 ENCOUNTER — Encounter (HOSPITAL_COMMUNITY): Payer: Self-pay

## 2019-12-19 DIAGNOSIS — M25611 Stiffness of right shoulder, not elsewhere classified: Secondary | ICD-10-CM | POA: Diagnosis present

## 2019-12-19 DIAGNOSIS — M25612 Stiffness of left shoulder, not elsewhere classified: Secondary | ICD-10-CM | POA: Insufficient documentation

## 2019-12-19 DIAGNOSIS — R29898 Other symptoms and signs involving the musculoskeletal system: Secondary | ICD-10-CM | POA: Diagnosis not present

## 2019-12-19 DIAGNOSIS — M25512 Pain in left shoulder: Secondary | ICD-10-CM | POA: Insufficient documentation

## 2019-12-19 NOTE — Therapy (Signed)
[facility] [place], [place], [address] Phone: [phone]   Fax:  [phone]  Occupational Therapy Evaluation  Patient Details  Name: Joan Andrews MRN: [id number] Date of Birth: 22-Mar-1996 Referring Provider (OT): Phoebe Sharps [place]   Encounter Date: 12/19/2019  OT End of Session - 12/19/19 1259    Visit Number  1    Number of Visits  8    Date for OT Re-Evaluation  01/16/20    Authorization Type  [employer]    Authorization Time Period  PT/)O combined: 30 visits with 0 used. No authorization needed.    Authorization - Visit Number  1    Authorization - Number of Visits  30    OT Start Time  1030    OT Stop Time  1118    OT Time Calculation (min)  48 min    Activity Tolerance  Patient tolerated treatment well    Behavior During Therapy  WFL for tasks assessed/performed       Past Medical History:  Diagnosis Date  . Bronchitis    as a child only  . Contraceptive management 12/26/2013  . Recurrent tonsillitis   . Seizures ([facility])    Questionable x1    Past Surgical History:  Procedure Laterality Date  . BREAST REDUCTION SURGERY Bilateral 11/04/2019   Procedure: BILATERAL MAMMARY REDUCTION  (BREAST);  Surgeon: [name], MD;  Location: [facility];  Service: Plastics;  Laterality: Bilateral;  . HERNIA REPAIR      There were no vitals filed for this visit.  Subjective Assessment - 12/19/19 1037    Subjective   S: I'm arrived to do too much with my arm because I don't want it to hurt.    Pertinent History  Patient is a 24 y/o female S/P bilateral breast reduction on 11/04/19 and is now experiencing increased RUE pain and bilateral increased fasical restrictions, decreased ROM and strength. Phoebe Sharps, PA-C has referred patient to occupational therapy for evaluation and treatment.    Patient Stated Goals  To be pain free and be able to use her arms without fear of pain.    Currently  in Pain?  Yes    Pain Score  9    Only for a second and then it gone.   Pain Orientation  Left    Pain Descriptors / Indicators  Burning;Tender    Pain Type  Acute pain    Pain Radiating Towards  N/A    Pain Onset  1 to 4 weeks ago    Pain Frequency  Intermittent    Aggravating Factors   No cause    Pain Relieving Factors  nothing is used.    Effect of Pain on Daily Activities  no effect    Multiple Pain Sites  No        [facility] OT Assessment - 12/19/19 1040      Assessment   Medical Diagnosis  left arm pain S/P bilateral breast reduction    Referring Provider (OT)  Phoebe Sharps PA    Onset Date/Surgical Date  11/04/19    Hand Dominance  Right    Next MD Visit  --   schedule after two visits with therapy   Prior Therapy  None      Precautions   Precautions  None      Restrictions   Weight Bearing Restrictions  No      Balance Screen   Has the  patient fallen in the past 6 months  No      Home  Environment   Family/patient expects to be discharged to:  Private residence      Prior Function   Level of Independence  Independent    Vocation  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 She is able to schedule her own hours. She has not returned to work yet although she is cleared to return next week.     Leisure  Patient has two children. 78 y/o and 3 y/o.       ADL   ADL comments  No difficulty completing any daily tasks.  Very cautious with anything she does. Has two young kids at home 48 y/o and 40 y/o.       Mobility   Mobility Status  Independent      Written Expression   Dominant Hand  Right      Vision - History   Baseline Vision  No visual deficits      Cognition   Overall Cognitive Status  Within Functional Limits for tasks assessed      Observation/Other Assessments   Focus on Therapeutic Outcomes (FOTO)   N/A      Posture/Postural Control   Posture/Postural Control  Postural limitations    Postural  Limitations  Rounded Shoulders;Forward head      Sensation   Additional Comments  Pt reports numbness in the posterior upper arm proximal to the elbow (inferior tricep). When completing 90 degrees shoulder abduction she experiences numbness of her entire hand.    Possible dermatome C-7?     ROM / Strength   AROM / PROM / Strength  AROM;PROM;Strength      Palpation   Palpation comment  Max fascial restrictions in the left upper arm/ anterior deltoid region.       AROM   Overall AROM Comments  assessed seated. IR/er abducted    AROM Assessment Site  Shoulder    Right/Left Shoulder  Left;Right    Right Shoulder Flexion  148 Degrees    Right Shoulder ABduction  129 Degrees    Right Shoulder Internal Rotation  65 Degrees    Right Shoulder External Rotation  80 Degrees    Left Shoulder Flexion  126 Degrees   hand numbness during this   Left Shoulder ABduction  110 Degrees   hand numbness during this   Left Shoulder Internal Rotation  40 Degrees   no issues with numbness   Left Shoulder External Rotation  67 Degrees   no issues with numbness     PROM   Overall PROM Comments  Assessed supine. IR/er abducted    PROM Assessment Site  Shoulder    Right/Left Shoulder  Right;Left    Right Shoulder Flexion  155 Degrees    Right Shoulder ABduction  180 Degrees    Right Shoulder Internal Rotation  90 Degrees    Right Shoulder External Rotation  90 Degrees    Left Shoulder Flexion  110 Degrees    Left Shoulder ABduction  105 Degrees    Left Shoulder Internal Rotation  55 Degrees    Left Shoulder External Rotation  55 Degrees      Strength   Overall Strength Comments  Assessed seated. IR/er abducted    Strength Assessment Site  Shoulder    Right/Left Shoulder  Right;Left    Right Shoulder Flexion  4+/5    Right Shoulder  ABduction  5/5    Right Shoulder Internal Rotation  5/5    Right Shoulder External Rotation  5/5    Left Shoulder Flexion  4+/5    Left Shoulder ABduction  5/5     Left Shoulder Internal Rotation  4+/5    Left Shoulder External Rotation  4/5                      OT Education - 12/19/19 1258    Education Details  shoulder stretches, desensitization techniques, ulnar nerve glide    Person(s) Educated  Patient    Methods  Explanation;Demonstration;Verbal cues;Handout    Comprehension  Returned demonstration;Verbalized understanding       OT Short Term Goals - 12/19/19 1322      OT SHORT TERM GOAL #1   Title  Patient will be educated and independent with HEP in order to faciliate progress in therapy and increase her ability to complete all needed daily and work related tasks with less difficulty and pain.    Time  4    Period  Weeks    Status  New    Target Date  01/16/20      OT SHORT TERM GOAL #2   Title  Patient will increase her BUE A/ROM to WNL in order to complete all needed reaching tasks such as flexion and abduction without difficulty.    Time  4    Period  Weeks    Status  New      OT SHORT TERM GOAL #3   Title  Patient will report a decrease in pain of approximately 3/10 or less when using her LUE for any self care and work task.    Time  4    Period  Weeks    Status  New      OT SHORT TERM GOAL #4   Title  Patient will increase her BUE strength to 5/5 in order to confidently return to work and complete required duties.    Time  4    Period  Weeks    Status  New      OT SHORT TERM GOAL #5   Title  Patient will decrease LUE fascial restrictions to min amount or less in order to increase the functional mobility needed to complete high level reaching tasks.    Time  4    Period  Weeks    Status  New      Additional Short Term Goals   Additional Short Term Goals  Yes      OT SHORT TERM GOAL #6   Title  Patient will report a decrease in sensitivity in her LUE with carry over of desensitization techniques and HEP provided during sessions.    Time  4    Period  Weeks    Status  New                Plan - 12/19/19 1303    Clinical Impression Statement  A: Patient is a 24 y/o female S/P bilateral breast reduction and experiencing a new onset of left UE pain and hypersensitivity, BUE decreased ROM and strength resulting in difficulty completing needed daily tasks due to pain and discomfort.    OT Occupational Profile and History  Problem Focused Assessment - Including review of records relating to presenting problem    Occupational performance deficits (Please refer to evaluation for details):  ADL's;IADL's;Leisure;Work    Marketing executive / Function / Physical Skills  ADL;ROM;Sensation;Fascial restriction;Strength;Pain;UE functional use    Rehab Potential  Good    Clinical Decision Making  Several treatment options, min-mod task modification necessary    Comorbidities Affecting Occupational Performance:  None    Modification or Assistance to Complete Evaluation   No modification of tasks or assist necessary to complete eval    OT Frequency  2x / week    OT Duration  4 weeks    OT Treatment/Interventions  Self-care/ADL training;Therapeutic exercise;Manual Therapy;Neuromuscular education;Ultrasound;Therapeutic activities;DME and/or AE instruction;Cryotherapy;Electrical Stimulation;Moist Heat;Passive range of motion;Patient/family education    Plan  P: patient will benefit from skilled OT services to increase functional performance during ADL and work activities while experiencing less pain and numbness in LUE and greater ROM and strength in BUE. Treatment Plan: Myofascial release BUE, passive stretching to LUE only, A/ROM, general shoulder strengthening. Next session: Assess success with ulnar nerve glide and assess radial nerve glide if needed.    OT Home Exercise Plan  eval: shoulder stretches, desensitization techniques, ulnar nerve glide    Consulted and Agree with Plan of Care  Patient       Patient will benefit from skilled therapeutic intervention in order to improve  the following deficits and impairments:   Body Structure / Function / Physical Skills: ADL, ROM, Sensation, Fascial restriction, Strength, Pain, UE functional use       Visit Diagnosis: Other symptoms and signs involving the musculoskeletal system - Plan: Ot plan of care cert/re-cert  Acute pain of left shoulder - Plan: Ot plan of care cert/re-cert  Stiffness of left shoulder, not elsewhere classified - Plan: Ot plan of care cert/re-cert  Stiffness of right shoulder, not elsewhere classified - Plan: Ot plan of care cert/re-cert    Problem List Patient Active Problem List   Diagnosis Date Noted  . S/P bilateral breast reduction 12/09/2019  . Obesity (BMI 30-39.9) 08/27/2019  . Encounter for IUD insertion 03/07/2018  . Seizure-like activity ([facility]) 10/03/2017  . Uterine fibroid 07/24/2017  . History of chlamydia 02/16/2016   Ailene Ravel, OTR/L,CBIS  [phone]  12/19/2019, 1:29 PM  [facility] [place], [place], [address] Phone: [phone]   Fax:  [phone]  Name: LIESHA CLINTON MRN: [id number] Date of Birth: Jan 07, 1996

## 2019-12-19 NOTE — Patient Instructions (Addendum)
Complete the following exercises 2-3 times a day.  Doorway Stretch  Place each hand opposite each other on the doorway. (You can change where you feel the stretch by moving arms higher or lower.) Step through with one foot and bend front knee until a stretch is felt and hold. Step through with the opposite foot on the next rep. Hold for __30___ seconds. Repeat __2__times.     Scapular Retraction (Standing)   With arms at sides, pinch shoulder blades together. Repeat __10__ times per set. Do __1__ sets per session. Do __2__ sessions per day.     Posterior Capsule Stretch   Stand or sit, one arm across body so hand rests over opposite shoulder. Gently push on crossed elbow with other hand until stretch is felt in shoulder of crossed arm. Hold _30__ seconds.  Repeat _2__ times per session. Do ___ sessions per day.   Wall Flexion  Slide your arm up the wall or door frame until a stretch is felt in your shoulder . Hold for 30 seconds. Complete 2 times     Shoulder Abduction Stretch  Stand side ways by a wall with affected up on wall. Gently step in toward wall to feel stretch. Hold for 30 seconds. Complete 2 times.    Desensitization Techniques  Perform these exercises every 2 hours for 15 minute sessions.  Progress to the next exercises when the exercises you are doing become easy.  1)  Using light pressure, rub the various textures along with the hypersensitive area:  A.  Wallula  C.  Wool  G.  Cotton material  D.  Terry cloth  2)  With the same textures use a firmer pressure. 3)  Use a hand held vibrator and massage along the sensitive area. 4)  With a small dowel rod, eraser on a pencil or base of an ink pen tap along the sensitive area. 5)  Use an empty roll-on deodorant bottle to roll along the sensitive area.

## 2019-12-22 ENCOUNTER — Telehealth (HOSPITAL_COMMUNITY): Payer: Self-pay

## 2019-12-22 ENCOUNTER — Encounter (HOSPITAL_COMMUNITY): Payer: 59

## 2019-12-22 NOTE — Telephone Encounter (Signed)
Called and left a message regarding no show. Reminded patient of next appointment and to call if she is unable to make it.   Ailene Ravel, OTR/L,CBIS  [phone]

## 2019-12-23 ENCOUNTER — Telehealth (HOSPITAL_COMMUNITY): Payer: Self-pay

## 2019-12-23 ENCOUNTER — Telehealth (HOSPITAL_COMMUNITY): Payer: Self-pay | Admitting: Occupational Therapy

## 2019-12-23 NOTE — Telephone Encounter (Signed)
pt called to cx all of her appts stated that she is doing better with the exercises that she does at home. pt wants to be discharged.

## 2019-12-26 ENCOUNTER — Ambulatory Visit (HOSPITAL_COMMUNITY): Payer: 59 | Admitting: Occupational Therapy

## 2020-01-02 ENCOUNTER — Encounter (HOSPITAL_COMMUNITY): Payer: Self-pay | Admitting: Occupational Therapy

## 2020-01-02 NOTE — Therapy (Signed)
[facility] [address] [place], [place], [address] Phone: [phone]   Fax:  [phone]  Patient Details  Name: CHIVON LEPAGE MRN: [id number] Date of Birth: 1995-09-24 Referring Provider:  No ref. provider found  Encounter Date: 01/02/2020   OCCUPATIONAL THERAPY DISCHARGE SUMMARY  Visits from Start of Care: 1  Current functional level related to goals / functional outcomes: Unknown. Pt called and requested discharge. Only attended evaluation and reports improvement with HEP.   Remaining deficits: Unknown.    Education / Equipment: HEP at evaluation Plan: Patient agrees to discharge.  Patient goals were not met. Patient is being discharged due to the patient's request.  ?????      Guadelupe Sabin, OTR/L  [phone] 01/02/2020, 7:58 AM  [facility] [address] [place], [place], [address] Phone: [phone]   Fax:  [phone]

## 2020-01-05 ENCOUNTER — Encounter (HOSPITAL_COMMUNITY): Payer: 59 | Admitting: Occupational Therapy

## 2020-01-09 ENCOUNTER — Encounter (HOSPITAL_COMMUNITY): Payer: 59 | Admitting: Occupational Therapy

## 2020-01-13 NOTE — Progress Notes (Signed)
Patient is a 24 year old female who presents for follow-up after undergoing bilateral breast reduction on 11/04/2019 with Dr. Claudia Desanctis.  ~ 10 weeks PO Patient reports she is doing very well and very pleased with her results.  Incisions are healing extremely well, C/D/I.  No signs of infection, redness, drainage, seroma/hematoma.  Patient denies fever, chest pain, shortness of breath, nausea/vomiting.Referral made to physical therapy at last visit on 12/10/2019.  Patient reports they gave her home exercises which helped greatly to improve her range of motion, strength, and reduce pain.  Continue to wear sports bra during the day until 3 months postop.  Then may switch to a regular bra.  Try to avoid underwire if possible.  No restrictions on activities, increase gradually as tolerated.  May shower normally, go swimming and take baths.  May apply Vaseline, cocoa butter, or other unscented moisturizer to breasts.  May also utilize silicone or Mederma or other scar cream as desired.  Follow-up as needed.  Call office with any questions/concerns.  Pictures were obtained of the patient and placed in the chart with the patient's or guardian's permission.  The [place] was signed into law in 2016 which includes the topic of electronic health records.  This provides immediate access to information in MyChart.  This includes consultation notes, operative notes, office notes, lab results and pathology reports.  If you have any questions about what you read please let us know at your next visit or call us at the office.  We are right here with you.

## 2020-01-14 ENCOUNTER — Encounter (HOSPITAL_COMMUNITY): Payer: 59 | Admitting: Occupational Therapy

## 2020-01-15 ENCOUNTER — Ambulatory Visit (INDEPENDENT_AMBULATORY_CARE_PROVIDER_SITE_OTHER): Payer: 59 | Admitting: Plastic Surgery

## 2020-01-15 ENCOUNTER — Other Ambulatory Visit: Payer: Self-pay

## 2020-01-15 ENCOUNTER — Encounter: Payer: Self-pay | Admitting: Plastic Surgery

## 2020-01-15 VITALS — BP 108/74 | HR 81 | Temp 98.4°F | Ht 66.0 in | Wt 209.6 lb

## 2020-01-15 DIAGNOSIS — Z9889 Other specified postprocedural states: Secondary | ICD-10-CM

## 2020-01-16 ENCOUNTER — Encounter (HOSPITAL_COMMUNITY): Payer: 59 | Admitting: Occupational Therapy

## 2020-02-11 ENCOUNTER — Encounter: Payer: Self-pay | Admitting: Family Medicine

## 2020-03-15 ENCOUNTER — Encounter: Payer: Self-pay | Admitting: Family Medicine

## 2020-03-24 ENCOUNTER — Ambulatory Visit: Payer: Self-pay

## 2020-04-07 ENCOUNTER — Encounter: Payer: Self-pay | Admitting: Family Medicine

## 2020-04-12 ENCOUNTER — Encounter: Payer: Self-pay | Admitting: Family Medicine

## 2020-05-03 ENCOUNTER — Other Ambulatory Visit: Payer: Self-pay

## 2020-05-03 ENCOUNTER — Emergency Department (HOSPITAL_COMMUNITY): Admission: EM | Admit: 2020-05-03 | Discharge: 2020-05-03 | Discharge status: Against Medical Advice | Payer: 59

## 2020-05-07 ENCOUNTER — Encounter: Payer: Self-pay | Admitting: Family Medicine

## 2020-05-13 ENCOUNTER — Ambulatory Visit: Payer: 59 | Admitting: Family Medicine

## 2020-08-05 ENCOUNTER — Telehealth: Payer: Self-pay | Admitting: Family Medicine

## 2020-08-05 ENCOUNTER — Other Ambulatory Visit: Payer: 59

## 2020-08-05 DIAGNOSIS — Z20822 Contact with and (suspected) exposure to covid-19: Secondary | ICD-10-CM

## 2020-08-05 NOTE — Telephone Encounter (Signed)
ERROR

## 2020-08-07 LAB — SARS-COV-2, NAA 2 DAY TAT

## 2020-08-07 LAB — NOVEL CORONAVIRUS, NAA: SARS-CoV-2, NAA: NOT DETECTED

## 2020-08-11 ENCOUNTER — Encounter (HOSPITAL_COMMUNITY): Payer: Self-pay | Admitting: *Deleted

## 2020-08-11 ENCOUNTER — Other Ambulatory Visit: Payer: Self-pay

## 2020-08-11 ENCOUNTER — Emergency Department (HOSPITAL_COMMUNITY)
Admission: EM | Admit: 2020-08-11 | Discharge: 2020-08-12 | Disposition: A | Discharge status: Home Health | Payer: 59 | Attending: Emergency Medicine | Admitting: Emergency Medicine

## 2020-08-11 DIAGNOSIS — Z20822 Contact with and (suspected) exposure to covid-19: Secondary | ICD-10-CM | POA: Diagnosis not present

## 2020-08-11 DIAGNOSIS — R509 Fever, unspecified: Secondary | ICD-10-CM | POA: Diagnosis present

## 2020-08-11 DIAGNOSIS — B349 Viral infection, unspecified: Secondary | ICD-10-CM | POA: Diagnosis not present

## 2020-08-11 LAB — RESP PANEL BY RT-PCR (FLU A&B, COVID) ARPGX2
Influenza A by PCR: NEGATIVE
Influenza B by PCR: NEGATIVE
SARS Coronavirus 2 by RT PCR: NEGATIVE

## 2020-08-11 MED ORDER — ACETAMINOPHEN 325 MG PO TABS
650.0000 mg | ORAL_TABLET | Freq: Once | ORAL | Status: AC
  Administered 2020-08-11: 650 mg via ORAL
  Filled 2020-08-11: qty 2

## 2020-08-11 NOTE — ED Provider Notes (Signed)
[facility] EMERGENCY DEPARTMENT Provider Note   CSN: [id number] Arrival date & time: 08/11/20  2021     History Chief Complaint  Patient presents with  . Fever    Joan Andrews is a 24 y.o. female.  The history is provided by the patient.  Fever Max temp prior to arrival:  101 Severity:  Moderate Onset quality:  Gradual Duration:  1 day Timing:  Constant Progression:  Unchanged Chronicity:  New Worsened by:  Nothing Associated symptoms: chills, headaches and myalgias   Associated symptoms: no chest pain, no cough, no diarrhea, no dysuria, no nausea, no sore throat and no vomiting   Risk factors: sick contacts   Risk factors: no recent travel   Patient reports fever, fatigue, myalgias over the past day.  No vomiting or diarrhea.  No cough or sore throat.  No recent travel.  No dysuria.  She reports Covid sick contact at work.     Past Medical History:  Diagnosis Date  . Bronchitis    as a child only  . Contraceptive management 12/26/2013  . Recurrent tonsillitis   . Seizures ([facility])    Questionable x1    Patient Active Problem List   Diagnosis Date Noted  . S/P bilateral breast reduction 12/09/2019  . Obesity (BMI 30-39.9) 08/27/2019  . Encounter for IUD insertion 03/07/2018  . Seizure-like activity ([facility]) 10/03/2017  . Uterine fibroid 07/24/2017  . History of chlamydia 02/16/2016    Past Surgical History:  Procedure Laterality Date  . BREAST REDUCTION SURGERY Bilateral 11/04/2019   Procedure: BILATERAL MAMMARY REDUCTION  (BREAST);  Surgeon: Allena Napoleon, MD;  Location: [facility];  Service: Plastics;  Laterality: Bilateral;  . HERNIA REPAIR       OB History    Gravida  3   Para  2   Term  2   Preterm      AB  1   Living  2     SAB  1   IAB      Ectopic      Multiple  0   Live Births  2           Family History  Problem Relation Age of Onset  . Hypertension Father   . Alzheimer's disease Maternal  Grandmother   . Hypertension Mother   . Diabetes Paternal Uncle   . Diabetes Cousin     Social History   Tobacco Use  . Smoking status: Never Smoker  . Smokeless tobacco: Never Used  Substance Use Topics  . Alcohol use: No  . Drug use: No    Home Medications Prior to Admission medications   Medication Sig Start Date End Date Taking? Authorizing Provider  levonorgestrel (MIRENA) 20 MCG/24HR IUD 1 each by Intrauterine route once.    [provider]    Allergies    Patient has no known allergies.  Review of Systems   Review of Systems  Constitutional: Positive for chills, fatigue and fever.  HENT: Negative for sore throat.   Respiratory: Negative for cough.   Cardiovascular: Negative for chest pain.  Gastrointestinal: Negative for diarrhea, nausea and vomiting.  Genitourinary: Negative for dysuria, vaginal bleeding and vaginal discharge.  Musculoskeletal: Positive for myalgias.  Neurological: Positive for headaches.  All other systems reviewed and are negative.   Physical Exam Updated Vital Signs BP 90/67 (BP Location: Left Arm)   Pulse (!) 103   Temp 98.9 F (37.2 C) (Oral)   Resp (!) 23  Ht 1.676 m (5\' 6" )   Wt 95.3 kg   SpO2 98%   BMI 33.89 kg/m   Physical Exam CONSTITUTIONAL: Well developed/well nourished HEAD: Normocephalic/atraumatic EYES: EOMI/PERRL ENMT: Mucous membranes moist, uvula midline without erythema or exudates. Tonsillar hypertrophy is noted bilaterally NECK: supple no meningeal signs SPINE/BACK:entire spine nontender CV: S1/S2 noted, no murmurs/rubs/gallops noted LUNGS: Lungs are clear to auscultation bilaterally, no apparent distress ABDOMEN: soft, nontender, no rebound or guarding, bowel sounds noted throughout abdomen GU:no cva tenderness NEURO: Pt is awake/alert/appropriate, moves all extremitiesx4.  No facial droop.   EXTREMITIES: pulses normal/equal, full ROM SKIN: warm, color normal PSYCH: no abnormalities of mood  noted, alert and oriented to situation  ED Results / Procedures / Treatments   Labs (all labs ordered are listed, but only abnormal results are displayed) Labs Reviewed  URINALYSIS, ROUTINE W REFLEX MICROSCOPIC - Abnormal; Notable for the following components:      Result Value   APPearance HAZY (*)    Specific Gravity, Urine 1.033 (*)    Ketones, ur 20 (*)    Protein, ur 30 (*)    Leukocytes,Ua MODERATE (*)    Bacteria, UA RARE (*)    All other components within normal limits  BASIC METABOLIC PANEL - Abnormal; Notable for the following components:   Sodium 134 (*)    Calcium 8.3 (*)    All other components within normal limits  RESP PANEL BY RT-PCR (FLU A&B, COVID) ARPGX2  URINE CULTURE  PREGNANCY, URINE  CBC WITH DIFFERENTIAL/PLATELET  LACTIC ACID, PLASMA    EKG None  Radiology No results found.  Procedures Procedures  Medications Ordered in ED Medications  acetaminophen (TYLENOL) tablet 650 mg (650 mg Oral Given 08/11/20 2343)  lactated ringers bolus 2,000 mL (0 mLs Intravenous Stopped 08/12/20 0244)    ED Course  I have reviewed the triage vital signs and the nursing notes.  Pertinent labs  results that were available during my care of the patient were reviewed by me and considered in my medical decision making (see chart for details).    MDM Rules/Calculators/A&P                          11:42 PM Patient reports fever and fatigue.  She is very well-appearing at this time.  Repeat blood pressure is 99 systolic and heart rate is less than 100.  Patient reports she typically has low blood pressure. Covid testing is negative.  Will check urinalysis and reassess 12:55 AM Urinalysis is unremarkable for acute process.  However patient is mildly tachycardic and blood pressures dipped to the 80s.  Patient does report feeling lightheaded upon standing.  She is likely dehydrated We will give IV fluids and reassess.  We will also check labs 2:45 AM Patient is much  improved.  Heart rate is now in the 80s.  Blood pressure is 119 systolic. Patient is not septic appearing.  Lactic acid is negative Patient will be discharged home. Suspect viral illness.  Joan Andrews was evaluated in Emergency Department on 08/11/2020 for the symptoms described in the history of present illness. She was evaluated in the context of the global COVID-19 pandemic, which necessitated consideration that the patient might be at risk for infection with the SARS-CoV-2 virus that causes COVID-19. Institutional protocols and algorithms that pertain to the evaluation of patients at risk for COVID-19 are in a state of rapid change based on information released by regulatory bodies including the  CDC and federal and [employer]. These policies and algorithms were followed during the patient's care in the ED.  Final Clinical Impression(s) / ED Diagnoses Final diagnoses:  Viral syndrome    Rx / DC Orders ED Discharge Orders    None       Zadie Rhine, MD 08/12/20 [phone]

## 2020-08-11 NOTE — ED Triage Notes (Signed)
Pt with fever and body aches since yesterday, pt was exposed by a coworker for covid and had tested negative last week.

## 2020-08-12 LAB — CBC WITH DIFFERENTIAL/PLATELET
Abs Immature Granulocytes: 0.01 10*3/uL (ref 0.00–0.07)
Basophils Absolute: 0 10*3/uL (ref 0.0–0.1)
Basophils Relative: 0 %
Eosinophils Absolute: 0 10*3/uL (ref 0.0–0.5)
Eosinophils Relative: 0 %
HCT: 41.2 % (ref 36.0–46.0)
Hemoglobin: 12.8 g/dL (ref 12.0–15.0)
Immature Granulocytes: 0 %
Lymphocytes Relative: 30 %
Lymphs Abs: 1.4 10*3/uL (ref 0.7–4.0)
MCH: 26.2 pg (ref 26.0–34.0)
MCHC: 31.1 g/dL (ref 30.0–36.0)
MCV: 84.4 fL (ref 80.0–100.0)
Monocytes Absolute: 0.4 10*3/uL (ref 0.1–1.0)
Monocytes Relative: 8 %
Neutro Abs: 2.8 10*3/uL (ref 1.7–7.7)
Neutrophils Relative %: 62 %
Platelets: 247 10*3/uL (ref 150–400)
RBC: 4.88 MIL/uL (ref 3.87–5.11)
RDW: 14.4 % (ref 11.5–15.5)
WBC: 4.6 10*3/uL (ref 4.0–10.5)
nRBC: 0 % (ref 0.0–0.2)

## 2020-08-12 LAB — BASIC METABOLIC PANEL
Anion gap: 9 (ref 5–15)
BUN: 14 mg/dL (ref 6–20)
CO2: 23 mmol/L (ref 22–32)
Calcium: 8.3 mg/dL — ABNORMAL LOW (ref 8.9–10.3)
Chloride: 102 mmol/L (ref 98–111)
Creatinine, Ser: 0.99 mg/dL (ref 0.44–1.00)
GFR, Estimated: >60 mL/min (ref >60)
Glucose, Bld: 91 mg/dL (ref 70–99)
Potassium: 3.6 mmol/L (ref 3.5–5.1)
Sodium: 134 mmol/L — ABNORMAL LOW (ref 135–145)

## 2020-08-12 LAB — PREGNANCY, URINE: Preg Test, Ur: NEGATIVE

## 2020-08-12 LAB — URINALYSIS, ROUTINE W REFLEX MICROSCOPIC
Bilirubin Urine: NEGATIVE
Glucose, UA: NEGATIVE mg/dL
Hgb urine dipstick: NEGATIVE
Ketones, ur: 20 mg/dL — AB
Nitrite: NEGATIVE
Protein, ur: 30 mg/dL — AB
Specific Gravity, Urine: 1.033 — ABNORMAL HIGH (ref 1.005–1.030)
pH: 5 (ref 5.0–8.0)

## 2020-08-12 LAB — LACTIC ACID, PLASMA: Lactic Acid, Venous: 0.6 mmol/L (ref 0.5–1.9)

## 2020-08-12 MED ORDER — LACTATED RINGERS IV BOLUS
2000.0000 mL | Freq: Once | INTRAVENOUS | Status: AC
  Administered 2020-08-12: 01:00:00 2000 mL via INTRAVENOUS

## 2020-08-13 LAB — URINE CULTURE

## 2020-08-19 ENCOUNTER — Encounter: Payer: Self-pay | Admitting: Family Medicine

## 2020-08-28 ENCOUNTER — Other Ambulatory Visit: Payer: Self-pay

## 2020-08-28 ENCOUNTER — Emergency Department (HOSPITAL_COMMUNITY)
Admission: EM | Admit: 2020-08-28 | Discharge: 2020-08-28 | Disposition: A | Discharge status: Home / Self Care | Payer: 59 | Attending: Emergency Medicine | Admitting: Emergency Medicine

## 2020-08-28 ENCOUNTER — Encounter (HOSPITAL_COMMUNITY): Payer: Self-pay | Admitting: Emergency Medicine

## 2020-08-28 DIAGNOSIS — U071 COVID-19: Secondary | ICD-10-CM | POA: Insufficient documentation

## 2020-08-28 DIAGNOSIS — R509 Fever, unspecified: Secondary | ICD-10-CM | POA: Diagnosis present

## 2020-08-28 DIAGNOSIS — Z20822 Contact with and (suspected) exposure to covid-19: Secondary | ICD-10-CM

## 2020-08-28 MED ORDER — ONDANSETRON 4 MG PO TBDP
4.0000 mg | ORAL_TABLET | Freq: Once | ORAL | Status: AC
  Administered 2020-08-28: 4 mg via ORAL
  Filled 2020-08-28: qty 1

## 2020-08-28 MED ORDER — IBUPROFEN 600 MG PO TABS: 600.0000 mg | ORAL_TABLET | Freq: Four times a day (QID) | ORAL | 0 refills | Status: DC | PRN

## 2020-08-28 MED ORDER — ACETAMINOPHEN 500 MG PO TABS
1000.0000 mg | ORAL_TABLET | Freq: Once | ORAL | Status: AC
  Administered 2020-08-28: 1000 mg via ORAL
  Filled 2020-08-28: qty 2

## 2020-08-28 MED ORDER — ONDANSETRON 4 MG PO TBDP: 4.0000 mg | ORAL_TABLET | Freq: Three times a day (TID) | ORAL | 0 refills | Status: DC | PRN

## 2020-08-28 NOTE — Discharge Instructions (Signed)
You were seen in the emergency department today with fever and COVID-19-like symptoms. I am sending a COVID test which will come back in the next 6 to 24 hours. I am taking out of work for the next 5 days per [employer] guidelines. If you are feeling better after 5 days with no fever and improving symptoms you can return to work with mask wearing. Discussed with your employer regarding any particular return guidelines they have. You may take Tylenol and or Motrin as needed for symptoms. Have also called in some nausea medication to your pharmacy. Please drink plenty of fluids and return to the emergency department with any new or suddenly worsening symptoms.

## 2020-08-28 NOTE — ED Provider Notes (Signed)
Emergency Department Provider Note   I have reviewed the triage vital signs and the nursing notes.   HISTORY  Chief Complaint COVID EXPOSURE WITH SYMPTOMS   HPI Joan Andrews is a 25 y.o. female presents to the emergency department for evaluation of body aches with fever, chills, cough, nausea along with headache. 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 She denies any chest pain, shortness of breath, dysuria, hesitancy, urgency. She arrives with fever. Symptoms began today. She was tested for COVID 2 weeks ago along with flu and both were negative at that time.    Past Medical History:  Diagnosis Date  . Bronchitis    as a child only  . Contraceptive management 12/26/2013  . Recurrent tonsillitis   . Seizures ([place])    Questionable x1    Patient Active Problem List   Diagnosis Date Noted  . S/P bilateral breast reduction 12/09/2019  . Obesity (BMI 30-39.9) 08/27/2019  . Encounter for IUD insertion 03/07/2018  . Seizure-like activity ([place]) 10/03/2017  . Uterine fibroid 07/24/2017  . History of chlamydia 02/16/2016    Past Surgical History:  Procedure Laterality Date  . BREAST REDUCTION SURGERY Bilateral 11/04/2019   Procedure: BILATERAL MAMMARY REDUCTION  (BREAST);  Surgeon: [name], MD;  Location: [facility];  Service: Plastics;  Laterality: Bilateral;  . HERNIA REPAIR      Allergies Patient has no known allergies.  Family History  Problem Relation Age of Onset  . Hypertension Father   . Alzheimer's disease Maternal Grandmother   . Hypertension Mother   . Diabetes Paternal Uncle   . Diabetes Cousin     Social History Social History   Tobacco Use  . Smoking status: Never Smoker  . Smokeless tobacco: Never Used  Vaping Use  . Vaping Use: Never used  Substance Use Topics  . Alcohol use: No  . Drug use: No    Review of Systems  Constitutional: Positive fever/chills and body aches.  Eyes: No  visual changes. ENT: No sore throat. Cardiovascular: Denies chest pain. Respiratory: Denies shortness of breath. Positive cough.  Gastrointestinal: Denies abdominal pain. Positive nausea, no vomiting.  No diarrhea.  No constipation. Genitourinary: Negative for dysuria. Musculoskeletal: Negative for back pain. Positive body aches.  Skin: Negative for rash. Neurological: Negative for focal weakness or numbness. Positive HA.   10-point ROS otherwise negative.  ____________________________________________   PHYSICAL EXAM:  VITAL SIGNS: ED Triage Vitals  Enc Vitals Group     BP 08/28/20 2151 118/78     Pulse Rate 08/28/20 2151 (!) 118     Resp 08/28/20 2151 16     Temp 08/28/20 2151 (!) 101.5 F (38.6 C)     Temp Source 08/28/20 2151 Oral     SpO2 08/28/20 2151 98 %     Weight 08/28/20 2152 210 lb (95.3 kg)     Height 08/28/20 2152 5\' 6"  (1.676 m)   Constitutional: Alert and oriented. Well appearing and in no acute distress. Eyes: Conjunctivae are normal.  Head: Atraumatic. Nose: No congestion/rhinnorhea. Mouth/Throat: Mucous membranes are moist.  Neck: No stridor.   Cardiovascular: Normal rate, regular rhythm. Good peripheral circulation. Grossly normal heart sounds.   Respiratory: Normal respiratory effort.  No retractions. Lungs CTAB. Gastrointestinal: Soft and nontender. No distention.  Musculoskeletal: No gross deformities of extremities. Neurologic:  Normal speech and language. Skin:  Skin is warm, dry and intact. No rash noted.  ____________________________________________   LABS (all  labs ordered are listed, but only abnormal results are displayed)  Labs Reviewed  SARS CORONAVIRUS 2 (TAT 6-24 HRS)   ____________________________________________   PROCEDURES  Procedure(s) performed:   Procedures  None  ____________________________________________   INITIAL IMPRESSION / ASSESSMENT AND PLAN / ED COURSE  Pertinent labs & imaging results that were  available during my care of the patient were reviewed by me and considered in my medical decision making (see chart for details).   Patient presents to the emergency department with COVID-19-like symptoms. She arrives with fever and mild tachycardia likely related to the fever. No chest pain or shortness of breath to suspect PE or other acute process in the chest. Abdomen is diffusely soft and nontender. Triage note describes abdominal pain but patient denies this to me. She has nontender abdomen in all quadrants. No CVA tenderness. No UTI symptoms. Do not plan on additional testing other than COVID PCR. Patient to follow the test results in the MyChart app and remain in quarantine. We will take her out of work for 5 days per [employer] guidelines.   Joan Andrews was evaluated in Emergency Department on 08/28/2020 for the symptoms described in the history of present illness. She was evaluated in the context of the global COVID-19 pandemic, which necessitated consideration that the patient might be at risk for infection with the SARS-CoV-2 virus that causes COVID-19. Institutional protocols and algorithms that pertain to the evaluation of patients at risk for COVID-19 are in a state of rapid change based on information released by regulatory bodies including the CDC and federal and state organizations. These policies and algorithms were followed during the patient's care in the ED.  ____________________________________________  FINAL CLINICAL IMPRESSION(S) / ED DIAGNOSES  Final diagnoses:  Suspected COVID-19 virus infection     MEDICATIONS GIVEN DURING THIS VISIT:  Medications  acetaminophen (TYLENOL) tablet 1,000 mg (1,000 mg Oral Given 08/28/20 2238)  ondansetron (ZOFRAN-ODT) disintegrating tablet 4 mg (4 mg Oral Given 08/28/20 2238)     NEW OUTPATIENT MEDICATIONS STARTED DURING THIS VISIT:  New Prescriptions   IBUPROFEN (ADVIL) 600 MG TABLET    Take 1 tablet (600 mg total) by mouth every 6  (six) hours as needed for moderate pain.   ONDANSETRON (ZOFRAN ODT) 4 MG DISINTEGRATING TABLET    Take 1 tablet (4 mg total) by mouth every 8 (eight) hours as needed.    Note:  This document was prepared using Dragon voice recognition software and may include unintentional dictation errors.  Nanda Quinton, MD, [facility]    Laban Orourke, [facility], MD 08/28/20 2240

## 2020-08-28 NOTE — ED Triage Notes (Signed)
Pt to the ED with c/o a known covid exposure, body aches, fever, chills, cough, abdominal pain, nausea, itchy throat, and headache.

## 2020-08-29 LAB — SARS CORONAVIRUS 2 (TAT 6-24 HRS): SARS Coronavirus 2: POSITIVE — AB

## 2020-10-21 ENCOUNTER — Other Ambulatory Visit: Payer: Self-pay

## 2020-10-21 ENCOUNTER — Ambulatory Visit (INDEPENDENT_AMBULATORY_CARE_PROVIDER_SITE_OTHER): Payer: 59 | Admitting: Nurse Practitioner

## 2020-10-21 ENCOUNTER — Encounter: Payer: Self-pay | Admitting: Nurse Practitioner

## 2020-10-21 VITALS — BP 116/76 | HR 96 | Temp 97.2°F | Ht 66.0 in | Wt 207.0 lb

## 2020-10-21 DIAGNOSIS — M545 Low back pain, unspecified: Secondary | ICD-10-CM | POA: Diagnosis not present

## 2020-10-21 MED ORDER — CYCLOBENZAPRINE HCL 5 MG PO TABS: 5.0000 mg | ORAL_TABLET | Freq: Three times a day (TID) | ORAL | 1 refills | Status: DC | PRN

## 2020-10-21 NOTE — Patient Instructions (Addendum)
F/u as needed  Back Exercises These exercises help to make your trunk and back strong. They also help to keep the lower back flexible. Doing these exercises can help to prevent back pain or lessen existing pain.  If you have back pain, try to do these exercises 2-3 times each day or as told by your doctor.  As you get better, do the exercises once each day. Repeat the exercises more often as told by your doctor.  To stop back pain from coming back, do the exercises once each day, or as told by your doctor. Exercises Single knee to chest Do these steps 3-5 times in a row for each leg: 1. Lie on your back on a firm bed or the floor with your legs stretched out. 2. Bring one knee to your chest. 3. Grab your knee or thigh with both hands and hold them it in place. 4. Pull on your knee until you feel a gentle stretch in your lower back or buttocks. 5. Keep doing the stretch for 10-30 seconds. 6. Slowly let go of your leg and straighten it. Pelvic tilt Do these steps 5-10 times in a row: 1. Lie on your back on a firm bed or the floor with your legs stretched out. 2. Bend your knees so they point up to the ceiling. Your feet should be flat on the floor. 3. Tighten your lower belly (abdomen) muscles to press your lower back against the floor. This will make your tailbone point up to the ceiling instead of pointing down to your feet or the floor. 4. Stay in this position for 5-10 seconds while you gently tighten your muscles and breathe evenly. Cat-cow Do these steps until your lower back bends more easily: 1. Get on your hands and knees on a firm surface. Keep your hands under your shoulders, and keep your knees under your hips. You may put padding under your knees. 2. Let your head hang down toward your chest. Tighten (contract) the muscles in your belly. Point your tailbone toward the floor so your lower back becomes rounded like the back of a cat. 3. Stay in this position for 5  seconds. 4. Slowly lift your head. Let the muscles of your belly relax. Point your tailbone up toward the ceiling so your back forms a sagging arch like the back of a cow. 5. Stay in this position for 5 seconds.   Press-ups Do these steps 5-10 times in a row: 1. Lie on your belly (face-down) on the floor. 2. Place your hands near your head, about shoulder-width apart. 3. While you keep your back relaxed and keep your hips on the floor, slowly straighten your arms to raise the top half of your body and lift your shoulders. Do not use your back muscles. You may change where you place your hands in order to make yourself more comfortable. 4. Stay in this position for 5 seconds. 5. Slowly return to lying flat on the floor.   Bridges Do these steps 10 times in a row: 1. Lie on your back on a firm surface. 2. Bend your knees so they point up to the ceiling. Your feet should be flat on the floor. Your arms should be flat at your sides, next to your body. 3. Tighten your butt muscles and lift your butt off the floor until your waist is almost as high as your knees. If you do not feel the muscles working in your butt and the back of your  thighs, slide your feet 1-2 inches farther away from your butt. 4. Stay in this position for 3-5 seconds. 5. Slowly lower your butt to the floor, and let your butt muscles relax. If this exercise is too easy, try doing it with your arms crossed over your chest.   Belly crunches Do these steps 5-10 times in a row: 1. Lie on your back on a firm bed or the floor with your legs stretched out. 2. Bend your knees so they point up to the ceiling. Your feet should be flat on the floor. 3. Cross your arms over your chest. 4. Tip your chin a little bit toward your chest but do not bend your neck. 5. Tighten your belly muscles and slowly raise your chest just enough to lift your shoulder blades a tiny bit off of the floor. Avoid raising your body higher than that, because it can  put too much stress on your low back. 6. Slowly lower your chest and your head to the floor. Back lifts Do these steps 5-10 times in a row: 1. Lie on your belly (face-down) with your arms at your sides, and rest your forehead on the floor. 2. Tighten the muscles in your legs and your butt. 3. Slowly lift your chest off of the floor while you keep your hips on the floor. Keep the back of your head in line with the curve in your back. Look at the floor while you do this. 4. Stay in this position for 3-5 seconds. 5. Slowly lower your chest and your face to the floor. Contact a doctor if:  Your back pain gets a lot worse when you do an exercise.  Your back pain does not get better 2 hours after you exercise. If you have any of these problems, stop doing the exercises. Do not do them again unless your doctor says it is okay. Get help right away if:  You have sudden, very bad back pain. If this happens, stop doing the exercises. Do not do them again unless your doctor says it is okay. This information is not intended to replace advice given to you by your health care provider. Make sure you discuss any questions you have with your health care provider. Document Revised: 04/25/2018 Document Reviewed: 04/25/2018 Elsevier Patient Education  2021 [employer].

## 2020-10-21 NOTE — Progress Notes (Signed)
Subjective:    Patient ID: Joan Andrews, female    DOB: 10-22-1995, 25 y.o.   MRN: [id number]  HPI: FREDERICK KLINGER is a 25 y.o. female presenting for  Chief Complaint  Patient presents with  . 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 cyclobenzaprine (FLEXERIL) 5 MG tablet Take 1 tablet (5 mg total) by mouth 3 (three) times daily as needed for muscle spasms.  Marland Kitchen ibuprofen (ADVIL) 600 MG tablet Take 1 tablet (600 mg total) by mouth every 6 (six) hours as needed for moderate pain.  Marland Kitchen levonorgestrel (MIRENA) 20 MCG/24HR IUD 1 each by Intrauterine route once.  . [DISCONTINUED] ondansetron (ZOFRAN ODT) 4 MG disintegrating tablet Take 1 tablet (4 mg total) by mouth every 8 (eight) hours as needed.   No facility-administered encounter medications on file as of 10/21/2020.    Patient Active Problem List   Diagnosis Date Noted  . S/P bilateral breast reduction 12/09/2019  . Obesity (BMI 30-39.9) 08/27/2019  . Encounter for IUD insertion 03/07/2018  . Seizure-like activity ([place]) 10/03/2017  . Uterine fibroid 07/24/2017  . History of chlamydia 02/16/2016    Past Medical History:  Diagnosis Date  . Bronchitis    as a child only  . Contraceptive management  12/26/2013  . Recurrent tonsillitis   . Seizures ([place])    Questionable x1    Relevant past medical, surgical, family and social history reviewed and updated as indicated. Interim medical history since our last visit reviewed.  Review of Systems Per HPI unless specificahsdlly indicated above     Objective:    BP 116/76   Pulse 96   Temp (!) 97.2 F (36.2 C)   Ht 5\' 6"  (1.676 m)   Wt 207 lb (93.9 kg)   SpO2 99%   BMI 33.41 kg/m   Wt Readings from Last 3 Encounters:  10/21/20 207 lb (93.9 kg)  08/28/20 210 lb (95.3 kg)  08/11/20 210 lb (95.3 kg)    Physical Exam Vitals and nursing note reviewed.  Constitutional:      General: She is not in acute distress.    Appearance: Normal appearance. She is not toxic-appearing.  Musculoskeletal:        General: Tenderness (to back) present. No swelling. Normal range of motion.  Skin:    General: Skin is warm and dry.     Capillary Refill: Capillary refill takes less than 2 seconds.     Coloration: Skin is not jaundiced or pale.     Findings: No erythema.  Neurological:     Mental Status: She is alert and oriented to person, place, and time.     Motor: No weakness.     Gait: Gait normal.  Psychiatric:        Mood and Affect: Mood normal.  Behavior: Behavior normal.        Thought Content: Thought content normal.        Judgment: Judgment normal.    Results for orders placed or performed during the hospital encounter of 08/28/20  SARS CORONAVIRUS 2 (TAT 6-24 HRS) Nasopharyngeal Nasopharyngeal Swab   Specimen: Nasopharyngeal Swab  Result Value Ref Range   SARS Coronavirus 2 POSITIVE (A) NEGATIVE      Assessment & Plan:   1. Acute bilateral low back pain without sciatica Acute.  No red flags in history or on examination today.  No radicular pain.  Treat with muscle relaxant, stretches, alternating Tylenol/ibuprofen, and ice/heat.  Okay to try massage or chiropractor.  Discussed that many times, acute back pain is self  limiting and will resolve over ~6 weeks.  Note for work given for 5 days.  With no improvement in 1 week, return to clinic.  Consider imaging/referral to PT with no improvement.   - cyclobenzaprine (FLEXERIL) 5 MG tablet; Take 1 tablet (5 mg total) by mouth 3 (three) times daily as needed for muscle spasms.  Dispense: 30 tablet; Refill: 1   Follow up plan: Return if symptoms worsen or fail to improve.

## 2020-10-22 ENCOUNTER — Encounter: Payer: 59 | Admitting: Nurse Practitioner

## 2020-10-22 NOTE — Progress Notes (Deleted)
There were no vitals taken for this visit.   Subjective:    Patient ID: Joan Andrews, female    DOB: 1996/03/22, 25 y.o.   MRN: [id number]  HPI: Joan Andrews is a 25 y.o. female presenting on 10/22/2020 for comprehensive medical examination. Current medical complaints include:{Blank single:19197::"none","***"}  She currently lives with: LMP: {Blank single:19197::"yes","no"}  Depression Screen done today and results listed below:  Depression screen [facility] 2/9 09/19/2019 06/26/2017 05/15/2017 02/08/2017  Decreased Interest 1 0 0 0  Down, Depressed, Hopeless 0 0 0 0  PHQ - 2 Score 1 0 0 0  Altered sleeping 0 3 - -  Tired, decreased energy 1 0 - -  Change in appetite 0 0 - -  Feeling bad or failure about yourself  0 0 - -  Trouble concentrating 0 0 - -  Moving slowly or fidgety/restless 0 0 - -  Suicidal thoughts 0 0 - -  PHQ-9 Score 2 3 - -  Difficult doing work/chores Not difficult at all Not difficult at all - -    The patient {has/does not YWVP:71062} a history of falls. I {did/did not:19850} complete a risk assessment for falls. A plan of care for falls {was/was not:19852} documented.   Past Medical History:  Past Medical History:  Diagnosis Date  . Bronchitis    as a child only  . Contraceptive management 12/26/2013  . Recurrent tonsillitis   . Seizures ([place])    Questionable x1    Surgical History:  Past Surgical History:  Procedure Laterality Date  . BREAST REDUCTION SURGERY Bilateral 11/04/2019   Procedure: BILATERAL MAMMARY REDUCTION  (BREAST);  Surgeon: [name], MD;  Location: [place];  Service: Plastics;  Laterality: Bilateral;  . HERNIA REPAIR      Medications:  Current Outpatient Medications on File Prior to Visit  Medication Sig  . cyclobenzaprine (FLEXERIL) 5 MG tablet Take 1 tablet (5 mg total) by mouth 3 (three) times daily as needed for muscle spasms.  Marland Kitchen ibuprofen (ADVIL) 600 MG tablet Take 1 tablet (600 mg  total) by mouth every 6 (six) hours as needed for moderate pain.  Marland Kitchen levonorgestrel (MIRENA) 20 MCG/24HR IUD 1 each by Intrauterine route once.   No current facility-administered medications on file prior to visit.    Allergies:  No Known Allergies  Social History:  Social History   Socioeconomic History  . Marital status: Single    Spouse name: Not on file  . Number of children: Not on file  . Years of education: Not on file  . Highest education level: Not on file  Occupational History  . Not on file  Tobacco Use  . Smoking status: Never Smoker  . Smokeless tobacco: Never Used  Vaping Use  . Vaping Use: Never used  Substance and Sexual Activity  . Alcohol use: No  . Drug use: No  . Sexual activity: Yes    Partners: Male    Birth control/protection: I.U.D.  Other Topics Concern  . Not on file  Social History Narrative  . Not on file   Social Determinants of Health   Financial Resource Strain: Not on file  Food Insecurity: Not on file  Transportation Needs: Not on file  Physical Activity: Not on file  Stress: Not on file  Social Connections: Not on file  Intimate Partner Violence: Not on file   Social History   Tobacco Use  Smoking Status Never Smoker  Smokeless Tobacco Never Used  Social History   Substance and Sexual Activity  Alcohol Use No    Family History:  Family History  Problem Relation Age of Onset  . Hypertension Father   . Alzheimer's disease Maternal Grandmother   . Hypertension Mother   . Diabetes Paternal Uncle   . Diabetes Cousin     Past medical history, surgical history, medications, allergies, family history and social history reviewed with patient today and changes made to appropriate areas of the chart.   ROS     Objective:    There were no vitals taken for this visit.  Wt Readings from Last 3 Encounters:  10/21/20 207 lb (93.9 kg)  08/28/20 210 lb (95.3 kg)  08/11/20 210 lb (95.3 kg)    Physical Exam  Results for  orders placed or performed during the hospital encounter of 08/28/20  SARS CORONAVIRUS 2 (TAT 6-24 HRS) Nasopharyngeal Nasopharyngeal Swab   Specimen: Nasopharyngeal Swab  Result Value Ref Range   SARS Coronavirus 2 POSITIVE (A) NEGATIVE      Assessment & Plan:   Problem List Items Addressed This Visit   None      Follow up plan: No follow-ups on file.   LABORATORY TESTING:  - Pap smear: {Blank VEHMCN:47096::"GEZ done","not applicable","up to date","done elsewhere"}  IMMUNIZATIONS:   - Tdap: Tetanus vaccination status reviewed: {tetanus status:315746}. - Influenza: {Blank single:19197::"Up to date","Administered today","Postponed to flu season","Refused","Given elsewhere"} - Pneumovax: {Blank single:19197::"Up to date","Administered today","Not applicable","Refused","Given elsewhere"} - Prevnar: {Blank single:19197::"Up to date","Administered today","Not applicable","Refused","Given elsewhere"} - HPV: {Blank single:19197::"Up to date","Administered today","Not applicable","Refused","Given elsewhere"} - Zostavax vaccine: {Blank single:19197::"Up to date","Administered today","Not applicable","Refused","Given elsewhere"} - COVID-19 vaccine:   SCREENING: -Mammogram: {Blank single:19197::"Up to date","Ordered today","Not applicable","Refused","Done elsewhere"}  - Colonoscopy: {Blank single:19197::"Up to date","Ordered today","Not applicable","Refused","Done elsewhere"}  - Bone Density: {Blank single:19197::"Up to date","Ordered today","Not applicable","Refused","Done elsewhere"}  -Hearing Test: {Blank single:19197::"Up to date","Ordered today","Not applicable","Refused","Done elsewhere"}  -Spirometry: {Blank single:19197::"Up to date","Ordered today","Not applicable","Refused","Done elsewhere"}   PATIENT COUNSELING:   Advised to take 1 mg of folate supplement per day if capable of pregnancy.   Sexuality: Discussed sexually transmitted diseases, partner selection, use of condoms,  avoidance of unintended pregnancy  and contraceptive alternatives.   Advised to avoid cigarette smoking.  I discussed with the patient that most people either abstain from alcohol or drink within safe limits (<=14/week and <=4 drinks/occasion for males, <=7/weeks and <= 3 drinks/occasion for females) and that the risk for alcohol disorders and other health effects rises proportionally with the number of drinks per week and how often a drinker exceeds daily limits.  Discussed cessation/primary prevention of drug use and availability of treatment for abuse.   Diet: Encouraged to adjust caloric intake to maintain  or achieve ideal body weight, to reduce intake of dietary saturated fat and total fat, to limit sodium intake by avoiding high sodium foods and not adding table salt, and to maintain adequate dietary potassium and calcium preferably from fresh fruits, vegetables, and low-fat dairy products.    stressed the importance of regular exercise  Injury prevention: Discussed safety belts, safety helmets, smoke detector, smoking near bedding or upholstery.   Dental health: Discussed importance of regular tooth brushing, flossing, and dental visits.    NEXT PREVENTATIVE PHYSICAL DUE IN 1 YEAR. No follow-ups on file.

## 2020-11-01 ENCOUNTER — Encounter: Payer: 59 | Admitting: Nurse Practitioner

## 2020-11-10 ENCOUNTER — Encounter: Payer: Self-pay | Admitting: Nurse Practitioner

## 2020-11-10 ENCOUNTER — Other Ambulatory Visit: Payer: Self-pay

## 2020-11-10 ENCOUNTER — Ambulatory Visit (INDEPENDENT_AMBULATORY_CARE_PROVIDER_SITE_OTHER): Payer: 59 | Admitting: Nurse Practitioner

## 2020-11-10 VITALS — BP 104/78 | HR 94 | Temp 97.3°F | Ht 66.0 in | Wt 209.0 lb

## 2020-11-10 DIAGNOSIS — E669 Obesity, unspecified: Secondary | ICD-10-CM | POA: Diagnosis not present

## 2020-11-10 DIAGNOSIS — Z0000 Encounter for general adult medical examination without abnormal findings: Secondary | ICD-10-CM

## 2020-11-10 DIAGNOSIS — Z0001 Encounter for general adult medical examination with abnormal findings: Secondary | ICD-10-CM | POA: Diagnosis not present

## 2020-11-10 LAB — CBC WITH DIFFERENTIAL/PLATELET
Absolute Monocytes: 697 cells/uL (ref 200–950)
Basophils Absolute: 52 cells/uL (ref 0–200)
Basophils Relative: 0.6 %
Eosinophils Absolute: 60 cells/uL (ref 15–500)
Eosinophils Relative: 0.7 %
HCT: 37 % (ref 35.0–45.0)
Hemoglobin: 11.7 g/dL (ref 11.7–15.5)
Lymphs Abs: 2821 cells/uL (ref 850–3900)
MCH: 26.5 pg — ABNORMAL LOW (ref 27.0–33.0)
MCHC: 31.6 g/dL — ABNORMAL LOW (ref 32.0–36.0)
MCV: 83.7 fL (ref 80.0–100.0)
MPV: 9.7 fL (ref 7.5–12.5)
Monocytes Relative: 8.1 %
Neutro Abs: 4971 cells/uL (ref 1500–7800)
Neutrophils Relative %: 57.8 %
Platelets: 282 10*3/uL (ref 140–400)
RBC: 4.42 10*6/uL (ref 3.80–5.10)
RDW: 13 % (ref 11.0–15.0)
Total Lymphocyte: 32.8 %
WBC: 8.6 10*3/uL (ref 3.8–10.8)

## 2020-11-10 LAB — COMPLETE METABOLIC PANEL WITH GFR
AG Ratio: 1.2 (calc) (ref 1.0–2.5)
ALT: 16 U/L (ref 6–29)
AST: 15 U/L (ref 10–30)
Albumin: 3.9 g/dL (ref 3.6–5.1)
Alkaline phosphatase (APISO): 63 U/L (ref 31–125)
BUN: 16 mg/dL (ref 7–25)
CO2: 25 mmol/L (ref 20–32)
Calcium: 9 mg/dL (ref 8.6–10.2)
Chloride: 104 mmol/L (ref 98–110)
Creat: 0.95 mg/dL (ref 0.50–1.10)
GFR, Est African American: 96 mL/min/{1.73_m2} (ref >=60)
GFR, Est Non African American: 83 mL/min/{1.73_m2} (ref >=60)
Globulin: 3.2 g/dL (calc) (ref 1.9–3.7)
Glucose, Bld: 87 mg/dL (ref 65–99)
Potassium: 3.8 mmol/L (ref 3.5–5.3)
Sodium: 138 mmol/L (ref 135–146)
Total Bilirubin: 0.4 mg/dL (ref 0.2–1.2)
Total Protein: 7.1 g/dL (ref 6.1–8.1)

## 2020-11-10 LAB — LIPID PANEL
Cholesterol: 120 mg/dL (ref <200)
HDL: 42 mg/dL — ABNORMAL LOW (ref >=50)
LDL Cholesterol (Calc): 66 mg/dL (calc)
Non-HDL Cholesterol (Calc): 78 mg/dL (calc) (ref <130)
Total CHOL/HDL Ratio: 2.9 (calc) (ref <5.0)
Triglycerides: 43 mg/dL (ref <150)

## 2020-11-10 NOTE — Progress Notes (Signed)
BP 104/78   Pulse 94   Temp (!) 97.3 F (36.3 C)   Ht 5\' 6"  (1.676 m)   Wt 209 lb (94.8 kg)   SpO2 100%   BMI 33.73 kg/m    Subjective:    Patient ID: Joan Andrews Andrews, female    DOB: 1996-02-04, 25 y.o.   MRN: [id number]  HPI: Joan Andrews Andrews is a 25 y.o. female presenting on 11/10/2020 for comprehensive medical examination. Current medical complaints include:  OVERWEIGHT Patient reports she is actively trying to lose weight.  She has been drinking increased amounts of water and is not drinking any soda.  She drinks about 1 gallon of water per states that she works and tries to drink water at home as well.  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  Is going to try to increase physical activity within the next couple weeks.  She currently lives with: fiance, 1 girl 1 boy LMP: irregular with Mirena  Depression Screen done today and results listed below:  Depression screen [facility] 2/9 11/10/2020 09/19/2019 06/26/2017 05/15/2017 02/08/2017  Decreased Interest 0 1 0 0 0  Down, Depressed, Hopeless 0 0 0 0 0  PHQ - 2 Score 0 1 0 0 0  Altered sleeping 0 0 3 - -  Tired, decreased energy 0 1 0 - -  Change in appetite 0 0 0 - -  Feeling bad or failure about yourself  0 0 0 - -  Trouble concentrating 0 0 0 - -  Moving slowly or fidgety/restless 0 0 0 - -  Suicidal thoughts 0 0 0 - -  PHQ-9 Score 0 2 3 - -  Difficult doing work/chores Not difficult at all Not difficult at all Not difficult at all - -   The patient does not have a history of falls. I did not complete a risk assessment for falls. A plan of care for falls was not documented.   Past Medical History:  Past Medical History:  Diagnosis Date  . Bronchitis    as a child only  . Contraceptive management 12/26/2013  . Recurrent tonsillitis   . Seizures ([place])    Questionable x1    Surgical History:  Past Surgical History:  Procedure Laterality Date  . BREAST REDUCTION SURGERY Bilateral  11/04/2019   Procedure: BILATERAL MAMMARY REDUCTION  (BREAST);  Surgeon: [name], MD;  Location: [place];  Service: Plastics;  Laterality: Bilateral;  . HERNIA REPAIR      Medications:  Current Outpatient Medications on File Prior to Visit  Medication Sig  . levonorgestrel (MIRENA) 20 MCG/24HR IUD 1 each by Intrauterine route once.   No current facility-administered medications on file prior to visit.    Allergies:  No Known Allergies  Social History:  Social History   Socioeconomic History  . Marital status: Single    Spouse name: Not on file  . Number of children: Not on file  . Years of education: Not on file  . Highest education level: Not on file  Occupational History  . Not on file  Tobacco Use  . Smoking status: Never Smoker  . Smokeless tobacco: Never Used  Vaping Use  . Vaping Use: Never used  Substance and Sexual Activity  . Alcohol use: No  . Drug use: No  . Sexual activity: Yes    Partners: Male    Birth control/protection: I.U.D.  Other Topics Concern  . Not on file  Social History Narrative  . Not on file   Social Determinants of Health   Financial Resource Strain: Not on file  Food Insecurity: Not on file  Transportation Needs: Not on file  Physical Activity: Not on file  Stress: Not on file  Social Connections: Not on file  Intimate Partner Violence: Not on file   Social History   Tobacco Use  Smoking Status Never Smoker  Smokeless Tobacco Never Used   Social History   Substance and Sexual Activity  Alcohol Use No    Family History:  Family History  Problem Relation Age of Onset  . Hypertension Father   . Alzheimer's disease Maternal Grandmother   . Hypertension Mother   . Diabetes Paternal Uncle   . Diabetes Cousin     Past medical history, surgical history, medications, allergies, family history and social history reviewed with patient today and changes made to appropriate areas of the chart.    Review of Systems  Constitutional: Negative.  Negative for fever and weight loss.  HENT: Negative.  Negative for ear discharge, hearing loss, sinus pain and tinnitus.   Eyes: Negative.  Negative for blurred vision and double vision.  Respiratory: Negative.  Negative for cough, sputum production, shortness of breath and wheezing.   Cardiovascular: Negative.  Negative for chest pain and palpitations.  Gastrointestinal: Negative.  Negative for abdominal pain, blood in stool, diarrhea, heartburn, nausea and vomiting.  Genitourinary: Negative.  Negative for dysuria, frequency and urgency.  Musculoskeletal: Negative.  Negative for back pain and myalgias.  Skin: Negative.  Negative for itching and rash.  Neurological: Negative.  Negative for dizziness, seizures, weakness and headaches.  Psychiatric/Behavioral: Negative.  Negative for depression, hallucinations and substance abuse. The patient is not nervous/anxious.       Objective:    BP 104/78   Pulse 94   Temp (!) 97.3 F (36.3 C)   Ht 5\' 6"  (1.676 m)   Wt 209 lb (94.8 kg)   SpO2 100%   BMI 33.73 kg/m   Wt Readings from Last 3 Encounters:  11/10/20 209 lb (94.8 kg)  10/21/20 207 lb (93.9 kg)  08/28/20 210 lb (95.3 kg)    Physical Exam Vitals and nursing note reviewed.  Constitutional:      General: She is not in acute distress.    Appearance: Normal appearance. She is normal weight. She is not ill-appearing or toxic-appearing.  HENT:     Head: Normocephalic and atraumatic.     Right Ear: Tympanic membrane, ear canal and external ear normal.     Left Ear: Tympanic membrane, ear canal and external ear normal.     Nose: Nose normal. No congestion or rhinorrhea.     Mouth/Throat:     Mouth: Mucous membranes are moist.     Pharynx: Oropharynx is clear. No oropharyngeal exudate.     Tonsils: No tonsillar exudate or tonsillar abscesses. 2+ on the right. 2+ on the left.  Eyes:     General: No scleral icterus.    Extraocular  Movements: Extraocular movements intact.     Pupils: Pupils are equal, round, and reactive to light.  Cardiovascular:     Rate and Rhythm: Normal rate and regular rhythm.     Pulses: Normal pulses.     Heart sounds: Normal heart sounds. No murmur heard.   Pulmonary:     Effort: Pulmonary effort is normal. No respiratory distress.     Breath sounds: No wheezing or rhonchi.  Chest:  Comments: Deferred-to schedule GYN Abdominal:     General: Abdomen is flat. Bowel sounds are normal. There is no distension.     Palpations: Abdomen is soft.     Tenderness: There is no abdominal tenderness.  Genitourinary:    Comments: Deferred-to schedule GYN Musculoskeletal:        General: No swelling or tenderness. Normal range of motion.     Cervical back: Normal range of motion and neck supple. No rigidity or tenderness.     Right lower leg: No edema.     Left lower leg: No edema.  Skin:    General: Skin is warm and dry.     Capillary Refill: Capillary refill takes less than 2 seconds.     Coloration: Skin is not jaundiced or pale.     Findings: No erythema.  Neurological:     General: No focal deficit present.     Mental Status: She is alert and oriented to person, place, and time.     Motor: No weakness.     Gait: Gait normal.  Psychiatric:        Mood and Affect: Mood normal.        Behavior: Behavior normal.        Thought Content: Thought content normal.        Judgment: Judgment normal.       Assessment & Plan:   Problem List Items Addressed This Visit      Other   Obesity (BMI 30-39.9)    Congratulated for not drinking soda and increasing water intake.  Encouraged eating more vegetables and discussed meal prepping at the beginning of the week to prevent eating out.  Also encouraged increasing physical activity with goal of physical activity 30 minutes 5 times weekly.       Other Visit Diagnoses    Annual physical exam    -  Primary   Relevant Orders   COMPLETE  METABOLIC PANEL WITH GFR   Lipid panel   CBC with Differential       Follow up plan: Return in about 1 year (around 11/10/2021) for CPE with fasting labs.   LABORATORY TESTING:  - Pap smear: She is due for this and reports will schedule with GYN.  IMMUNIZATIONS:   - Tdap: Tetanus vaccination status reviewed: last tetanus booster within 10 years. - Influenza: Up to date - Pneumovax: Not applicable - Prevnar: Not applicable - HPV: Not applicable - Zostavax vaccine: Not applicable - SWFUX-32 vaccine: Up-to-date  SCREENING: -Mammogram: Not applicable  - Colonoscopy: Not applicable  - Bone Density: Not applicable  -Hearing Test: Not applicable  -Spirometry: Not applicable   PATIENT COUNSELING:   Advised to take 1 mg of folate supplement per day if capable of pregnancy.   Sexuality: Discussed sexually transmitted diseases, partner selection, use of condoms, avoidance of unintended pregnancy  and contraceptive alternatives.   Advised to avoid cigarette smoking.  I discussed with the patient that most people either abstain from alcohol or drink within safe limits (<=14/week and <=4 drinks/occasion for males, <=7/weeks and <= 3 drinks/occasion for females) and that the risk for alcohol disorders and other health effects rises proportionally with the number of drinks per week and how often a drinker exceeds daily limits.  Discussed cessation/primary prevention of drug use and availability of treatment for abuse.   Diet: Encouraged to adjust caloric intake to maintain  or achieve ideal body weight, to reduce intake of dietary saturated fat and total fat, to limit  sodium intake by avoiding high sodium foods and not adding table salt, and to maintain adequate dietary potassium and calcium preferably from fresh fruits, vegetables, and low-fat dairy products.    stressed the importance of regular exercise  Injury prevention: Discussed safety belts, safety helmets, smoke detector, smoking  near bedding or upholstery.   Dental health: Discussed importance of regular tooth brushing, flossing, and dental visits.    NEXT PREVENTATIVE PHYSICAL DUE IN 1 YEAR. Return in about 1 year (around 11/10/2021) for CPE with fasting labs.

## 2020-11-10 NOTE — Assessment & Plan Note (Signed)
Congratulated for not drinking soda and increasing water intake.  Encouraged eating more vegetables and discussed meal prepping at the beginning of the week to prevent eating out.  Also encouraged increasing physical activity with goal of physical activity 30 minutes 5 times weekly.

## 2020-11-10 NOTE — Patient Instructions (Signed)
Preventive Care 21-27 Years Old, Female Preventive care refers to lifestyle choices and visits with your health care provider that can promote health and wellness. This includes:  A yearly physical exam. This is also called an annual wellness visit.  Regular dental and eye exams.  Immunizations.  Screening for certain conditions.  Healthy lifestyle choices, such as: ? Eating a healthy diet. ? Getting regular exercise. ? Not using drugs or products that contain nicotine and tobacco. ? Limiting alcohol use. What can I expect for my preventive care visit? Physical exam Your health care provider may check your:  Height and weight. These may be used to calculate your BMI (body mass index). BMI is a measurement that tells if you are at a healthy weight.  Heart rate and blood pressure.  Body temperature.  Skin for abnormal spots. Counseling Your health care provider may ask you questions about your:  Past medical problems.  Family's medical history.  Alcohol, tobacco, and drug use.  Emotional well-being.  Home life and relationship well-being.  Sexual activity.  Diet, exercise, and sleep habits.  Work and work environment.  Access to firearms.  Method of birth control.  Menstrual cycle.  Pregnancy history. What immunizations do I need? Vaccines are usually given at various ages, according to a schedule. Your health care provider will recommend vaccines for you based on your age, medical history, and lifestyle or other factors, such as travel or where you work.   What tests do I need? Blood tests  Lipid and cholesterol levels. These may be checked every 5 years starting at age 20.  Hepatitis C test.  Hepatitis B test. Screening  Diabetes screening. This is done by checking your blood sugar (glucose) after you have not eaten for a while (fasting).  STD (sexually transmitted disease) testing, if you are at risk.  BRCA-related cancer screening. This may be  done if you have a family history of breast, ovarian, tubal, or peritoneal cancers.  Pelvic exam and Pap test. This may be done every 3 years starting at age 21. Starting at age 30, this may be done every 5 years if you have a Pap test in combination with an HPV test. Talk with your health care provider about your test results, treatment options, and if necessary, the need for more tests.   Follow these instructions at home: Eating and drinking  Eat a healthy diet that includes fresh fruits and vegetables, whole grains, lean protein, and low-fat dairy products.  Take vitamin and mineral supplements as recommended by your health care provider.  Do not drink alcohol if: ? Your health care provider tells you not to drink. ? You are pregnant, may be pregnant, or are planning to become pregnant.  If you drink alcohol: ? Limit how much you have to 0-1 drink a day. ? Be aware of how much alcohol is in your drink. In the [place] one drink equals one 12 oz bottle of beer (355 mL), one 5 oz glass of wine (148 mL), or one 1 oz glass of hard liquor (44 mL).   Lifestyle  Take daily care of your teeth and gums. Brush your teeth every morning and night with fluoride toothpaste. Floss one time each day.  Stay active. Exercise for at least 30 minutes 5 or more days each week.  Do not use any products that contain nicotine or tobacco, such as cigarettes, e-cigarettes, and chewing tobacco. If you need help quitting, ask your health care provider.  Do not   use drugs.  If you are sexually active, practice safe sex. Use a condom or other form of protection to prevent STIs (sexually transmitted infections).  If you do not wish to become pregnant, use a form of birth control. If you plan to become pregnant, see your health care provider for a prepregnancy visit.  Find healthy ways to cope with stress, such as: ? Meditation, yoga, or listening to music. ? Journaling. ? Talking to a trusted  person. ? Spending time with friends and family. Safety  Always wear your seat belt while driving or riding in a vehicle.  Do not drive: ? If you have been drinking alcohol. Do not ride with someone who has been drinking. ? When you are tired or distracted. ? While texting.  Wear a helmet and other protective equipment during sports activities.  If you have firearms in your house, make sure you follow all gun safety procedures.  Seek help if you have been physically or sexually abused. What's next?  Go to your health care provider once a year for an annual wellness visit.  Ask your health care provider how often you should have your eyes and teeth checked.  Stay up to date on all vaccines. This information is not intended to replace advice given to you by your health care provider. Make sure you discuss any questions you have with your health care provider. Document Revised: 03/28/2020 Document Reviewed: 04/11/2018 Elsevier Patient Education  2021 Elsevier Inc.  

## 2020-11-11 ENCOUNTER — Encounter: Payer: Self-pay | Admitting: Nurse Practitioner

## 2020-11-15 ENCOUNTER — Encounter: Payer: Self-pay | Admitting: Emergency Medicine

## 2020-11-15 ENCOUNTER — Ambulatory Visit
Admission: EM | Admit: 2020-11-15 | Discharge: 2020-11-15 | Disposition: A | Discharge status: Home / Self Care | Payer: 59 | Attending: Family Medicine | Admitting: Family Medicine

## 2020-11-15 ENCOUNTER — Other Ambulatory Visit: Payer: Self-pay

## 2020-11-15 DIAGNOSIS — L02419 Cutaneous abscess of limb, unspecified: Secondary | ICD-10-CM | POA: Diagnosis not present

## 2020-11-15 DIAGNOSIS — L723 Sebaceous cyst: Secondary | ICD-10-CM

## 2020-11-15 MED ORDER — DOXYCYCLINE HYCLATE 100 MG PO CAPS: 100.0000 mg | ORAL_CAPSULE | Freq: Two times a day (BID) | ORAL | 0 refills | Status: AC

## 2020-11-15 NOTE — ED Provider Notes (Signed)
[facility]    CSN: [id number] Arrival date & time: 11/15/20  0277      History   Chief Complaint No chief complaint on file.   HPI Joan Andrews is a 25 y.o. female.   HPI  Patient presents today for evaluation of pain abscess involving of her left axillary region.  She reports that it has been present for 3 days or so.  No history of hydradenitis or recurrent abscess or cyst.  Patient also has a tender area in the mid axillary region however neither has drained.  She denies any knowledge of fever.  No nausea vomiting or loose stools.  No recent changes in hair removal techniques.  Past Medical History:  Diagnosis Date  . Bronchitis    as a child only  . Contraceptive management 12/26/2013  . Recurrent tonsillitis   . Seizures ([place])    Questionable x1    Patient Active Problem List   Diagnosis Date Noted  . S/P bilateral breast reduction 12/09/2019  . Obesity (BMI 30-39.9) 08/27/2019  . Encounter for IUD insertion 03/07/2018  . Seizure-like activity ([place]) 10/03/2017  . Uterine fibroid 07/24/2017  . History of chlamydia 02/16/2016    Past Surgical History:  Procedure Laterality Date  . BREAST REDUCTION SURGERY Bilateral 11/04/2019   Procedure: BILATERAL MAMMARY REDUCTION  (BREAST);  Surgeon: [name], MD;  Location: [place];  Service: Plastics;  Laterality: Bilateral;  . HERNIA REPAIR      OB History    Gravida  3   Para  2   Term  2   Preterm      AB  1   Living  2     SAB  1   IAB      Ectopic      Multiple  0   Live Births  2            Home Medications    Prior to Admission medications   Medication Sig Start Date End Date Taking? Authorizing Provider  doxycycline (VIBRAMYCIN) 100 MG capsule Take 1 capsule (100 mg total) by mouth 2 (two) times daily for 7 days. 11/15/20 11/22/20 Yes Scot Jun, FNP  levonorgestrel (MIRENA) 20 MCG/24HR IUD 1 each by Intrauterine route once.    [provider]    Family History Family History  Problem Relation Age of Onset  . Hypertension Father   . Alzheimer's disease Maternal Grandmother   . Hypertension Mother   . Diabetes Paternal Uncle   . Diabetes Cousin     Social History Social History   Tobacco Use  . Smoking status: Never Smoker  . Smokeless tobacco: Never Used  Vaping Use  . Vaping Use: Never used  Substance Use Topics  . Alcohol use: No  . Drug use: No     Allergies   Patient has no known allergies.   Review of Systems Review of Systems Pertinent negatives listed in HPI   Physical Exam Triage Vital Signs ED Triage Vitals  Enc Vitals Group     BP 11/15/20 0820 105/72     Pulse Rate 11/15/20 0820 85     Resp 11/15/20 0820 18     Temp 11/15/20 0820 98.3 F (36.8 C)     Temp Source 11/15/20 0820 Oral     SpO2 11/15/20 0820 98 %     Weight --      Height --      Head Circumference --  Peak Flow --      Pain Score 11/15/20 0819 10     Pain Loc --      Pain Edu? --      Excl. in [place]? --    No data found.  Updated Vital Signs BP 105/72 (BP Location: Right Arm)   Pulse 85   Temp 98.3 F (36.8 C) (Oral)   Resp 18   SpO2 98%   Visual Acuity Right Eye Distance:   Left Eye Distance:   Bilateral Distance:    Right Eye Near:   Left Eye Near:    Bilateral Near:     Physical Exam General appearance: alert, well developed, well nourished, cooperative  Head: Normocephalic, without obvious abnormality, atraumatic Respiratory: Respirations even and unlabored, normal respiratory rate Heart: rate and rhythm normal. No gallop or murmurs noted on exam  Abdomen: BS +, no distention, no rebound tenderness, or no mass Extremities: Left upper axillary region approximately a 12 mm indurated mass with fluctuance present, mid left axillary hardening 5 mm cystlike mass present without fluctuance. Skin: Skin color, texture, turgor normal. No rashes seen  Psych: Appropriate mood and  affect. Neurologic: Mental status: Alert, oriented to person, place, and time, thought content appropriate.   UC Treatments / Results  Labs (all labs ordered are listed, but only abnormal results are displayed) Labs Reviewed - No data to display  EKG   Radiology No results found.  Procedures Incision and Drainage  Date/Time: 11/15/2020 9:22 AM Performed by: Scot Jun, FNP Authorized by: Scot Jun, FNP   Consent:    Consent obtained:  Verbal   Consent given by:  Patient   Risks discussed:  Bleeding and infection Universal protocol:    Patient identity confirmed:  Verbally with patient Location:    Type:  Abscess   Location:  Upper extremity   Upper extremity location:  Arm   Arm location:  L upper arm Pre-procedure details:    Skin preparation:  Antiseptic wash and povidone-iodine Sedation:    Sedation type:  None Anesthesia:    Anesthesia method:  Topical application and local infiltration   Topical anesthetic:  LET   Local anesthetic:  Lidocaine 2% WITH epi Procedure type:    Complexity:  Simple Procedure details:    Incision types:  Single straight   Drainage:  Purulent   Drainage amount:  Copious   Wound treatment:  Wound left open Post-procedure details:    Procedure completion:  Tolerated Comments:     Applied a nonadherent and 4x4 gauze pressure dressing     (including critical care time)  Medications Ordered in UC Medications - No data to display  Initial Impression / Assessment and Plan / UC Course  I have reviewed the triage vital signs and the nursing notes.  Pertinent labs & imaging results that were available during my care of the patient were reviewed by me and considered in my medical decision making (see chart for details).   I&D successfully performed left axillary abscess involving upper region of the left extremity.  Mid sebaceous cyst nonfluctuant mildly tender, I&D not indicated.  Trialing a short course of doxycycline  to see if inflammation and infection resolves.  Return precautions given.  Wound care instructions given.  Patient verbalized understanding and agreement with today's plan. Final Clinical Impressions(s) / UC Diagnoses   Final diagnoses:  Axillary abscess  Sebaceous cyst of left axilla     Discharge Instructions     Return if cyst  mid underarm that was not drained today increases in size or becomes painful. Complete entire course of antibiotics. Change dressing until opened wound is no longer draining.     ED Prescriptions    Medication Sig Dispense Auth. Provider   doxycycline (VIBRAMYCIN) 100 MG capsule Take 1 capsule (100 mg total) by mouth 2 (two) times daily for 7 days. 14 capsule Scot Jun, FNP     PDMP not reviewed this encounter.   Scot Jun, [place] 11/15/20 [phone]

## 2020-11-15 NOTE — Discharge Instructions (Addendum)
Return if cyst mid underarm that was not drained today increases in size or becomes painful. Complete entire course of antibiotics. Change dressing until opened wound is no longer draining.

## 2020-11-15 NOTE — ED Triage Notes (Signed)
abscess under right arm x 3 days.

## 2021-02-06 ENCOUNTER — Encounter: Payer: Self-pay | Admitting: Nurse Practitioner

## 2021-02-10 ENCOUNTER — Other Ambulatory Visit: Payer: Self-pay

## 2021-02-10 ENCOUNTER — Telehealth: Payer: Self-pay

## 2021-02-10 NOTE — Telephone Encounter (Signed)
Looks like she may need varicella and rubella titers, needs PPD, possibly TD booster.  Please contact and have her come in for this.  She will need to go elsewhere for PPD, correct?

## 2021-02-10 NOTE — Telephone Encounter (Signed)
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 Copy of forms are located in green folder when needed in NP's asst's office

## 2021-02-15 ENCOUNTER — Telehealth: Payer: Self-pay

## 2021-02-15 NOTE — Telephone Encounter (Signed)
Pt has submitter the titer report for tb blood test, form is in office folder

## 2021-02-18 NOTE — Telephone Encounter (Signed)
Varicella titer is low, needs second dose of Varicella.  Do not see quantitative result for rubella - she will need to have this drawn.  Needs Tdap/TD booster and PPD skin test.  Laurance Flatten is more than 25 year old.

## 2021-02-22 NOTE — Telephone Encounter (Signed)
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 Waiting for pt to return call

## 2021-04-01 ENCOUNTER — Encounter: Payer: Self-pay | Admitting: Nurse Practitioner

## 2021-04-01 ENCOUNTER — Other Ambulatory Visit: Payer: Self-pay

## 2021-04-01 ENCOUNTER — Ambulatory Visit (INDEPENDENT_AMBULATORY_CARE_PROVIDER_SITE_OTHER): Payer: 59 | Admitting: Nurse Practitioner

## 2021-04-01 VITALS — BP 124/62 | HR 100 | Temp 98.9°F | Resp 14 | Ht 66.0 in | Wt 191.0 lb

## 2021-04-01 DIAGNOSIS — N760 Acute vaginitis: Secondary | ICD-10-CM | POA: Diagnosis not present

## 2021-04-01 DIAGNOSIS — R21 Rash and other nonspecific skin eruption: Secondary | ICD-10-CM

## 2021-04-01 DIAGNOSIS — A549 Gonococcal infection, unspecified: Secondary | ICD-10-CM

## 2021-04-01 DIAGNOSIS — N898 Other specified noninflammatory disorders of vagina: Secondary | ICD-10-CM | POA: Diagnosis not present

## 2021-04-01 DIAGNOSIS — Z113 Encounter for screening for infections with a predominantly sexual mode of transmission: Secondary | ICD-10-CM

## 2021-04-01 DIAGNOSIS — F4321 Adjustment disorder with depressed mood: Secondary | ICD-10-CM

## 2021-04-01 DIAGNOSIS — R768 Other specified abnormal immunological findings in serum: Secondary | ICD-10-CM

## 2021-04-01 DIAGNOSIS — A749 Chlamydial infection, unspecified: Secondary | ICD-10-CM

## 2021-04-01 DIAGNOSIS — B9689 Other specified bacterial agents as the cause of diseases classified elsewhere: Secondary | ICD-10-CM

## 2021-04-01 LAB — WET PREP FOR TRICH, YEAST, CLUE

## 2021-04-01 MED ORDER — METRONIDAZOLE 0.75 % VA GEL
1.0000 | Freq: Every day | VAGINAL | 0 refills | Status: DC
Start: 2021-04-01 — End: 2021-05-26

## 2021-04-01 NOTE — Progress Notes (Signed)
Subjective:    Patient ID: Joan Andrews, female    DOB: 10-Dec-1995, 25 y.o.   MRN: [id number]  HPI: Joan Andrews is a 25 y.o. female presenting for STD screening.   Chief Complaint  Patient presents with   Discuss Results    Has results from [facility] still pending- would like to repeat   Rash    Skin irritation to neck- lighter in color   RASH Duration:  months  Location: right neck  Itching: no Burning: no Redness: no Oozing: no Scaling: no Blisters: no Painful: no Fevers: no Change in detergents/soaps/personal care products: no Recent illness: no Recent travel:no History of same: no Context: stable Alleviating factors: nothing Treatments attempted:Cocoa butter Shortness of breath: no  Throat/tongue swelling: no Myalgias/arthralgias: no  STD SCREENING Requesting screening today.  She was informed a couple of months ago that her partner was unfaithful.  She has tested through [facility], has results results with her today.  Gonorrhea and chlamydia are pending and serum positive for HSV-2.   Contraception:  yes; IUD Recent unprotected intercourse: yes History of sexually transmitted diseases: yes; chlamydia in past Previous sexually transmitted disease screening: yes Genital lesions: no Vaginal discharge: yes Dysuria: no Swollen lymph nodes: no Fevers: no Rash: no  She is tearful today in exam room and has sadness in waves since break up with Fiance.  She tells me she feels disgusted when she looks in the mirror and realizes she should not feel this way.  She is not intersted in medication at this time but would like to talk with a thearpist to work through her feelings.  She is not having any thoughts of hurting or killing herself and does have trusted friends and family members she can talk with in the meantime.  No Known Allergies  Outpatient Encounter Medications as of 04/01/2021  Medication Sig   metroNIDAZOLE (METROGEL VAGINAL) 0.75 %  vaginal gel Place 1 Applicatorful vaginally at bedtime.   levonorgestrel (MIRENA) 20 MCG/24HR IUD 1 each by Intrauterine route once.   No facility-administered encounter medications on file as of 04/01/2021.    Patient Active Problem List   Diagnosis Date Noted   S/P bilateral breast reduction 12/09/2019   Obesity (BMI 30-39.9) 08/27/2019   Encounter for IUD insertion 03/07/2018   Seizure-like activity ([place]) 10/03/2017   Uterine fibroid 07/24/2017   History of chlamydia 02/16/2016    Past Medical History:  Diagnosis Date   Bronchitis    as a child only   Contraceptive management 12/26/2013   Recurrent tonsillitis    Seizures ([place])    Questionable x1    Relevant past medical, surgical, family and social history reviewed and updated as indicated. Interim medical history since our last visit reviewed.  Review of Systems Per HPI unless specifically indicated above     Objective:    BP 124/62   Pulse 100   Temp 98.9 F (37.2 C) (Temporal)   Resp 14   Ht '5\' 6"'$  (1.676 m)   Wt 191 lb (86.6 kg)   SpO2 99%   BMI 30.83 kg/m   Wt Readings from Last 3 Encounters:  04/01/21 191 lb (86.6 kg)  11/10/20 209 lb (94.8 kg)  10/21/20 207 lb (93.9 kg)    Physical Exam Vitals and nursing note reviewed. Exam conducted with a chaperone present (CS).  Constitutional:      General: She is not in acute distress.    Appearance: Normal appearance. She is not toxic-appearing.  Genitourinary:    Exam position: Lithotomy position.     Pubic Area: No rash.      Labia:        Right: No rash, tenderness or lesion.        Left: No rash, tenderness or lesion.      Vagina: Vaginal discharge present. No erythema, tenderness or lesions.     Cervix: Normal.     Uterus: Normal.      Adnexa: Right adnexa normal and left adnexa normal.     Comments: Yellow/green discharge present Lymphadenopathy:     Lower Body: No right inguinal adenopathy. No left inguinal adenopathy.  Skin:    General: Skin  is warm and dry.     Capillary Refill: Capillary refill takes less than 2 seconds.     Coloration: Skin is not jaundiced or pale.     Findings: Rash present. No erythema. Rash is macular. Rash is not crusting, papular, scaling, urticarial or vesicular.          Comments: Circular, hypopigmented rash to neck as pictured.  Neurological:     Mental Status: She is alert and oriented to person, place, and time.     Motor: No weakness.     Gait: Gait normal.  Psychiatric:        Attention and Perception: Attention normal.        Speech: Speech normal.        Behavior: Behavior normal.        Thought Content: Thought content normal.        Judgment: Judgment normal.      Assessment & Plan:  1. Routine screening for STI (sexually transmitted infection)  - C. trachomatis/N. gonorrhoeae RNA - WET PREP FOR [place], YEAST, CLUE  2. Vaginal discharge Acute.  Wet prep today positive for clue cells - treat with topical Flagyl x 7 days.  Swab sent for gonorrhea & chlamydia testing.  No trichomonas today.  - WET PREP FOR TRICH, YEAST, CLUE  3. Grief reaction Acute.  Urgent referral placed for Psychology and discussed cell phone applications that she can try starting today.  Encouraged to reach out to family & friends.  No SI/HI today.  - Ambulatory referral to Psychology  4. Bacterial vaginosis  - metroNIDAZOLE (METROGEL VAGINAL) 0.75 % vaginal gel; Place 1 Applicatorful vaginally at bedtime.  Dispense: 70 g; Refill: 0  5. Rash Most like atopic dermatitis. Start emollient like vaseline daily.  If no better after 1 week, can try OTC antifungal.  Follow up with no improvement.   Follow up plan: Return if symptoms worsen or fail to improve.

## 2021-04-01 NOTE — Patient Instructions (Addendum)
Nice to see you today.   [employer] [facility] is the application you can download on your cell phone to start therapy.  I have also put a referral in our system and you should hear back sometime next week to schedule an appointment.  We will let you know about the results when they come back.  For the rash - try an OTC antifungal like clotrimazole.  If this does not help after a few days, let me know and we can send in a steroid cream.  Let me know if there is anything you need in the meantime.  Hang in there! Joan Andrews   Caring for [name] is emotional, psychological, and social well-being. Mental health is just as important as physical health. In fact, mental and physical health are connected, and you need both to be healthy. Some signs of good mental health (well-being) include: Being able to attend to tasks at home, school, or work. Being able to manage stress and emotions. Practicing self-care, which may include: A regular exercise pattern. A reasonably healthy diet. Supportive and trusting relationships. The ability to relax and calm yourself (self-calm). Having pleasurable hobbies and activities to do. Believing that you have meaning and purpose in your life. Recovering and adjusting after facing challenges (resilience). You can take steps to build or strengthen these mentally healthy behaviors.There are resources and support to help you with this. Why is caring for mental health important? Caring for your mental health is a big part of staying healthy. Everyone has times when feelings, thoughts, or situations feel overwhelming. Mental health means having the skills to manage what feels overwhelming. If this sense of being overwhelmed persists, however, you might need some help. If you have some of the following signs, you may need to take better care of your mental health or seek help from a health care provider or mental health professional: Problems with energy or  focus. Changes in eating habits. Problems sleeping, such as sleeping too much or not enough. Emotional distress, such as anger, sadness, depression, or anxiety. Major changes in your relationships. Losing interest in life or activities that you used to enjoy. If you have any of these symptoms on most days for 2 weeks or longer: Talk with a close friend or family member about how you are feeling. Contact your health care provider to discuss your symptoms. Consider working with a Education officer, community. Your health care provider, family, or friends may be able to recommend a therapist. What can I do to promote emotional and mental health? Managing emotions Learn to identify emotions and deal with them. Recognizing your emotions is the first step in learning to deal with them. Practice ways to appropriately express feelings. Remember that you can control your feelings. They do not control you. Practice stress management techniques, such as: Relaxation techniques, like breathing or muscle relaxation exercises. Exercise. Regular activity can lower your stress level. Changing what you can change and accepting what you cannot change. Build up your resilience so that you can recover and adjust after big problems or challenges. Practice resilient behaviors and attitudes: Set and focus on long-term goals. Develop and maintain healthy, supportive relationships. Learn to accept change and make the best of the situation. Take care of yourself physically by eating a healthy diet, getting plenty of sleep, and exercising regularly. Develop self-awareness. Ask others to give feedback about how they see you. Practice mindfulness meditation to help you stay calm when dealing with daily challenges. Learn  to respond to situations in healthy ways, rather than reacting with your emotions. Keep a positive attitude, and believe in yourself. Your view of yourself affects your mental health. Develop your  listening and empathy skills. These will help you deal with difficult situations and communications. Remember that emotions can be used as a good source of communication and are a great source of energy. Try to laugh and find humor in life. Sleeping Get the right amount and quality of sleep. Sleep has a big impact on physical and mental health. To improve your sleep: Go to bed and wake up around the same time every day. Limit screen time before bedtime. This includes the use of your cell phone, TV, computer, and tablet. Keep your bedroom dark and cool. Activity  Exercise or do some physical activity regularly. This helps: Keep your body strong, especially during times of stress. Get rid of chemicals in your body (hormones) that build up when you are stressed. Build up your resilience.  Eating and drinking  Eat a healthy diet that includes whole grains, vegetables, fresh fruits, and lean proteins. If you have questions about what foods are best for you, ask your health care provider. Try not to turn to sweet, salty, or otherwise unhealthy foods when you are tired or unhappy. This can lead to unwanted weight gain and is not a healthy way to cope with emotions.  Where to find more information You can find more information about how to care for your mental health from: [employer] on Mental Illness (NAMI): www.nami.[place]: https://carter.com/ Centers for Disease Control and Prevention: RapLives.dk Contact a health care provider if: You lose interest in being with others or you do not want to leave the house. You have a hard time completing your normal activities or you have less energy than normal. You cannot stay focused or you have problems with memory. You feel that your senses are heightened, and this makes you upset or concerned. You feel nervous or have rapid mood changes. You are sleeping or eating more or less than normal. You  question reality or you show odd behavior that disturbs you or others. Get help right away if: You have thoughts about hurting yourself or others. If you ever feel like you may hurt yourself or others, or have thoughts about taking your own life, get help right away. You can go to your nearest emergency department or call: Your local emergency services (911 in the [place] A suicide crisis helpline, such as the [facility] at (763)403-5732. This is open 24 hours a day. Summary Mental health is not just the absence of mental illness. It involves understanding your emotions and behaviors, and taking steps to cope with them in a healthy way. If you have symptoms of mental or emotional distress, get help from family, friends, a health care provider, or a mental health professional. Practice good mental health behaviors such as stress management skills, self-calming skills, exercise, and healthy sleeping and eating. This information is not intended to replace advice given to you by your health care provider. Make sure you discuss any questions you have with your healthcare provider. Document Revised: 01/22/2020 Document Reviewed: 01/22/2020 Elsevier Patient Education  [place].

## 2021-04-03 LAB — C. TRACHOMATIS/N. GONORRHOEAE RNA
C. trachomatis RNA, TMA: DETECTED — AB
N. gonorrhoeae RNA, TMA: DETECTED — AB

## 2021-04-04 ENCOUNTER — Encounter: Payer: Self-pay | Admitting: Nurse Practitioner

## 2021-04-04 MED ORDER — DOXYCYCLINE HYCLATE 100 MG PO TABS
100.0000 mg | ORAL_TABLET | Freq: Two times a day (BID) | ORAL | 0 refills | Status: AC
Start: 2021-04-04 — End: 2021-04-11

## 2021-04-04 MED ORDER — CEFTRIAXONE SODIUM 500 MG IJ SOLR
500.0000 mg | Freq: Once | INTRAMUSCULAR | Status: AC
  Administered 2021-04-06: 500 mg via INTRAMUSCULAR

## 2021-04-04 NOTE — Addendum Note (Signed)
Addended by: Noemi Chapel A on: 04/04/2021 12:40 PM   Modules accepted: Orders

## 2021-04-06 ENCOUNTER — Other Ambulatory Visit: Payer: Self-pay

## 2021-04-06 ENCOUNTER — Ambulatory Visit (INDEPENDENT_AMBULATORY_CARE_PROVIDER_SITE_OTHER): Payer: Self-pay | Admitting: *Deleted

## 2021-04-06 DIAGNOSIS — A549 Gonococcal infection, unspecified: Secondary | ICD-10-CM

## 2021-04-06 DIAGNOSIS — A749 Chlamydial infection, unspecified: Secondary | ICD-10-CM

## 2021-04-07 ENCOUNTER — Encounter: Payer: Self-pay | Admitting: Nurse Practitioner

## 2021-04-27 ENCOUNTER — Encounter: Payer: Self-pay | Admitting: Nurse Practitioner

## 2021-05-26 ENCOUNTER — Telehealth: Payer: Self-pay | Admitting: Physician Assistant

## 2021-05-26 DIAGNOSIS — G8929 Other chronic pain: Secondary | ICD-10-CM

## 2021-05-26 DIAGNOSIS — M79604 Pain in right leg: Secondary | ICD-10-CM

## 2021-05-26 MED ORDER — MELOXICAM 15 MG PO TABS: 15.0000 mg | ORAL_TABLET | Freq: Every day | ORAL | 0 refills | Status: DC

## 2021-05-26 NOTE — Progress Notes (Signed)
Virtual Visit Consent   Joan Andrews, you are scheduled for a virtual visit with a [facility] provider today.     Just as with appointments in the office, your consent must be obtained to participate.  Your consent will be active for this visit and any virtual visit you may have with one of our providers in the next 365 days.     If you have a MyChart account, a copy of this consent can be sent to you electronically.  All virtual visits are billed to your insurance company just like a traditional visit in the office.    As this is a virtual visit, video technology does not allow for your provider to perform a traditional examination.  This may limit your provider's ability to fully assess your condition.  If your provider identifies any concerns that need to be evaluated in person or the need to arrange testing (such as labs, EKG, etc.), we will make arrangements to do so.     Although advances in technology are sophisticated, we cannot ensure that it will always work on either your end or our end.  If the connection with a video visit is poor, the visit may have to be switched to a telephone visit.  With either a video or telephone visit, we are not always able to ensure that we have a secure connection.     I need to obtain your verbal consent now.   Are you willing to proceed with your visit today?    Joan Andrews has provided verbal consent on 05/26/2021 for a virtual visit (video or telephone).   Leeanne Rio, [place]   Date: 05/26/2021 7:13 PM   Virtual Visit via Video Note   I, Leeanne Rio, connected with  Joan Andrews  ([id number], 10/03/1995) on 05/26/21 at  7:00 PM EDT by a video-enabled telemedicine application and verified that I am speaking with the correct person using two identifiers.  Location: Patient: Virtual Visit Location Patient: Home Provider: Virtual Visit Location Provider: Home Office   I discussed the limitations of  evaluation and management by telemedicine and the availability of in person appointments. The patient expressed understanding and agreed to proceed.    History of Present Illness: Joan Andrews is a 25 y.o. 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 Notes pain in anterior knee and mid leg, sometimes with posterior pain as well. Denies numbness, tingling or weakness. Denies swelling, bruising or other skin changes. Worse with ambulation. Is unsure if worse with going down stairs as she does not have to do that often. Denies hip pain or ankle pain of R side. Denies symptoms of LLE.  Has taken OTC ibuprofen with only slight relief. . 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  States initially was given a leg brace but it is not usable currently. She would like to see if she can get another leg brace to use in addition to treatment recommendations until she can follow-up in office with PCP.    HPI: HPI  Problems:  Patient Active Problem List   Diagnosis Date Noted   S/P bilateral breast reduction 12/09/2019   Obesity (BMI 30-39.9) 08/27/2019   Encounter  for IUD insertion 03/07/2018   Seizure-like activity ([place]) 10/03/2017   Uterine fibroid 07/24/2017   History of chlamydia 02/16/2016    Allergies: No Known Allergies Medications:  Current Outpatient Medications:    meloxicam (MOBIC) 15 MG tablet, Take 1 tablet (15 mg total) by mouth daily., Disp: 30 tablet, Rfl: 0   levonorgestrel (MIRENA) 20 MCG/24HR IUD, 1 each by Intrauterine route once., Disp: , Rfl:   Observations/Objective: Patient is well-developed, well-nourished in no acute distress.  Resting comfortably at home.  Head is normocephalic, atraumatic.  No labored breathing. Speech  is clear and coherent with logical content.  Patient is alert and oriented at baseline.    Assessment and Plan: 1. Chronic pain of right lower extremity - DME Crutches - For home use only DME Other see comment - meloxicam (MOBIC) 15 MG tablet; Take 1 tablet (15 mg total) by mouth daily.  Dispense: 30 tablet; Refill: 0 Possible combination of knee arthropathy along with patellofemoral strain, etc. Cannot fully examine via video. Recommend in person assessment with PCP as she may require imaging and PT/SM referral. Will have her start Mobic once daily with food. Tylenol ES for breakthrough pain. Elevate leg while resting. Will attempt to send in leg brace as DME but she may have to get OTC or discuss with PCP if insurance requires an in-person F2F visit for this.   Follow Up Instructions: I discussed the assessment and treatment plan with the patient. The patient was provided an opportunity to ask questions and all were answered. The patient agreed with the plan and demonstrated an understanding of the instructions.  A copy of instructions were sent to the patient via MyChart unless otherwise noted below.   The patient was advised to call back or seek an in-person evaluation if the symptoms worsen or if the condition fails to improve as anticipated.  Time:  I spent 15 minutes with the patient via telehealth technology discussing the above problems/concerns.    Leeanne Rio, PA-C

## 2021-06-03 ENCOUNTER — Encounter: Payer: Self-pay | Admitting: Nurse Practitioner

## 2021-07-12 ENCOUNTER — Encounter (HOSPITAL_COMMUNITY): Payer: Self-pay | Admitting: *Deleted

## 2021-07-12 ENCOUNTER — Other Ambulatory Visit: Payer: Self-pay

## 2021-07-12 ENCOUNTER — Emergency Department (HOSPITAL_COMMUNITY)
Admission: EM | Admit: 2021-07-12 | Discharge: 2021-07-12 | Disposition: A | Discharge status: Home / Self Care | Payer: 59 | Attending: Emergency Medicine | Admitting: Emergency Medicine

## 2021-07-12 DIAGNOSIS — J101 Influenza due to other identified influenza virus with other respiratory manifestations: Secondary | ICD-10-CM | POA: Insufficient documentation

## 2021-07-12 DIAGNOSIS — Z20822 Contact with and (suspected) exposure to covid-19: Secondary | ICD-10-CM | POA: Diagnosis not present

## 2021-07-12 DIAGNOSIS — J029 Acute pharyngitis, unspecified: Secondary | ICD-10-CM | POA: Diagnosis present

## 2021-07-12 LAB — RESP PANEL BY RT-PCR (FLU A&B, COVID) ARPGX2
Influenza A by PCR: POSITIVE — AB
Influenza B by PCR: NEGATIVE
SARS Coronavirus 2 by RT PCR: NEGATIVE

## 2021-07-12 MED ORDER — OSELTAMIVIR PHOSPHATE 75 MG PO CAPS: 75.0000 mg | ORAL_CAPSULE | Freq: Two times a day (BID) | ORAL | 0 refills | Status: DC

## 2021-07-12 NOTE — ED Provider Notes (Signed)
[facility] Provider Note   CSN: [id number] Arrival date & time: 07/12/21  4010     History Chief Complaint  Patient presents with   Sore Throat    Joan Andrews is a 25 y.o. female.  Patient complains of sore throat and body aches.  The history is provided by the patient and medical records. No language interpreter was used.  Sore Throat This is a new problem. The current episode started 2 days ago. The problem occurs constantly. The problem has not changed since onset.Pertinent negatives include no chest pain, no abdominal pain and no headaches. Nothing aggravates the symptoms. Nothing relieves the symptoms. She has tried nothing for the symptoms.      Past Medical History:  Diagnosis Date   Bronchitis    as a child only   Contraceptive management 12/26/2013   Recurrent tonsillitis    Seizures ([place])    Questionable x1    Patient Active Problem List   Diagnosis Date Noted   S/P bilateral breast reduction 12/09/2019   Obesity (BMI 30-39.9) 08/27/2019   Encounter for IUD insertion 03/07/2018   Seizure-like activity ([place]) 10/03/2017   Uterine fibroid 07/24/2017   History of chlamydia 02/16/2016    Past Surgical History:  Procedure Laterality Date   BREAST REDUCTION SURGERY Bilateral 11/04/2019   Procedure: BILATERAL MAMMARY REDUCTION  (BREAST);  Surgeon: [name], MD;  Location: [facility];  Service: Plastics;  Laterality: Bilateral;   HERNIA REPAIR       OB History     Gravida  3   Para  2   Term  2   Preterm      AB  1   Living  2      SAB  1   IAB      Ectopic      Multiple  0   Live Births  2           Family History  Problem Relation Age of Onset   Hypertension Father    Alzheimer's disease Maternal Grandmother    Hypertension Mother    Diabetes Paternal Uncle    Diabetes Cousin     Social History   Tobacco Use   Smoking status: Never   Smokeless tobacco: Never  Vaping  Use   Vaping Use: Never used  Substance Use Topics   Alcohol use: No   Drug use: No    Home Medications Prior to Admission medications   Medication Sig Start Date End Date Taking? Authorizing Provider  aspirin-acetaminophen-caffeine (EXCEDRIN MIGRAINE) [phone] MG tablet Take 1 tablet by mouth every 6 (six) hours as needed for headache.   Yes [provider]  levonorgestrel (MIRENA) 20 MCG/24HR IUD 1 each by Intrauterine route once.   Yes [provider]  oseltamivir (TAMIFLU) 75 MG capsule Take 1 capsule (75 mg total) by mouth 2 (two) times daily. 07/12/21  Yes Milton Ferguson, MD  meloxicam (MOBIC) 15 MG tablet Take 1 tablet (15 mg total) by mouth daily. Patient not taking: Reported on 07/12/2021 05/26/21   Brunetta Jeans, PA-C    Allergies    Patient has no known allergies.  Review of Systems   Review of Systems  Constitutional:  Negative for appetite change and fatigue.  HENT:  Negative for congestion, ear discharge and sinus pressure.   Eyes:  Negative for discharge.  Respiratory:  Positive for cough.   Cardiovascular:  Negative for chest pain.  Gastrointestinal:  Negative for  abdominal pain and diarrhea.  Genitourinary:  Negative for frequency and hematuria.  Musculoskeletal:  Negative for back pain.  Skin:  Negative for rash.  Neurological:  Negative for seizures and headaches.  Psychiatric/Behavioral:  Negative for hallucinations.    Physical Exam Updated Vital Signs BP 120/78 (BP Location: Right Arm)   Pulse 99   Temp 99 F (37.2 C) (Oral)   Resp 16   Ht 5\' 5"  (1.651 m)   Wt 93 kg   SpO2 98%   BMI 34.11 kg/m   Physical Exam Vitals and nursing note reviewed.  Constitutional:      Appearance: She is well-developed.  HENT:     Head: Normocephalic.     Nose: Nose normal.  Eyes:     General: No scleral icterus.    Conjunctiva/sclera: Conjunctivae normal.  Neck:     Thyroid: No thyromegaly.  Cardiovascular:     Rate and Rhythm:  Normal rate and regular rhythm.     Heart sounds: No murmur heard.   No friction rub. No gallop.  Pulmonary:     Breath sounds: No stridor. No wheezing or rales.  Chest:     Chest wall: No tenderness.  Abdominal:     General: There is no distension.     Tenderness: There is no abdominal tenderness. There is no rebound.  Musculoskeletal:        General: Normal range of motion.     Cervical back: Neck supple.  Lymphadenopathy:     Cervical: No cervical adenopathy.  Skin:    Findings: No erythema or rash.  Neurological:     Mental Status: She is alert and oriented to person, place, and time.     Motor: No abnormal muscle tone.     Coordination: Coordination normal.  Psychiatric:        Behavior: Behavior normal.    ED Results / Procedures / Treatments   Labs (all labs ordered are listed, but only abnormal results are displayed) Labs Reviewed  RESP PANEL BY RT-PCR (FLU A&B, COVID) ARPGX2 - Abnormal; Notable for the following components:      Result Value   Influenza A by PCR POSITIVE (*)    All other components within normal limits    EKG None  Radiology No results found.  Procedures Procedures   Medications Ordered in ED Medications - No data to display  ED Course  I have reviewed the triage vital signs and the nursing notes.  Pertinent labs & imaging results that were available during my care of the patient were reviewed by me and considered in my medical decision making (see chart for details).    MDM Rules/Calculators/A&P                           Patient with influenza.  She is given Tamiflu and will follow-up as needed Final Clinical Impression(s) / ED Diagnoses Final diagnoses:  Influenza A    Rx / DC Orders ED Discharge Orders          Ordered    oseltamivir (TAMIFLU) 75 MG capsule  2 times daily        07/12/21 1053             Milton Ferguson, MD 07/12/21 1056

## 2021-07-12 NOTE — ED Triage Notes (Signed)
Pt c/o sore throat, chills, body aches, diarrhea, fever, loss of taste and smell that started Sunday.

## 2021-08-10 ENCOUNTER — Other Ambulatory Visit: Payer: Self-pay

## 2021-08-10 ENCOUNTER — Ambulatory Visit (INDEPENDENT_AMBULATORY_CARE_PROVIDER_SITE_OTHER): Payer: 59 | Admitting: Nurse Practitioner

## 2021-08-10 ENCOUNTER — Telehealth: Payer: Self-pay | Admitting: Nurse Practitioner

## 2021-08-10 VITALS — BP 110/78 | HR 83 | Temp 98.8°F | Ht 65.0 in | Wt 206.0 lb

## 2021-08-10 DIAGNOSIS — R8271 Bacteriuria: Secondary | ICD-10-CM

## 2021-08-10 DIAGNOSIS — N760 Acute vaginitis: Secondary | ICD-10-CM

## 2021-08-10 DIAGNOSIS — B9689 Other specified bacterial agents as the cause of diseases classified elsewhere: Secondary | ICD-10-CM | POA: Diagnosis not present

## 2021-08-10 LAB — URINALYSIS, ROUTINE W REFLEX MICROSCOPIC
Bilirubin Urine: NEGATIVE
Glucose, UA: NEGATIVE
Hgb urine dipstick: NEGATIVE
Hyaline Cast: NONE SEEN /LPF
Ketones, ur: NEGATIVE
Nitrite: NEGATIVE
Protein, ur: NEGATIVE
Specific Gravity, Urine: 1.025 (ref 1.001–1.035)
pH: 6 (ref 5.0–8.0)

## 2021-08-10 LAB — PREGNANCY, URINE: Preg Test, Ur: NEGATIVE

## 2021-08-10 LAB — MICROSCOPIC MESSAGE

## 2021-08-10 LAB — WET PREP FOR TRICH, YEAST, CLUE

## 2021-08-10 MED ORDER — METRONIDAZOLE 500 MG PO TABS: 500.0000 mg | ORAL_TABLET | Freq: Two times a day (BID) | ORAL | 0 refills | Status: AC

## 2021-08-10 NOTE — Progress Notes (Signed)
Subjective:    Patient ID: Joan Andrews, female    DOB: [birth year], 25 y.o.   MRN: [id number]  HPI: Joan Andrews is a 25 y.o. female presenting for follow up.  Chief Complaint  Patient presents with   Medication Problem    Vaginal gel not working for patient   VAGINAL DISCHARGE Reports 4 months ago, she was treated with MetroGel for bacterial vaginosis.  She also tested positive for gonorrhea and chlamydia at that visit.  She reports completing the full course of antibiotics.  Reports vaginal discharge never improved. Duration: months Discharge description: yellow, slimely  Pruritus: no Dysuria: no Malodorous: no Urinary frequency: no Fevers: no Abdominal pain: no  Sexual activity: no sexually activity in last 2 months  History of sexually transmitted diseases: yes Recent antibiotic use: none in past 3 months  Context: stable Treatments attempted: nothing  No Known Allergies  Outpatient Encounter Medications as of 08/10/2021  Medication Sig   levonorgestrel (MIRENA) 20 MCG/24HR IUD 1 each by Intrauterine route once.   metroNIDAZOLE (FLAGYL) 500 MG tablet Take 1 tablet (500 mg total) by mouth 2 (two) times daily for 7 days.   [DISCONTINUED] aspirin-acetaminophen-caffeine (EXCEDRIN MIGRAINE) 250-250-65 MG tablet Take 1 tablet by mouth every 6 (six) hours as needed for headache. (Patient not taking: Reported on 08/10/2021)   [DISCONTINUED] meloxicam (MOBIC) 15 MG tablet Take 1 tablet (15 mg total) by mouth daily. (Patient not taking: Reported on 07/12/2021)   [DISCONTINUED] oseltamivir (TAMIFLU) 75 MG capsule Take 1 capsule (75 mg total) by mouth 2 (two) times daily. (Patient not taking: Reported on 08/10/2021)   No facility-administered encounter medications on file as of 08/10/2021.    Patient Active Problem List   Diagnosis Date Noted   S/P bilateral breast reduction 12/09/2019   Obesity (BMI 30-39.9) 08/27/2019   Encounter for IUD insertion  03/07/2018   Seizure-like activity ([place]) 10/03/2017   Uterine fibroid 07/24/2017   History of chlamydia 02/16/2016    Past Medical History:  Diagnosis Date   Bronchitis    as a child only   Contraceptive management 12/26/2013   Recurrent tonsillitis    Seizures ([place])    Questionable x1    Relevant past medical, surgical, family and social history reviewed and updated as indicated. Interim medical history since our last visit reviewed.  Review of Systems Per HPI unless specifically indicated above     Objective:    BP 110/78 (BP Location: Right Arm, Patient Position: Sitting, Cuff Size: Normal)    Pulse 83    Temp 98.8 F (37.1 C)    Ht 5\' 5"  (1.651 m)    Wt 206 lb (93.4 kg)    SpO2 99%    BMI 34.28 kg/m   Wt Readings from Last 3 Encounters:  08/10/21 206 lb (93.4 kg)  07/12/21 205 lb (93 kg)  04/01/21 191 lb (86.6 kg)    Physical Exam Vitals and nursing note reviewed. Exam conducted with a chaperone present Elizabeth Palau, CMA).  Constitutional:      General: She is not in acute distress.    Appearance: Normal appearance. She is not toxic-appearing.  Genitourinary:    General: Normal vulva.     Exam position: Lithotomy position.     Pubic Area: No rash.      Labia:        Right: No rash or tenderness.        Left: No rash or tenderness.      Vagina:  Vaginal discharge present.     Cervix: No erythema.     Uterus: Normal.      Adnexa: Right adnexa normal and left adnexa normal.     Comments: Thin, white discharge Lymphadenopathy:     Lower Body: No right inguinal adenopathy. No left inguinal adenopathy.  Skin:    General: Skin is warm and dry.     Coloration: Skin is not jaundiced or pale.     Findings: No erythema.  Neurological:     Mental Status: She is alert and oriented to person, place, and time.  Psychiatric:        Mood and Affect: Mood normal.        Behavior: Behavior normal.        Thought Content: Thought content normal.        Judgment: Judgment  normal.      Assessment & Plan:  1. Bacterial vaginosis Acute.  Wet prep today positive for clue cells-we will treat with Flagyl 500 mg twice daily for 7 days.  We will also check gonorrhea chlamydia testing.  Urine pregnancy today negative.  Urinalysis today did show some bacteria-we will send off for culture.  Follow-up if discharge persists despite treatment.  - WET PREP FOR TRICH, YEAST, CLUE - Urinalysis, Routine w reflex microscopic - C. trachomatis/N. gonorrhoeae RNA - Pregnancy, urine - metroNIDAZOLE (FLAGYL) 500 MG tablet; Take 1 tablet (500 mg total) by mouth 2 (two) times daily for 7 days.  Dispense: 14 tablet; Refill: 0  2. Bacteriuria  - Urine Culture     Follow up plan: Return if symptoms worsen or fail to improve.

## 2021-08-10 NOTE — Telephone Encounter (Signed)
Patient disputing amount of bill from 04/01/21. States insurance wasn't billed. Please send bill to [facility] before end of 2022.  Please advise at [phone].

## 2021-08-11 LAB — C. TRACHOMATIS/N. GONORRHOEAE RNA
C. trachomatis RNA, TMA: NOT DETECTED
N. gonorrhoeae RNA, TMA: NOT DETECTED

## 2021-08-12 LAB — URINE CULTURE
MICRO NUMBER:: 12808672
SPECIMEN QUALITY:: ADEQUATE

## 2021-08-12 NOTE — Telephone Encounter (Signed)
I have submitted DOS 8/169/22 to [facility] and patient is aware.

## 2022-01-13 ENCOUNTER — Ambulatory Visit: Payer: Self-pay

## 2022-03-15 ENCOUNTER — Other Ambulatory Visit: Payer: Self-pay

## 2022-03-15 ENCOUNTER — Encounter (HOSPITAL_COMMUNITY): Payer: Self-pay | Admitting: Emergency Medicine

## 2022-03-15 ENCOUNTER — Emergency Department (HOSPITAL_COMMUNITY)
Admission: EM | Admit: 2022-03-15 | Discharge: 2022-03-15 | Disposition: A | Discharge status: Home / Self Care | Payer: Self-pay | Attending: Emergency Medicine | Admitting: Emergency Medicine

## 2022-03-15 DIAGNOSIS — U071 COVID-19: Secondary | ICD-10-CM | POA: Insufficient documentation

## 2022-03-15 MED ORDER — NIRMATRELVIR/RITONAVIR (PAXLOVID)TABLET: 3.0000 | ORAL_TABLET | Freq: Two times a day (BID) | ORAL | 0 refills | Status: AC

## 2022-03-15 NOTE — ED Triage Notes (Signed)
Pt states she tested positive a couple of hours ago and needs a note for work.

## 2022-03-15 NOTE — ED Provider Notes (Signed)
[facility] EMERGENCY DEPARTMENT Provider Note   CSN: [id number] Arrival date & time: 03/15/22  0024     History {Add pertinent medical, surgical, social history, OB history to HPI:1} Chief Complaint  Patient presents with  . Covid positive    Joan Andrews is a 26 y.o. female.  2 days of sore throat, nasal congestion. Tested positive for covid at home. Has had 2 vaccinations.      Home Medications Prior to Admission medications   Medication Sig Start Date End Date Taking? Authorizing Provider  nirmatrelvir/ritonavir EUA (PAXLOVID) 20 x 150 MG & 10 x '100MG'$  TABS Take 3 tablets by mouth 2 (two) times daily for 5 days. Patient GFR is >60. Take nirmatrelvir (150 mg) two tablets twice daily for 5 days and ritonavir (100 mg) one tablet twice daily for 5 days. 03/15/22 03/20/22 Yes Marializ Ferrebee, Gwenyth Allegra, MD  levonorgestrel (MIRENA) 20 MCG/24HR IUD 1 each by Intrauterine route once.    [provider]      Allergies    Patient has no known allergies.    Review of Systems   Review of Systems  Physical Exam Updated Vital Signs BP 107/75   Pulse 86   Temp 99.3 F (37.4 C)   Resp 18   Ht '5\' 7"'$  (1.702 m)   Wt 94.3 kg   SpO2 100%   BMI 32.58 kg/m  Physical Exam Vitals and nursing note reviewed.  Constitutional:      General: She is not in acute distress.    Appearance: She is well-developed.  HENT:     Head: Normocephalic and atraumatic.     Mouth/Throat:     Mouth: Mucous membranes are moist.     Pharynx: Posterior oropharyngeal erythema present.  Eyes:     General: Vision grossly intact. Gaze aligned appropriately.     Extraocular Movements: Extraocular movements intact.     Conjunctiva/sclera: Conjunctivae normal.  Cardiovascular:     Rate and Rhythm: Normal rate and regular rhythm.     Pulses: Normal pulses.     Heart sounds: Normal heart sounds, S1 normal and S2 normal. No murmur heard.    No friction rub. No gallop.  Pulmonary:     Effort:  Pulmonary effort is normal. No respiratory distress.     Breath sounds: Normal breath sounds.  Abdominal:     General: Bowel sounds are normal.     Palpations: Abdomen is soft.     Tenderness: There is no abdominal tenderness. There is no guarding or rebound.     Hernia: No hernia is present.  Musculoskeletal:        General: No swelling.     Cervical back: Full passive range of motion without pain, normal range of motion and neck supple. No spinous process tenderness or muscular tenderness. Normal range of motion.     Right lower leg: No edema.     Left lower leg: No edema.  Skin:    General: Skin is warm and dry.     Capillary Refill: Capillary refill takes less than 2 seconds.     Findings: No ecchymosis, erythema, rash or wound.  Neurological:     General: No focal deficit present.     Mental Status: She is alert and oriented to person, place, and time.     GCS: GCS eye subscore is 4. GCS verbal subscore is 5. GCS motor subscore is 6.     Cranial Nerves: Cranial nerves 2-12 are intact.  Sensory: Sensation is intact.     Motor: Motor function is intact.     Coordination: Coordination is intact.  Psychiatric:        Attention and Perception: Attention normal.        Mood and Affect: Mood normal.        Speech: Speech normal.        Behavior: Behavior normal.    ED Results / Procedures / Treatments   Labs (all labs ordered are listed, but only abnormal results are displayed) Labs Reviewed - No data to display  EKG None  Radiology No results found.  Procedures Procedures  {Document cardiac monitor, telemetry assessment procedure when appropriate:1}  Medications Ordered in ED Medications - No data to display  ED Course/ Medical Decision Making/ A&P                           Medical Decision Making  ***  {Document critical care time when appropriate:1} {Document review of labs and clinical decision tools ie heart score, Chads2Vasc2 etc:1}  {Document your  independent review of radiology images, and any outside records:1} {Document your discussion with family members, caretakers, and with consultants:1} {Document social determinants of health affecting pt's care:1} {Document your decision making why or why not admission, treatments were needed:1} Final Clinical Impression(s) / ED Diagnoses Final diagnoses:  COVID-19    Rx / DC Orders ED Discharge Orders          Ordered    nirmatrelvir/ritonavir EUA (PAXLOVID) 20 x 150 MG & 10 x '100MG'$  TABS  2 times daily        03/15/22 0041

## 2022-05-19 DIAGNOSIS — Z111 Encounter for screening for respiratory tuberculosis: Secondary | ICD-10-CM | POA: Diagnosis not present

## 2022-05-19 DIAGNOSIS — Z23 Encounter for immunization: Secondary | ICD-10-CM | POA: Diagnosis not present

## 2022-05-19 DIAGNOSIS — Z789 Other specified health status: Secondary | ICD-10-CM | POA: Diagnosis not present

## 2022-05-30 DIAGNOSIS — Z23 Encounter for immunization: Secondary | ICD-10-CM | POA: Diagnosis not present

## 2022-06-30 DIAGNOSIS — Z23 Encounter for immunization: Secondary | ICD-10-CM | POA: Diagnosis not present

## 2022-07-04 ENCOUNTER — Ambulatory Visit
Admission: EM | Admit: 2022-07-04 | Discharge: 2022-07-04 | Disposition: A | Discharge status: Home / Self Care | Payer: 59 | Attending: Family Medicine | Admitting: Family Medicine

## 2022-07-04 ENCOUNTER — Encounter: Payer: Self-pay | Admitting: Emergency Medicine

## 2022-07-04 DIAGNOSIS — M79601 Pain in right arm: Secondary | ICD-10-CM

## 2022-07-04 MED ORDER — NAPROXEN 500 MG PO TABS: 500.0000 mg | ORAL_TABLET | Freq: Two times a day (BID) | ORAL | 0 refills | Status: DC | PRN

## 2022-07-04 MED ORDER — TIZANIDINE HCL 4 MG PO CAPS: 4.0000 mg | ORAL_CAPSULE | Freq: Three times a day (TID) | ORAL | 0 refills | Status: DC
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
## 2022-07-28 DIAGNOSIS — Z789 Other specified health status: Secondary | ICD-10-CM | POA: Diagnosis not present

## 2022-08-01 DIAGNOSIS — R051 Acute cough: Secondary | ICD-10-CM | POA: Diagnosis not present

## 2022-08-01 DIAGNOSIS — J069 Acute upper respiratory infection, unspecified: Secondary | ICD-10-CM | POA: Diagnosis not present

## 2022-08-01 DIAGNOSIS — Z20822 Contact with and (suspected) exposure to covid-19: Secondary | ICD-10-CM | POA: Diagnosis not present

## 2022-08-01 DIAGNOSIS — R6883 Chills (without fever): Secondary | ICD-10-CM | POA: Diagnosis not present

## 2022-08-01 DIAGNOSIS — Z1152 Encounter for screening for COVID-19: Secondary | ICD-10-CM | POA: Diagnosis not present

## 2022-08-01 DIAGNOSIS — R059 Cough, unspecified: Secondary | ICD-10-CM | POA: Diagnosis not present

## 2022-08-03 DIAGNOSIS — J069 Acute upper respiratory infection, unspecified: Secondary | ICD-10-CM | POA: Diagnosis not present

## 2022-08-31 DIAGNOSIS — Z6835 Body mass index (BMI) 35.0-35.9, adult: Secondary | ICD-10-CM | POA: Diagnosis not present

## 2022-08-31 DIAGNOSIS — R87612 Low grade squamous intraepithelial lesion on cytologic smear of cervix (LGSIL): Secondary | ICD-10-CM | POA: Diagnosis not present

## 2022-08-31 DIAGNOSIS — Z124 Encounter for screening for malignant neoplasm of cervix: Secondary | ICD-10-CM | POA: Diagnosis not present

## 2022-08-31 DIAGNOSIS — Z0000 Encounter for general adult medical examination without abnormal findings: Secondary | ICD-10-CM | POA: Diagnosis not present

## 2022-08-31 DIAGNOSIS — R69 Illness, unspecified: Secondary | ICD-10-CM | POA: Diagnosis not present

## 2022-08-31 DIAGNOSIS — N760 Acute vaginitis: Secondary | ICD-10-CM | POA: Diagnosis not present

## 2022-08-31 DIAGNOSIS — Z713 Dietary counseling and surveillance: Secondary | ICD-10-CM | POA: Diagnosis not present

## 2022-08-31 DIAGNOSIS — N898 Other specified noninflammatory disorders of vagina: Secondary | ICD-10-CM | POA: Diagnosis not present

## 2022-09-14 ENCOUNTER — Telehealth: Payer: Self-pay

## 2022-09-14 NOTE — Telephone Encounter (Unsigned)
Spoke to patient I did get part of a pap for her but not the referral. I spoke to Dr. Elonda Husky and her reviewed the what we received

## 2022-09-18 ENCOUNTER — Telehealth: Payer: Self-pay

## 2022-09-18 NOTE — Telephone Encounter (Signed)
We received a fax from [facility] for referral, only received for the Thinprep tis pap, provider wanted to see the cytology report left message with [facility], on Friday Feb 2,2024. I called [facility] back this morning, they stated that patient has requested to [facility] so to cancel the referral.ep

## 2022-10-17 DIAGNOSIS — Z975 Presence of (intrauterine) contraceptive device: Secondary | ICD-10-CM | POA: Diagnosis not present

## 2022-10-17 DIAGNOSIS — R87612 Low grade squamous intraepithelial lesion on cytologic smear of cervix (LGSIL): Secondary | ICD-10-CM | POA: Diagnosis not present

## 2022-10-17 DIAGNOSIS — B9689 Other specified bacterial agents as the cause of diseases classified elsewhere: Secondary | ICD-10-CM | POA: Diagnosis not present

## 2022-10-17 DIAGNOSIS — N760 Acute vaginitis: Secondary | ICD-10-CM | POA: Diagnosis not present

## 2023-03-21 DIAGNOSIS — L709 Acne, unspecified: Secondary | ICD-10-CM | POA: Diagnosis not present

## 2023-03-21 DIAGNOSIS — E669 Obesity, unspecified: Secondary | ICD-10-CM | POA: Diagnosis not present

## 2023-03-21 DIAGNOSIS — L819 Disorder of pigmentation, unspecified: Secondary | ICD-10-CM | POA: Diagnosis not present

## 2023-03-21 DIAGNOSIS — F411 Generalized anxiety disorder: Secondary | ICD-10-CM | POA: Diagnosis not present

## 2023-03-21 DIAGNOSIS — F329 Major depressive disorder, single episode, unspecified: Secondary | ICD-10-CM | POA: Diagnosis not present

## 2023-05-01 DIAGNOSIS — F329 Major depressive disorder, single episode, unspecified: Secondary | ICD-10-CM | POA: Diagnosis not present

## 2023-05-01 DIAGNOSIS — F411 Generalized anxiety disorder: Secondary | ICD-10-CM | POA: Diagnosis not present

## 2023-05-01 DIAGNOSIS — F4310 Post-traumatic stress disorder, unspecified: Secondary | ICD-10-CM | POA: Diagnosis not present

## 2023-07-17 ENCOUNTER — Encounter (HOSPITAL_BASED_OUTPATIENT_CLINIC_OR_DEPARTMENT_OTHER): Payer: Self-pay

## 2023-07-17 DIAGNOSIS — R5383 Other fatigue: Secondary | ICD-10-CM

## 2023-07-17 DIAGNOSIS — R0683 Snoring: Secondary | ICD-10-CM

## 2024-01-21 ENCOUNTER — Ambulatory Visit: Admitting: Orthopedic Surgery

## 2024-01-24 ENCOUNTER — Encounter (HOSPITAL_COMMUNITY): Admitting: Occupational Therapy

## 2024-01-31 ENCOUNTER — Encounter (HOSPITAL_COMMUNITY): Payer: Self-pay

## 2024-01-31 ENCOUNTER — Other Ambulatory Visit: Payer: Self-pay

## 2024-01-31 ENCOUNTER — Ambulatory Visit (HOSPITAL_COMMUNITY): Attending: Family Medicine

## 2024-01-31 DIAGNOSIS — M25511 Pain in right shoulder: Secondary | ICD-10-CM | POA: Insufficient documentation

## 2024-01-31 DIAGNOSIS — Z7409 Other reduced mobility: Secondary | ICD-10-CM | POA: Insufficient documentation

## 2024-01-31 DIAGNOSIS — G8929 Other chronic pain: Secondary | ICD-10-CM | POA: Insufficient documentation

## 2024-01-31 DIAGNOSIS — M5386 Other specified dorsopathies, lumbar region: Secondary | ICD-10-CM | POA: Diagnosis present

## 2024-01-31 DIAGNOSIS — R531 Weakness: Secondary | ICD-10-CM | POA: Insufficient documentation

## 2024-01-31 DIAGNOSIS — M25512 Pain in left shoulder: Secondary | ICD-10-CM | POA: Diagnosis present

## 2024-01-31 DIAGNOSIS — R29898 Other symptoms and signs involving the musculoskeletal system: Secondary | ICD-10-CM | POA: Insufficient documentation
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
## 2024-02-07 ENCOUNTER — Ambulatory Visit (HOSPITAL_COMMUNITY): Admitting: Occupational Therapy

## 2024-02-07 ENCOUNTER — Encounter (HOSPITAL_COMMUNITY): Payer: Self-pay | Admitting: Occupational Therapy

## 2024-02-07 ENCOUNTER — Other Ambulatory Visit: Payer: Self-pay

## 2024-02-07 ENCOUNTER — Ambulatory Visit (HOSPITAL_COMMUNITY): Admitting: Physical Therapy

## 2024-02-07 DIAGNOSIS — R29898 Other symptoms and signs involving the musculoskeletal system: Secondary | ICD-10-CM

## 2024-02-07 DIAGNOSIS — R531 Weakness: Secondary | ICD-10-CM

## 2024-02-07 DIAGNOSIS — G8929 Other chronic pain: Secondary | ICD-10-CM

## 2024-02-07 DIAGNOSIS — M5386 Other specified dorsopathies, lumbar region: Secondary | ICD-10-CM
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
## 2024-02-07 NOTE — Patient Instructions (Signed)
1) Seated Row   Sit up straight with elbows by your sides. Pull back with shoulders/elbows, keeping forearms straight, as if pulling back on the reins of a horse. Squeeze shoulder blades together. Repeat _10-15__times, __2-3__sets/day    2) Shoulder Elevation    Sit up straight with arms by your sides. Slowly bring your shoulders up towards your ears. Repeat_10-15__times, __2-3__ sets/day    3) Shoulder Extension    Sit up straight with both arms by your side, draw your arms back behind your waist. Keep your elbows straight. Repeat __10-15__times, __2-3__sets/day.       

## 2024-02-08 ENCOUNTER — Encounter: Payer: Self-pay | Admitting: Orthopedic Surgery

## 2024-02-08 ENCOUNTER — Ambulatory Visit: Admitting: Orthopedic Surgery

## 2024-02-08 VITALS — BP 128/89 | HR 70 | Ht 66.0 in | Wt 224.0 lb

## 2024-02-08 DIAGNOSIS — M545 Low back pain, unspecified: Secondary | ICD-10-CM | POA: Diagnosis not present

## 2024-02-08 DIAGNOSIS — G8929 Other chronic pain: Secondary | ICD-10-CM | POA: Diagnosis not present
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
## 2024-02-14 ENCOUNTER — Telehealth (HOSPITAL_COMMUNITY): Payer: Self-pay

## 2024-02-14 ENCOUNTER — Encounter (HOSPITAL_COMMUNITY)

## 2024-02-14 NOTE — Telephone Encounter (Signed)
 No show, called and left message concerning missed apt today. Included next apt date and time with contact number included. Requested pt to call and cancel/reschedule if unable to make it in the future.   Augustin Mclean, LPTA/CLT; [facility] [phone]

## 2024-02-27 ENCOUNTER — Encounter (HOSPITAL_COMMUNITY)

## 2024-02-27 ENCOUNTER — Encounter (HOSPITAL_COMMUNITY): Admitting: Occupational Therapy

## 2024-02-28 ENCOUNTER — Encounter (HOSPITAL_COMMUNITY): Admitting: Physical Therapy

## 2024-03-04 ENCOUNTER — Encounter (HOSPITAL_COMMUNITY): Admitting: Physical Therapy

## 2024-03-04 ENCOUNTER — Encounter (HOSPITAL_COMMUNITY): Admitting: Occupational Therapy

## 2024-03-07 ENCOUNTER — Encounter (HOSPITAL_COMMUNITY)

## 2024-03-07 ENCOUNTER — Encounter (HOSPITAL_COMMUNITY): Admitting: Occupational Therapy

## 2024-03-11 ENCOUNTER — Encounter (HOSPITAL_COMMUNITY)

## 2024-03-11 ENCOUNTER — Encounter (HOSPITAL_COMMUNITY): Admitting: Occupational Therapy

## 2024-03-13 ENCOUNTER — Encounter (HOSPITAL_COMMUNITY): Admitting: Occupational Therapy

## 2024-03-13 ENCOUNTER — Encounter (HOSPITAL_COMMUNITY)

## 2024-03-21 ENCOUNTER — Encounter (HOSPITAL_COMMUNITY): Admitting: Occupational Therapy

## 2024-03-26 ENCOUNTER — Encounter (HOSPITAL_BASED_OUTPATIENT_CLINIC_OR_DEPARTMENT_OTHER): Payer: Self-pay | Admitting: Internal Medicine

## 2024-03-26 DIAGNOSIS — R0683 Snoring: Secondary | ICD-10-CM

## 2024-03-28 ENCOUNTER — Encounter (HOSPITAL_COMMUNITY): Admitting: Occupational Therapy

## 2024-04-04 ENCOUNTER — Encounter (HOSPITAL_COMMUNITY): Admitting: Occupational Therapy

## 2024-07-23 ENCOUNTER — Encounter: Payer: Self-pay | Admitting: Orthopedic Surgery

## 2024-07-23 ENCOUNTER — Ambulatory Visit: Admitting: Orthopedic Surgery

## 2024-07-23 DIAGNOSIS — M545 Low back pain, unspecified: Secondary | ICD-10-CM

## 2024-07-23 DIAGNOSIS — M5126 Other intervertebral disc displacement, lumbar region: Secondary | ICD-10-CM | POA: Diagnosis not present

## 2024-07-23 DIAGNOSIS — G8929 Other chronic pain: Secondary | ICD-10-CM | POA: Diagnosis not present

## 2024-07-23 MED ORDER — IBUPROFEN 800 MG PO TABS: 800.0000 mg | ORAL_TABLET | Freq: Three times a day (TID) | ORAL | 1 refills | Status: AC | PRN

## 2024-07-23 MED ORDER — GABAPENTIN 300 MG PO CAPS: 300.0000 mg | ORAL_CAPSULE | Freq: Three times a day (TID) | ORAL | 5 refills | Status: AC

## 2024-07-23 MED ORDER — TIZANIDINE HCL 4 MG PO TABS: 4.0000 mg | ORAL_TABLET | Freq: Four times a day (QID) | ORAL | 1 refills | Status: AC | PRN

## 2024-07-23 NOTE — Progress Notes (Signed)
° ° °  07/23/2024   Chief Complaint  Patient presents with   Back Pain    No diagnosis found.  What pharmacy do you use ? __WM 14_________________________  DOI/DOS/ Date: ongoing  Did you get better, worse or no change (Answer below)   Unchanged has pain left leg / can't bend or squat without pain    Has 2 muscle relaxers and using ibuprofen  helps had injection at hospital that helped  [facility] for physical therapy with no relief

## 2024-07-23 NOTE — Addendum Note (Signed)
 Addended byBETHA JENEAN GREIG LELON on: 07/23/2024 10:05 AM   Modules accepted: Orders
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
## 2024-07-23 NOTE — Patient Instructions (Addendum)
 While we are working on your approval for MRI please go ahead and call to schedule your appointment with [facility] within at least one (1) week.   Central Scheduling [phone]   We are referring you to [facility] from [facility] address is [address] [place] The phone number is [phone]  The office will call you with an appointment Dr.Moore   FOR BACK PAIN   TAKE   TYLENOL  500 MG EVERY 6 HRS  GABAPENTIN 300 MG EVERY 8 HRS  IBUPROFEN  800 MG EVERY 8 HRS  MUSCLE RELAXER TIZANIDINE  EVERY 6 HRS  HEATING PAD 30 MIN EVERY 6HRS

## 2024-07-28 ENCOUNTER — Ambulatory Visit: Admitting: Orthopedic Surgery

## 2024-07-29 ENCOUNTER — Ambulatory Visit (HOSPITAL_COMMUNITY)
Admission: RE | Admit: 2024-07-29 | Discharge: 2024-07-29 | Attending: Orthopedic Surgery | Admitting: Orthopedic Surgery

## 2024-07-29 DIAGNOSIS — G8929 Other chronic pain: Secondary | ICD-10-CM | POA: Insufficient documentation

## 2024-07-29 DIAGNOSIS — M545 Low back pain, unspecified: Secondary | ICD-10-CM | POA: Diagnosis present

## 2024-07-29 DIAGNOSIS — M5127 Other intervertebral disc displacement, lumbosacral region: Secondary | ICD-10-CM | POA: Diagnosis not present

## 2024-08-08 ENCOUNTER — Encounter: Payer: Self-pay | Admitting: Orthopedic Surgery

## 2024-08-08 ENCOUNTER — Other Ambulatory Visit (INDEPENDENT_AMBULATORY_CARE_PROVIDER_SITE_OTHER): Payer: Self-pay

## 2024-08-08 ENCOUNTER — Ambulatory Visit: Admitting: Orthopedic Surgery

## 2024-08-08 VITALS — BP 110/77 | HR 96 | Ht 66.0 in | Wt 232.0 lb

## 2024-08-08 DIAGNOSIS — M545 Low back pain, unspecified: Secondary | ICD-10-CM | POA: Diagnosis not present

## 2024-08-08 DIAGNOSIS — M5416 Radiculopathy, lumbar region: Secondary | ICD-10-CM | POA: Diagnosis not present

## 2024-08-08 DIAGNOSIS — G8929 Other chronic pain: Secondary | ICD-10-CM

## 2024-08-08 MED ORDER — PREDNISONE 10 MG PO TABS: 10.0000 mg | ORAL_TABLET | Freq: Every day | ORAL | 0 refills | Status: AC

## 2024-08-08 MED ORDER — METHYLPREDNISOLONE 4 MG PO TBPK: ORAL_TABLET | ORAL | 0 refills | Status: DC

## 2024-08-08 NOTE — Progress Notes (Signed)
 Orthopedic Spine Surgery Office Note  Assessment: Patient is a 28 y.o. female with low back pain that radiates into the left posterolateral thigh. Has a small left-sided paracentral disc herniation at L5/S1   Plan: -Explained that initially conservative treatment is tried as a significant number of patients may experience relief with these treatment modalities. Discussed that the conservative treatments include:  -activity modification  -physical therapy  -over the counter pain medications  -medrol  dosepak  -lumbar steroid injections -Patient has tried tylenol , ibuprofen , gabapentin , zanaflex , intramuscular steroid injection -Recommended Medrol  Dosepak which was prescribed to her today.  Discussed a transforaminal injection as another option.  Referral provided to her today -Patient should return to office in 6 weeks, x-rays at next visit: none   Patient expressed understanding of the plan and all questions were answered to the patient's satisfaction.   ___________________________________________________________________________   History:  Patient is a 28 y.o. female who presents today for lumbar spine.  Patient has had about 4 weeks of low back pain that radiates into the left lower extremity.  She initially felt it in her low back going into the posterolateral thigh and leg.  The pain was initially severe.  It has gotten better with time.  She is still having low back pain and pain in the posterolateral thigh.  It is not as severe as it was.  She is no longer having pain past the knee.  She has no right-sided symptoms.  There was no trauma or injury that preceded the onset of the pain.   Weakness: denies Symptoms of imbalance: denies Paresthesias and numbness: denies Bowel or bladder incontinence: denies Saddle anesthesia: denies  Treatments tried: tylenol , ibuprofen , gabapentin , zanaflex , intramuscular steroid injection  Review of systems: Denies fevers and chills, night  sweats, unexplained weight loss, history of cancer, pain that wakes them at night  Past medical history: Depression/anxiety  Allergies: NKDA  Past surgical history:  Breast reduction Hernia repair  Social history: Denies use of nicotine product (smoking, vaping, patches, smokeless) Alcohol use: denies Denies recreational drug use   Physical Exam:  BMI of 37.5  General: no acute distress, appears stated age Neurologic: alert, answering questions appropriately, following commands Respiratory: unlabored breathing on room air, symmetric chest rise Psychiatric: appropriate affect, normal cadence to speech   MSK (spine):  -Strength exam      Left  Right EHL    5/5  5/5 TA    5/5  5/5 GSC    5/5  5/5 Knee extension  5/5  5/5 Hip flexion   5/5  5/5  -Sensory exam    Sensation intact to light touch in L3-S1 nerve distributions of bilateral lower extremities  -Achilles DTR: 2/4 on the left, 2/4 on the right -Patellar tendon DTR: 2/4 on the left, 2/4 on the right  -Straight leg raise: Negative bilaterally -Clonus: no beats bilaterally  -Left hip exam: No pain through range of motion -Right hip exam: No pain through range of motion  Imaging: XRs of the lumbar spine from 08/08/2024 were independently reviewed and interpreted, showing no significant degenerative changes.  No evidence of instability on flexion/extension views.  No fracture or dislocation seen.  MRI of the lumbar spine from 07/29/2024 was independently reviewed and interpreted, showing a small left paracentral disc herniation at L5/S1 in the area of the S1 nerve root.  No other neural compression seen.   Patient name: Joan Andrews Patient MRN: [id number] Date of visit: 08/08/2024

## 2024-08-22 ENCOUNTER — Telehealth: Payer: Self-pay

## 2024-08-22 NOTE — Telephone Encounter (Signed)
 Pre Procedural Valium 

## 2024-08-25 ENCOUNTER — Other Ambulatory Visit: Payer: Self-pay | Admitting: Physical Medicine and Rehabilitation

## 2024-08-25 DIAGNOSIS — M5416 Radiculopathy, lumbar region: Secondary | ICD-10-CM

## 2024-08-25 MED ORDER — DIAZEPAM 5 MG PO TABS: ORAL_TABLET | ORAL | 0 refills | Status: AC

## 2024-08-27 ENCOUNTER — Encounter: Payer: Self-pay | Admitting: Orthopedic Surgery

## 2024-08-27 MED ORDER — METHYLPREDNISOLONE 4 MG PO TBPK: ORAL_TABLET | ORAL | 0 refills | Status: AC

## 2024-09-05 ENCOUNTER — Encounter: Payer: Self-pay | Admitting: Orthopedic Surgery

## 2024-09-05 DIAGNOSIS — M5416 Radiculopathy, lumbar region: Secondary | ICD-10-CM

## 2024-09-05 NOTE — ED Provider Notes (Signed)
 "                                                                                   Emergency Department Provider Note    ED Clinical Impression   Final diagnoses:  Sciatica of left side (Primary)    ED Assessment/Plan    Condition: Stable Disposition: Discharge  This chart has been completed using Dragon Medical Dictation software, and while attempts have been made to ensure accuracy, certain words and phrases may not be transcribed as intended.   History   Chief Complaint  Patient presents with   Pain    Left side sciatica pain   HPI  Joan Andrews is a 29 y.o. female  who presents today to the  emergency department complaining of left low back/sciatic pain.  Patient states she has a history of sciatica and was scheduled for epidural injection coming up shortly.  She states that the pain got increased today so she denies some relief.  She denies any bowel or bladder incontinence.  Pain is worse with standing for prolonged periods of time.    Allergies: has no known allergies. Medications: is not on any long-term medications. PMHx:  has a past medical history of Sciatic leg pain. PSHx:  has a past surgical history that includes Breast surgery and Hernia repair. SocHx:  reports that she has never smoked. She has never used smokeless tobacco. She reports that she does not drink alcohol and does not use drugs. Allergies, Medications, Medical, Surgical, and Social History were reviewed as documented above.   Social Drivers of Health with Concerns   Food Insecurity: Not on file  Transportation Needs: Not on file  Alcohol Use: Not on file  Housing: Not on file  Physical Activity: Not on file  Utilities: Not on file  Stress: Not on file  Interpersonal Safety: Not on file  Substance Use: Not on file (06/19/2023)  Intimate Partner Violence: Not on file  Social Connections: Not on file  Financial Resource Strain: Not on file  Health Literacy: Not on file   Internet Connectivity: Not on file     Review Of Systems  Review of Systems  Constitutional:  Negative for fever.  HENT:  Negative for congestion.   Respiratory:  Negative for chest tightness and shortness of breath.   Cardiovascular:  Negative for chest pain.  Gastrointestinal:  Negative for abdominal pain.  Musculoskeletal:        Left sciatic nerve pain  Skin:  Negative for color change.  Psychiatric/Behavioral:  Negative for behavioral problems.   All other systems reviewed and are negative.   Physical Exam   BP 120/91   Pulse 87   Temp 36.4 C (97.6 F) (Skin)   Resp 16   Ht 167.6 cm (5' 6)   Wt (!) 104.3 kg (230 lb)   SpO2 100%   BMI 37.12 kg/m   Physical Exam Vitals and nursing note reviewed.  Constitutional:      General: She is not in acute distress. HENT:     Head: Normocephalic.  Eyes:     Conjunctiva/sclera: Conjunctivae normal.  Cardiovascular:     Rate and Rhythm: Regular  rhythm.     Pulses: Normal pulses.     Heart sounds: Normal heart sounds.  Pulmonary:     Effort: No respiratory distress.     Breath sounds: Normal breath sounds.  Abdominal:     General: There is no distension.  Musculoskeletal:        General: No deformity.     Comments: There is tenderness over the left sciatic nerve.  No midline tenderness or step-offs.  No saddle anesthesia.  Pain with straight leg raise at 30 degrees on the left.  Skin:    General: Skin is warm.     Capillary Refill: Capillary refill takes 2 to 3 seconds.     Comments: Normal cap refill.  Neurological:     General: No focal deficit present.  Psychiatric:        Mood and Affect: Mood normal.     ED Course  Medical Decision Making Clinical picture is consistent with sciatica.  6:25 AM Patient feels much better.  She is stable for discharge.  I have reviewed my clinical findings and my clinical impression with the patient. The patient has expressed understanding that at this time there is no  evidence for a more malignant underlying process, but the patient also understands that early in the process of a condition such as this, an initial workup can be falsely reassuring. I have counseled the patient and discussed follow-up with the patient, stressing the importance of appropriate follow-up. I have also counseled the patient to return if worse or any concerns. Routine discharge counseling was given to the patient and the patient understands that worsening, changing or persistent symptoms should prompt an immediate call or follow up with their primary physician or return to the emergency department for reevaluation. Patient has expressed understanding.     Problems Addressed: Sciatica of left side: acute illness or injury that poses a threat to life or bodily functions  Risk Prescription drug management.     Procedures   No results found for this visit on 09/05/24 (from the past 4464 hours).   ED Results No results found for any visits on 09/05/24. No results found.  Medications Administered:  Medications  ketorolac (TORADOL) injection 30 mg (30 mg Intramuscular Given 09/05/24 0553)  methocarbamol  (ROBAXIN ) tablet 500 mg (500 mg Oral Given 09/05/24 0553)  traMADol (ULTRAM) tablet 50 mg (50 mg Oral Given 09/05/24 0553)    Discharge Medications (Medications Prescribed during this  ED visit and Patient's Home Medications) :    Your Medication List     START taking these medications    methocarbamol  500 MG tablet Commonly known as: ROBAXIN  Take 1 tablet (500 mg total) by mouth two (2) times a day for 10 days.       CHANGE how you take these medications    naproxen  500 MG tablet Commonly known as: NAPROSYN  Take 1 tablet (500 mg total) by mouth two (2) times a day as needed (pain). What changed: Another medication with the same name was added. Make sure you understand how and when to take each.   naproxen  500 MG tablet Commonly known as: NAPROSYN  Take 1 tablet  (500 mg total) by mouth two (2) times a day as needed (pain). What changed: You were already taking a medication with the same name, and this prescription was added. Make sure you understand how and when to take each.       ASK your doctor about these medications    methylPREDNISolone  4 mg  tablet Commonly known as: MEDROL  DOSEPACK follow package directions          Cherie Ardeen Hanger, MD 09/05/24 [phone]  "

## 2024-09-08 ENCOUNTER — Encounter: Admitting: Physical Medicine and Rehabilitation

## 2024-09-09 ENCOUNTER — Telehealth: Payer: Self-pay | Admitting: Orthopedic Surgery

## 2024-09-09 NOTE — Telephone Encounter (Signed)
 Orthopedic Office Note  Patient with low back pain that radiates into the left posterior lateral thigh.  She has a small left-sided paracentral disc herniation abutting the transversing S1 nerve root.  Her pain has been present for over 2 months now and has been getting worse.  It got severe to the point that she went to the ER for this pain.  This has been and spite of trying conservative treatments consisting of Tylenol , ibuprofen , gabapentin , Zanaflex , intramuscular steroid injection, Medrol  Dosepak.  Accordingly, discussed surgery as an option with her.  She would like to proceed with that treatment since her pain has not gotten better with time.  Will work to get her on the schedule a time that is mutually convenient.  Patient will next be seen a date of surgery.   Joan DELENA Ada, MD Orthopedic Surgery

## 2024-09-11 ENCOUNTER — Other Ambulatory Visit: Payer: Self-pay

## 2024-09-11 ENCOUNTER — Ambulatory Visit: Admitting: Physical Medicine and Rehabilitation

## 2024-09-11 VITALS — BP 109/72 | HR 80

## 2024-09-11 DIAGNOSIS — M5416 Radiculopathy, lumbar region: Secondary | ICD-10-CM | POA: Diagnosis not present

## 2024-09-11 MED ORDER — METHYLPREDNISOLONE ACETATE 40 MG/ML IJ SUSP
40.0000 mg | Freq: Once | INTRAMUSCULAR | Status: AC
  Administered 2024-09-11: 40 mg

## 2024-09-11 NOTE — Progress Notes (Signed)
 Pain Scale   Average Pain 8 Patient advising she has chronic lower back pain that radiates to left leg pain is constant without relief        +Driver, -BT, -Dye Allergies.

## 2024-09-15 NOTE — Procedures (Signed)
 S1 Lumbosacral Transforaminal Epidural Steroid Injection - Sub-Pedicular Approach with Fluoroscopic Guidance   Patient: Joan Andrews      Date of Birth: 14-Nov-1995 MRN: [id number] PCP: Pcp, No      Visit Date: 09/11/2024   Universal Protocol:    Date/Time: 02/02/266:17 PM  Consent Given By: the patient  Position:  PRONE  Additional Comments: Vital signs were monitored before and after the procedure. Patient was prepped and draped in the usual sterile fashion. The correct patient, procedure, and site was verified.   Injection Procedure Details:  Procedure Site One Meds Administered:  Meds ordered this encounter  Medications   methylPREDNISolone  acetate (DEPO-MEDROL ) injection 40 mg    Laterality: Left  Location/Site:  S1 Foramen   Needle size: 22 ga.  Needle type: Spinal  Needle Placement: Transforaminal  Findings:   -Comments: Excellent flow of contrast along the nerve, nerve root and into the epidural space.  Epidurogram: Contrast epidurogram showed no nerve root cut off or restricted flow pattern.  Procedure Details: After squaring off the sacral end-plate to get a true AP view, the C-arm was positioned so that the best possible view of the S1 foramen was visualized. The soft tissues overlying this structure were infiltrated with 2-3 ml. of 1% Lidocaine  without Epinephrine .    The spinal needle was inserted toward the target using a trajectory view along the fluoroscope beam.  Under AP and lateral visualization, the needle was advanced so it did not puncture dura. Biplanar projections were used to confirm position. Aspiration was confirmed to be negative for CSF and/or blood. A 1-2 ml. volume of Isovue-250 was injected and flow of contrast was noted at each level. Radiographs were obtained for documentation purposes.   After attaining the desired flow of contrast documented above, a 0.5 to 1.0 ml test dose of 0.25% Marcaine  was injected into each  respective transforaminal space.  The patient was observed for 90 seconds post injection.  After no sensory deficits were reported, and normal lower extremity motor function was noted,   the above injectate was administered so that equal amounts of the injectate were placed at each foramen (level) into the transforaminal epidural space.   Additional Comments:  The patient tolerated the procedure well Dressing: Band-Aid with 2 x 2 sterile gauze    Post-procedure details: Patient was observed during the procedure. Post-procedure instructions were reviewed.  Patient left the clinic in stable condition.

## 2024-09-22 ENCOUNTER — Ambulatory Visit: Admitting: Orthopedic Surgery

## 2024-12-16 ENCOUNTER — Ambulatory Visit (HOSPITAL_COMMUNITY): Admit: 2024-12-16 | Admitting: Orthopedic Surgery

## 2024-12-29 ENCOUNTER — Encounter: Admitting: Orthopedic Surgery
